# Patient Record
Sex: Male | Born: 1945
Health system: Southern US, Community
[De-identification: ages and names within clinical notes are randomized; demographics above are authoritative.]

## PROBLEM LIST (undated history)

## (undated) DIAGNOSIS — K851 Biliary acute pancreatitis without necrosis or infection: Secondary | ICD-10-CM

## (undated) DIAGNOSIS — M109 Gout, unspecified: Secondary | ICD-10-CM

## (undated) DIAGNOSIS — J45998 Other asthma: Secondary | ICD-10-CM

## (undated) DIAGNOSIS — K859 Acute pancreatitis without necrosis or infection, unspecified: Secondary | ICD-10-CM

## (undated) DIAGNOSIS — K219 Gastro-esophageal reflux disease without esophagitis: Secondary | ICD-10-CM

## (undated) DIAGNOSIS — K6389 Other specified diseases of intestine: Secondary | ICD-10-CM

## (undated) DIAGNOSIS — J302 Other seasonal allergic rhinitis: Secondary | ICD-10-CM

## (undated) HISTORY — PX: CIRCUMCISION: SUR203

## (undated) HISTORY — PX: PANCREAS SURGERY: SHX731

---

## 1988-05-22 DIAGNOSIS — M109 Gout, unspecified: Secondary | ICD-10-CM

## 1988-05-22 HISTORY — DX: Gout, unspecified: M10.9

## 2008-06-03 ENCOUNTER — Encounter: Admission: RE | Admit: 2008-06-03 | Discharge: 2008-06-03 | Payer: Self-pay | Admitting: Internal Medicine

## 2011-09-08 DIAGNOSIS — Z0000 Encounter for general adult medical examination without abnormal findings: Secondary | ICD-10-CM | POA: Diagnosis not present

## 2011-09-08 DIAGNOSIS — Z23 Encounter for immunization: Secondary | ICD-10-CM | POA: Diagnosis not present

## 2011-09-08 DIAGNOSIS — E7800 Pure hypercholesterolemia, unspecified: Secondary | ICD-10-CM | POA: Diagnosis not present

## 2011-09-08 DIAGNOSIS — Z125 Encounter for screening for malignant neoplasm of prostate: Secondary | ICD-10-CM | POA: Diagnosis not present

## 2011-09-11 DIAGNOSIS — N390 Urinary tract infection, site not specified: Secondary | ICD-10-CM | POA: Diagnosis not present

## 2012-05-22 HISTORY — PX: EYE EXAMINATION UNDER ANESTHESIA W/ RETINAL CRYOTHERAPY AND RETINAL LASER: SHX1561

## 2012-08-12 DIAGNOSIS — H538 Other visual disturbances: Secondary | ICD-10-CM | POA: Diagnosis not present

## 2012-08-13 DIAGNOSIS — H33329 Round hole, unspecified eye: Secondary | ICD-10-CM | POA: Diagnosis not present

## 2012-08-13 DIAGNOSIS — H33319 Horseshoe tear of retina without detachment, unspecified eye: Secondary | ICD-10-CM | POA: Diagnosis not present

## 2012-09-19 DIAGNOSIS — Z125 Encounter for screening for malignant neoplasm of prostate: Secondary | ICD-10-CM | POA: Diagnosis not present

## 2012-09-19 DIAGNOSIS — Z0000 Encounter for general adult medical examination without abnormal findings: Secondary | ICD-10-CM | POA: Diagnosis not present

## 2012-09-19 DIAGNOSIS — E7800 Pure hypercholesterolemia, unspecified: Secondary | ICD-10-CM | POA: Diagnosis not present

## 2012-09-20 DIAGNOSIS — H33329 Round hole, unspecified eye: Secondary | ICD-10-CM | POA: Diagnosis not present

## 2012-09-26 DIAGNOSIS — Z0000 Encounter for general adult medical examination without abnormal findings: Secondary | ICD-10-CM | POA: Diagnosis not present

## 2012-09-26 DIAGNOSIS — Z6829 Body mass index (BMI) 29.0-29.9, adult: Secondary | ICD-10-CM | POA: Diagnosis not present

## 2012-09-26 DIAGNOSIS — E7800 Pure hypercholesterolemia, unspecified: Secondary | ICD-10-CM | POA: Diagnosis not present

## 2012-09-27 DIAGNOSIS — Z0000 Encounter for general adult medical examination without abnormal findings: Secondary | ICD-10-CM | POA: Diagnosis not present

## 2012-12-04 DIAGNOSIS — M545 Low back pain, unspecified: Secondary | ICD-10-CM | POA: Diagnosis not present

## 2013-07-08 ENCOUNTER — Emergency Department (HOSPITAL_COMMUNITY): Payer: Medicare Other

## 2013-07-08 ENCOUNTER — Inpatient Hospital Stay (HOSPITAL_COMMUNITY)
Admission: EM | Admit: 2013-07-08 | Discharge: 2013-07-14 | DRG: 440 | Disposition: A | Discharge status: Home / Self Care | Payer: Medicare Other | Attending: Internal Medicine | Admitting: Internal Medicine

## 2013-07-08 ENCOUNTER — Encounter (HOSPITAL_COMMUNITY): Payer: Self-pay | Admitting: Emergency Medicine

## 2013-07-08 DIAGNOSIS — R112 Nausea with vomiting, unspecified: Secondary | ICD-10-CM | POA: Diagnosis present

## 2013-07-08 DIAGNOSIS — K5900 Constipation, unspecified: Secondary | ICD-10-CM | POA: Diagnosis not present

## 2013-07-08 DIAGNOSIS — K859 Acute pancreatitis without necrosis or infection, unspecified: Secondary | ICD-10-CM | POA: Diagnosis not present

## 2013-07-08 DIAGNOSIS — E663 Overweight: Secondary | ICD-10-CM | POA: Diagnosis present

## 2013-07-08 DIAGNOSIS — F1010 Alcohol abuse, uncomplicated: Secondary | ICD-10-CM | POA: Diagnosis present

## 2013-07-08 DIAGNOSIS — R1033 Periumbilical pain: Secondary | ICD-10-CM | POA: Diagnosis present

## 2013-07-08 DIAGNOSIS — K2970 Gastritis, unspecified, without bleeding: Secondary | ICD-10-CM | POA: Diagnosis not present

## 2013-07-08 DIAGNOSIS — R509 Fever, unspecified: Secondary | ICD-10-CM | POA: Diagnosis not present

## 2013-07-08 DIAGNOSIS — J45909 Unspecified asthma, uncomplicated: Secondary | ICD-10-CM | POA: Diagnosis present

## 2013-07-08 DIAGNOSIS — R52 Pain, unspecified: Secondary | ICD-10-CM

## 2013-07-08 DIAGNOSIS — I517 Cardiomegaly: Secondary | ICD-10-CM | POA: Diagnosis not present

## 2013-07-08 DIAGNOSIS — R10819 Abdominal tenderness, unspecified site: Secondary | ICD-10-CM | POA: Diagnosis not present

## 2013-07-08 DIAGNOSIS — R935 Abnormal findings on diagnostic imaging of other abdominal regions, including retroperitoneum: Secondary | ICD-10-CM | POA: Diagnosis present

## 2013-07-08 HISTORY — DX: Gout, unspecified: M10.9

## 2013-07-08 HISTORY — DX: Other asthma: J45.998

## 2013-07-08 HISTORY — DX: Acute pancreatitis without necrosis or infection, unspecified: K85.90

## 2013-07-08 LAB — CBC WITH DIFFERENTIAL/PLATELET
Basophils Absolute: 0 10*3/uL (ref 0.0–0.1)
Basophils Relative: 0 % (ref 0–1)
Eosinophils Absolute: 0 10*3/uL (ref 0.0–0.7)
Eosinophils Relative: 0 % (ref 0–5)
HCT: 44.6 % (ref 39.0–52.0)
Hemoglobin: 15.9 g/dL (ref 13.0–17.0)
Lymphocytes Relative: 7 % — ABNORMAL LOW (ref 12–46)
Lymphs Abs: 0.8 10*3/uL (ref 0.7–4.0)
MCH: 33.1 pg (ref 26.0–34.0)
MCHC: 35.7 g/dL (ref 30.0–36.0)
MCV: 92.9 fL (ref 78.0–100.0)
Monocytes Absolute: 0.4 10*3/uL (ref 0.1–1.0)
Monocytes Relative: 4 % (ref 3–12)
Neutro Abs: 9.7 10*3/uL — ABNORMAL HIGH (ref 1.7–7.7)
Neutrophils Relative %: 89 % — ABNORMAL HIGH (ref 43–77)
Platelets: 171 10*3/uL (ref 150–400)
RBC: 4.8 MIL/uL (ref 4.22–5.81)
RDW: 13.3 % (ref 11.5–15.5)
WBC: 10.9 10*3/uL — ABNORMAL HIGH (ref 4.0–10.5)

## 2013-07-08 LAB — URINALYSIS, ROUTINE W REFLEX MICROSCOPIC
Glucose, UA: NEGATIVE mg/dL
Hgb urine dipstick: NEGATIVE
Ketones, ur: NEGATIVE mg/dL
Leukocytes, UA: NEGATIVE
Nitrite: NEGATIVE
Protein, ur: NEGATIVE mg/dL
Specific Gravity, Urine: 1.036 — ABNORMAL HIGH (ref 1.005–1.030)
Urobilinogen, UA: 2 mg/dL — ABNORMAL HIGH (ref 0.0–1.0)
pH: 6 (ref 5.0–8.0)

## 2013-07-08 LAB — COMPREHENSIVE METABOLIC PANEL
ALT: 65 U/L — ABNORMAL HIGH (ref 0–53)
AST: 85 U/L — ABNORMAL HIGH (ref 0–37)
Albumin: 3.9 g/dL (ref 3.5–5.2)
Alkaline Phosphatase: 79 U/L (ref 39–117)
BUN: 12 mg/dL (ref 6–23)
CO2: 27 mEq/L (ref 19–32)
Calcium: 9.4 mg/dL (ref 8.4–10.5)
Chloride: 107 mEq/L (ref 96–112)
Creatinine, Ser: 1.25 mg/dL (ref 0.50–1.35)
GFR calc Af Amer: 67 mL/min — ABNORMAL LOW (ref >90)
GFR calc non Af Amer: 58 mL/min — ABNORMAL LOW (ref >90)
Glucose, Bld: 123 mg/dL — ABNORMAL HIGH (ref 70–99)
Potassium: 4.1 mEq/L (ref 3.7–5.3)
Sodium: 145 mEq/L (ref 137–147)
Total Bilirubin: 1.4 mg/dL — ABNORMAL HIGH (ref 0.3–1.2)
Total Protein: 7.7 g/dL (ref 6.0–8.3)

## 2013-07-08 LAB — LIPASE, BLOOD: Lipase: >3000 U/L — ABNORMAL HIGH (ref 11–59)

## 2013-07-08 LAB — CG4 I-STAT (LACTIC ACID): Lactic Acid, Venous: 1.5 mmol/L (ref 0.5–2.2)

## 2013-07-08 LAB — TRIGLYCERIDES: Triglycerides: 153 mg/dL — ABNORMAL HIGH (ref <150)

## 2013-07-08 MED ORDER — POTASSIUM CHLORIDE IN NACL 20-0.9 MEQ/L-% IV SOLN
INTRAVENOUS | Status: AC
  Administered 2013-07-09: 100 mL/h via INTRAVENOUS
  Filled 2013-07-08 (×2): qty 1000

## 2013-07-08 MED ORDER — ONDANSETRON HCL 4 MG PO TABS: 4.0000 mg | ORAL_TABLET | Freq: Four times a day (QID) | ORAL | Status: DC | PRN

## 2013-07-08 MED ORDER — ONDANSETRON HCL 4 MG/2ML IJ SOLN
4.0000 mg | Freq: Once | INTRAMUSCULAR | Status: AC
  Administered 2013-07-08: 4 mg via INTRAVENOUS
  Filled 2013-07-08: qty 2

## 2013-07-08 MED ORDER — MORPHINE SULFATE 4 MG/ML IJ SOLN
4.0000 mg | Freq: Once | INTRAMUSCULAR | Status: AC
  Administered 2013-07-08: 4 mg via INTRAVENOUS
  Filled 2013-07-08: qty 1

## 2013-07-08 MED ORDER — THIAMINE HCL 100 MG/ML IJ SOLN
Freq: Once | INTRAVENOUS | Status: AC
  Administered 2013-07-08: via INTRAVENOUS
  Filled 2013-07-08: qty 1000

## 2013-07-08 MED ORDER — HYDROMORPHONE HCL PF 1 MG/ML IJ SOLN
1.0000 mg | INTRAMUSCULAR | Status: DC | PRN
  Administered 2013-07-09 – 2013-07-12 (×9): 1 mg via INTRAVENOUS
  Filled 2013-07-08 (×10): qty 1

## 2013-07-08 MED ORDER — IOHEXOL 300 MG/ML  SOLN
100.0000 mL | Freq: Once | INTRAMUSCULAR | Status: AC | PRN
  Administered 2013-07-08: 100 mL via INTRAVENOUS

## 2013-07-08 MED ORDER — SODIUM CHLORIDE 0.9 % IV BOLUS (SEPSIS)
1000.0000 mL | Freq: Once | INTRAVENOUS | Status: AC
  Administered 2013-07-08: 1000 mL via INTRAVENOUS

## 2013-07-08 MED ORDER — ENOXAPARIN SODIUM 40 MG/0.4ML ~~LOC~~ SOLN
40.0000 mg | SUBCUTANEOUS | Status: DC
  Administered 2013-07-09 – 2013-07-13 (×5): 40 mg via SUBCUTANEOUS
  Filled 2013-07-08 (×6): qty 0.4

## 2013-07-08 MED ORDER — SODIUM CHLORIDE 0.9 % IV BOLUS (SEPSIS)
1000.0000 mL | Freq: Once | INTRAVENOUS | Status: AC
  Administered 2013-07-09: 1000 mL via INTRAVENOUS

## 2013-07-08 MED ORDER — ALBUTEROL SULFATE (2.5 MG/3ML) 0.083% IN NEBU
3.0000 mL | INHALATION_SOLUTION | Freq: Four times a day (QID) | RESPIRATORY_TRACT | Status: DC | PRN
  Administered 2013-07-10: 3 mL via RESPIRATORY_TRACT
  Filled 2013-07-08: qty 3

## 2013-07-08 MED ORDER — IOHEXOL 300 MG/ML  SOLN
25.0000 mL | INTRAMUSCULAR | Status: AC
  Administered 2013-07-08: 25 mL via ORAL

## 2013-07-08 MED ORDER — ONDANSETRON HCL 4 MG/2ML IJ SOLN: 4.0000 mg | Freq: Four times a day (QID) | INTRAMUSCULAR | Status: DC | PRN

## 2013-07-08 NOTE — Progress Notes (Signed)
Received report from RN. Awaiting arrival of patient

## 2013-07-08 NOTE — Progress Notes (Signed)
Called down for report. RN asked to call back. Awaiting call

## 2013-07-08 NOTE — ED Provider Notes (Signed)
CSN: [id number]     Arrival date & time 07/08/13  1737 History   First MD Initiated Contact with Patient 07/08/13 1737     Chief Complaint  Patient presents with  . Abdominal Pain     (Consider location/radiation/quality/duration/timing/severity/associated sxs/prior Treatment) Patient is a 68 y.o. male presenting with abdominal pain.  Abdominal Pain Pain location:  RLQ Pain quality: aching and squeezing   Pain radiates to:  Does not radiate Pain severity:  Moderate Onset quality:  Gradual Duration:  1 day Timing:  Constant Progression:  Worsening Context: not awakening from sleep, not diet changes, not eating, not laxative use and not previous surgeries   Relieved by: fentanyl by EMS. Worsened by:  Palpation Ineffective treatments:  Eating, cold and belching Associated symptoms: chills, nausea and vomiting ( nbnb)   Associated symptoms: no anorexia, no belching, no chest pain, no cough, no diarrhea, no dysuria, no fatigue, no fever, no flatus, no melena, no shortness of breath and no sore throat     Past Medical History  Diagnosis Date  . Seasonal asthma   . Pancreatitis, acute 07/08/2013  . Gout attack 1990   Past Surgical History  Procedure Laterality Date  . Circumcision  ~ 1974  . Eye examination under anesthesia w/ retinal cryotherapy and retinal laser Right 2014    "for tear"   History reviewed. No pertinent family history. History  Substance Use Topics  . Smoking status: Never Smoker   . Smokeless tobacco: Never Used  . Alcohol Use: 1.8 oz/week    2 Cans of beer, 1 Shots of liquor per week     Comment: 07/08/2013    Review of Systems  Constitutional: Positive for chills. Negative for fever, activity change, appetite change and fatigue.  HENT: Negative for congestion, ear pain, rhinorrhea, sinus pressure and sore throat.   Eyes: Negative for pain and redness.  Respiratory: Negative for cough, chest tightness and shortness of breath.   Cardiovascular:  Negative for chest pain and palpitations.  Gastrointestinal: Positive for nausea, vomiting ( nbnb) and abdominal pain. Negative for diarrhea, melena, abdominal distention, anorexia and flatus.  Genitourinary: Negative for dysuria, flank pain and difficulty urinating.  Musculoskeletal: Negative for back pain, neck pain and neck stiffness.  Skin: Negative for rash and wound.  Neurological: Negative for dizziness, light-headedness, numbness and headaches.  Hematological: Negative for adenopathy.  Psychiatric/Behavioral: Negative for behavioral problems, confusion and agitation.      Allergies  Review of patient's allergies indicates no known allergies.  Home Medications  No current outpatient prescriptions on file. BP 133/84  Pulse 102  Temp(Src) 99.2 F (37.3 C) (Oral)  Resp 20  Ht 6' (1.829 m)  Wt 210 lb (95.255 kg)  BMI 28.47 kg/m2  SpO2 95% Physical Exam  Constitutional: He is oriented to person, place, and time. He appears well-developed and well-nourished. No distress.  HENT:  Head: Normocephalic and atraumatic.  Nose: Nose normal.  Mouth/Throat: Oropharynx is clear and moist.  Eyes: Conjunctivae and EOM are normal. Pupils are equal, round, and reactive to light.  Neck: Normal range of motion. Neck supple. No tracheal deviation present.  Cardiovascular: Normal rate, regular rhythm, normal heart sounds and intact distal pulses.   Pulmonary/Chest: Effort normal and breath sounds normal. No respiratory distress. He has no rales.  Abdominal: Soft. Bowel sounds are normal. He exhibits no distension. There is tenderness ( RLQ). There is no rebound and no guarding.  Pain over Mcburney point. Positive obturator sign. No Rovsing's or psaos  sign  Musculoskeletal: Normal range of motion. He exhibits no edema and no tenderness.  Neurological: He is alert and oriented to person, place, and time.  Skin: Skin is warm and dry.  Psychiatric: He has a normal mood and affect. His behavior is  normal.    ED Course  Procedures  Labs Review Labs Reviewed  CBC WITH DIFFERENTIAL - Abnormal; Notable for the following:    WBC 10.9 (*)    Neutrophils Relative % 89 (*)    Neutro Abs 9.7 (*)    Lymphocytes Relative 7 (*)    All other components within normal limits  COMPREHENSIVE METABOLIC PANEL - Abnormal; Notable for the following:    Glucose, Bld 123 (*)    AST 85 (*)    ALT 65 (*)    Total Bilirubin 1.4 (*)    GFR calc non Af Amer 58 (*)    GFR calc Af Amer 67 (*)    All other components within normal limits  LIPASE, BLOOD - Abnormal; Notable for the following:    Lipase >3000 (*)    All other components within normal limits  URINALYSIS, ROUTINE W REFLEX MICROSCOPIC - Abnormal; Notable for the following:    Color, Urine AMBER (*)    Specific Gravity, Urine 1.036 (*)    Bilirubin Urine SMALL (*)    Urobilinogen, UA 2.0 (*)    All other components within normal limits  TRIGLYCERIDES - Abnormal; Notable for the following:    Triglycerides 153 (*)    All other components within normal limits  COMPREHENSIVE METABOLIC PANEL  CBC  PROTIME-INR  LIPASE, BLOOD  CG4 I-STAT (LACTIC ACID)   Imaging Review Ct Abdomen Pelvis W Contrast  07/08/2013   CLINICAL DATA:  Pain.  EXAM: CT ABDOMEN AND PELVIS WITH CONTRAST  TECHNIQUE: Multidetector CT imaging of the abdomen and pelvis was performed using the standard protocol following bolus administration of intravenous contrast.  CONTRAST:  190mL OMNIPAQUE IOHEXOL 300 MG/ML  SOLN  COMPARISON:  None.  FINDINGS: Liver normal. Spleen normal. Splenic vein and portal vein are patent. Diffuse pancreatic and peripancreatic edema noted with fluid in the anterior perirenal space diffusely. These findings are consistent with pancreatitis. No evidence of biliary distention. No evidence of gallbladder distention.  Adrenals are unremarkable. Simple left renal cyst. Kidneys are otherwise unremarkable. No evidence of hydronephrosis or obstructing ureteral  stone. Bladder is nondistended. Prostate is unremarkable.  Aorta and its branches are patent. No significant inguinal retroperitoneal adenopathy.  Appendix normal. No inflammatory changes in either right or left lower quadrant. Diffuse diverticulosis noted. No evidence of diverticulitis. No bowel distention or free air. The stomach is nondistended. Small hiatal hernia present.  Mild atelectasis and/or infiltrates noted in the lung bases. Borderline cardiomegaly. Coronary artery disease. Bilateral inguinal hernias with herniation of fat noted. Abdominal wall is otherwise intact.  IMPRESSION: 1. Pancreatitis. No evidence of pseudocyst or hemorrhage. No evidence of biliary obstruction. 2. Bibasilar atelectasis and/or infiltrates. 3. Borderline cardiomegaly.  Coronary artery disease.   Electronically Signed   By: Marcello Moores  Register   On: 07/08/2013 19:11      MDM   Final diagnoses:  Abdominal pain, acute, periumbilical  Acute pancreatitis  Nausea & vomiting     68 yo M in NAD AFVSS non toxic appearing presents with one days hx of periumbilical pain which has now localized to his RLQ. Positive obturator sign and pain over mcburneys point. Concern for appendicitis. He denies any dysuria , hematuria or flank pain.  Will obtain labs and CT C/A/P.   Lipase >3000. CT concerning for pancreatitis. Morphine and zofran given. NS hydration. Patient made NPO.   Case dsicusse dwith my attending Dr. Reather Converse.   Patient admitted for further care of pancreatitis.     Ruthell Rummage, MD 07/09/13 Dyann Kief

## 2013-07-08 NOTE — H&P (Addendum)
Chief Complaint:  abd pain  HPI: 68 yo male h/o asthma comes in with one day of abd pain started periumblical then moved to rlq with associated n/v nonbloody.  No fevers.  Still has gallbladder and appendix.  No diarrhea.  Drinks etoh about 3 x a week and had a couple of shots last night.  No medical problems except mild asthma.  No prior h/o pancreatitis.  Does not have pcp.  Feeling much better with zofran and pain meds received in ED.  No recent new medications or illnesses.  Review of Systems:  Positive and negative as per HPI otherwise all other systems are negative  Past Medical History: History reviewed. No pertinent past medical history. History reviewed. No pertinent past surgical history.  Medications: Prior to Admission medications   Medication Sig Start Date End Date Taking? Authorizing Provider  albuterol (PROVENTIL HFA;VENTOLIN HFA) 108 (90 BASE) MCG/ACT inhaler Inhale 1-2 puffs into the lungs every 6 (six) hours as needed for wheezing or shortness of breath.   Yes Historical Provider, MD  aspirin-sod bicarb-citric acid (ALKA-SELTZER) 325 MG TBEF tablet Take 325 mg by mouth daily as needed (for indigestion).   Yes Historical Provider, MD  bismuth subsalicylate (PEPTO BISMOL) 262 MG/15ML suspension Take 30 mLs by mouth every 6 (six) hours as needed for indigestion.   Yes Historical Provider, MD  loratadine (CLARITIN) 10 MG tablet Take 10 mg by mouth daily as needed for allergies.   Yes Historical Provider, MD    Allergies:  No Known Allergies  Social History:  reports that he has never smoked. He does not have any smokeless tobacco history on file. He reports that he drinks alcohol. He reports that he does not use illicit drugs.  Family History: History reviewed. No pertinent family history.  Physical Exam: Filed Vitals:   07/08/13 1815 07/08/13 1830 07/08/13 2015 07/08/13 2130  BP: 130/86 126/103 131/89 122/72  Pulse:  30 91 90  Temp:   98.9 F (37.2 C)   TempSrc:    Oral   Resp: 20 22 14    SpO2:  67% 96% 93%   General appearance: alert, cooperative and no distress Head: Normocephalic, without obvious abnormality, atraumatic Eyes: negative Nose: Nares normal. Septum midline. Mucosa normal. No drainage or sinus tenderness. Neck: no JVD and supple, symmetrical, trachea midline Lungs: clear to auscultation bilaterally Heart: regular rate and rhythm, S1, S2 normal, no murmur, click, rub or gallop Abdomen: soft ttp general, mild distention. pos bs/ no r/g nonacute abd Extremities: extremities normal, atraumatic, no cyanosis or edema Pulses: 2+ and symmetric Skin: Skin color, texture, turgor normal. No rashes or lesions Neurologic: Grossly normal    Labs on Admission:   Recent Labs  07/08/13 1742  NA 145  K 4.1  CL 107  CO2 27  GLUCOSE 123*  BUN 12  CREATININE 1.25  CALCIUM 9.4    Recent Labs  07/08/13 1742  AST 85*  ALT 65*  ALKPHOS 79  BILITOT 1.4*  PROT 7.7  ALBUMIN 3.9    Recent Labs  07/08/13 1742  LIPASE >3000*    Recent Labs  07/08/13 1742  WBC 10.9*  NEUTROABS 9.7*  HGB 15.9  HCT 44.6  MCV 92.9  PLT 171    Radiological Exams on Admission: Ct Abdomen Pelvis W Contrast  07/08/2013   CLINICAL DATA:  Pain.  EXAM: CT ABDOMEN AND PELVIS WITH CONTRAST  TECHNIQUE: Multidetector CT imaging of the abdomen and pelvis was performed using the standard protocol following bolus  administration of intravenous contrast.  CONTRAST:  183mL OMNIPAQUE IOHEXOL 300 MG/ML  SOLN  COMPARISON:  None.  FINDINGS: Liver normal. Spleen normal. Splenic vein and portal vein are patent. Diffuse pancreatic and peripancreatic edema noted with fluid in the anterior perirenal space diffusely. These findings are consistent with pancreatitis. No evidence of biliary distention. No evidence of gallbladder distention.  Adrenals are unremarkable. Simple left renal cyst. Kidneys are otherwise unremarkable. No evidence of hydronephrosis or obstructing  ureteral stone. Bladder is nondistended. Prostate is unremarkable.  Aorta and its branches are patent. No significant inguinal retroperitoneal adenopathy.  Appendix normal. No inflammatory changes in either right or left lower quadrant. Diffuse diverticulosis noted. No evidence of diverticulitis. No bowel distention or free air. The stomach is nondistended. Small hiatal hernia present.  Mild atelectasis and/or infiltrates noted in the lung bases. Borderline cardiomegaly. Coronary artery disease. Bilateral inguinal hernias with herniation of fat noted. Abdominal wall is otherwise intact.  IMPRESSION: 1. Pancreatitis. No evidence of pseudocyst or hemorrhage. No evidence of biliary obstruction. 2. Bibasilar atelectasis and/or infiltrates. 3. Borderline cardiomegaly.  Coronary artery disease.   Electronically Signed   By: Marcello Moores  Register   On: 07/08/2013 19:11    Assessment/Plan  68 yo male with first episode of acute pancreatitis etoh induced vs gallstone pancreatitis  Principal Problem:   Acute pancreatitis- conservative tx.  Give 2 more liters ivf, gotten one already.  Trig level sent off.  Will also obtain abd u/s.  Repeat lfts and lipase in am.  Iv dilaudid and zofran prn.  Npo.  Active Problems:   Abdominal pain, acute, periumbilical   Nausea & vomiting    Dalen,Jermain Curt A 07/08/2013, 10:07 PM   Revaluated pt earlier this am, abd exam is no worse than before, mildly distended, diffuse ttp good bs no r/g nonacute.

## 2013-07-08 NOTE — ED Notes (Signed)
Pt arrived by [facility] for mid abd pain that has been getting progressively worse throughout the day. Now radiates more to RLQ pain and n/v x 2. Received 188mcg fentanyl iv pta.

## 2013-07-08 NOTE — Progress Notes (Signed)
  Pt admitted to the unit. Pt is stable, alert and oriented per baseline. Oriented to room, staff, and call bell. Educated to call for any assistance. Bed in lowest position, call bell within reach- will continue to monitor. 

## 2013-07-09 ENCOUNTER — Inpatient Hospital Stay (HOSPITAL_COMMUNITY): Payer: Medicare Other

## 2013-07-09 DIAGNOSIS — R935 Abnormal findings on diagnostic imaging of other abdominal regions, including retroperitoneum: Secondary | ICD-10-CM | POA: Diagnosis present

## 2013-07-09 DIAGNOSIS — K859 Acute pancreatitis without necrosis or infection, unspecified: Secondary | ICD-10-CM | POA: Diagnosis not present

## 2013-07-09 DIAGNOSIS — R112 Nausea with vomiting, unspecified: Secondary | ICD-10-CM | POA: Diagnosis not present

## 2013-07-09 DIAGNOSIS — R1033 Periumbilical pain: Secondary | ICD-10-CM | POA: Diagnosis not present

## 2013-07-09 LAB — LIPASE, BLOOD: Lipase: 987 U/L — ABNORMAL HIGH (ref 11–59)

## 2013-07-09 LAB — CBC
HCT: 42.4 % (ref 39.0–52.0)
Hemoglobin: 14.7 g/dL (ref 13.0–17.0)
MCH: 32.3 pg (ref 26.0–34.0)
MCHC: 34.7 g/dL (ref 30.0–36.0)
MCV: 93.2 fL (ref 78.0–100.0)
Platelets: 153 10*3/uL (ref 150–400)
RBC: 4.55 MIL/uL (ref 4.22–5.81)
RDW: 13.7 % (ref 11.5–15.5)
WBC: 10.8 10*3/uL — ABNORMAL HIGH (ref 4.0–10.5)

## 2013-07-09 LAB — COMPREHENSIVE METABOLIC PANEL
ALT: 65 U/L — ABNORMAL HIGH (ref 0–53)
AST: 64 U/L — ABNORMAL HIGH (ref 0–37)
Albumin: 3.1 g/dL — ABNORMAL LOW (ref 3.5–5.2)
Alkaline Phosphatase: 62 U/L (ref 39–117)
BUN: 13 mg/dL (ref 6–23)
CO2: 19 mEq/L (ref 19–32)
Calcium: 7.9 mg/dL — ABNORMAL LOW (ref 8.4–10.5)
Chloride: 110 mEq/L (ref 96–112)
Creatinine, Ser: 1.11 mg/dL (ref 0.50–1.35)
GFR calc Af Amer: 77 mL/min — ABNORMAL LOW (ref >90)
GFR calc non Af Amer: 67 mL/min — ABNORMAL LOW (ref >90)
Glucose, Bld: 113 mg/dL — ABNORMAL HIGH (ref 70–99)
Potassium: 4 mEq/L (ref 3.7–5.3)
Sodium: 141 mEq/L (ref 137–147)
Total Bilirubin: 1.1 mg/dL (ref 0.3–1.2)
Total Protein: 6.3 g/dL (ref 6.0–8.3)

## 2013-07-09 LAB — PROTIME-INR
INR: 1.19 (ref 0.00–1.49)
Prothrombin Time: 14.8 seconds (ref 11.6–15.2)

## 2013-07-09 MED ORDER — SINCALIDE 5 MCG IJ SOLR
INTRAMUSCULAR | Status: AC
  Administered 2013-07-09: 1.9 ug via INTRAVENOUS
  Filled 2013-07-09: qty 5

## 2013-07-09 MED ORDER — SINCALIDE 5 MCG IJ SOLR
0.0200 ug/kg | Freq: Once | INTRAMUSCULAR | Status: AC
  Administered 2013-07-09: 1.9 ug via INTRAVENOUS
  Filled 2013-07-09: qty 5

## 2013-07-09 MED ORDER — ACETAMINOPHEN 40 MG HALF SUPP
40.0000 mg | Freq: Four times a day (QID) | RECTAL | Status: DC
  Administered 2013-07-09 – 2013-07-10 (×2): 40 mg via RECTAL
  Filled 2013-07-09 (×11): qty 1

## 2013-07-09 MED ORDER — TECHNETIUM TC 99M MEBROFENIN IV KIT
5.0000 | PACK | Freq: Once | INTRAVENOUS | Status: AC | PRN
  Administered 2013-07-09: 5 via INTRAVENOUS

## 2013-07-09 NOTE — Progress Notes (Signed)
RN received call concerning patient's morning labs. Patient's blood clotted off. Labs will need to be retaken.

## 2013-07-09 NOTE — Progress Notes (Signed)
UR completed. Guadalupe Kerekes RN CCM Case Mgmt phone [phone] 

## 2013-07-09 NOTE — Progress Notes (Signed)
[facility] PROGRESS NOTE  Mason [name] [id number]:[id number] DOB: September 02, 1945 DOA: 07/08/2013 PCP: Myriam Jacobson, MD  Assessment/Plan: Principal Problem:   Acute pancreatitis: Patient and wife, alcohol intake is minimal. Given findings of gallbladder wall thickening on ultrasound, suspect this is gallbladder related. Ordered HIDA scan. Awaiting lipid panel. Lipase levels down into 900s. Patient clinically improving. Continue IV fluids, nausea and pain medication Active Problems:   Abdominal pain, acute, periumbilical: Secondary pancreatitis   Nausea & vomiting: Secondary pancreatitis  Code Status: Full code Family Communication: Spoke with patient's wife at the bedside.  Disposition Plan: Here for a few more days until lipase level normalized and patient can eat   Consultants:  None  Procedures:  HIDA scan pending  Antibiotics:  None   HPI/Subjective: Patient complains of mild midepigastric abdominal pain with little radiation to the back. Much better than yesterday. No other complaints.  Objective: Filed Vitals:   07/09/13 0531  BP: 122/72  Pulse: 81  Temp: 99 F (37.2 C)  Resp: 20   No intake or output data in the 24 hours ending 07/09/13 1142 Filed Weights   07/08/13 2320  Weight: 95.255 kg (210 lb)    Exam:   General:  Alert and oriented x3, no acute distress  Cardiovascular: Regular rate and rhythm, S1-S2  Respiratory: Clear auscultation bilaterally  Abdomen: Soft, minimal distention, nonspecific generalized tenderness  Musculoskeletal: No clubbing or cyanosis or edema   Data Reviewed: Basic Metabolic Panel:  Recent Labs Lab 07/08/13 1742 07/09/13 0515  NA 145 141  K 4.1 4.0  CL 107 110  CO2 27 19  GLUCOSE 123* 113*  BUN 12 13  CREATININE 1.25 1.11  CALCIUM 9.4 7.9*   Liver Function Tests:  Recent Labs Lab 07/08/13 1742 07/09/13 0515  AST 85* 64*  ALT 65* 65*  ALKPHOS 79 62  BILITOT 1.4* 1.1  PROT 7.7 6.3  ALBUMIN 3.9  3.1*    Recent Labs Lab 07/08/13 1742 07/09/13 0515  LIPASE >3000* 987*   No results found for this basename: AMMONIA,  in the last 168 hours CBC:  Recent Labs Lab 07/08/13 1742 07/09/13 0715  WBC 10.9* 10.8*  NEUTROABS 9.7*  --   HGB 15.9 14.7  HCT 44.6 42.4  MCV 92.9 93.2  PLT 171 153   Cardiac Enzymes: No results found for this basename: CKTOTAL, CKMB, CKMBINDEX, TROPONINI,  in the last 168 hours BNP (last 3 results) No results found for this basename: PROBNP,  in the last 8760 hours CBG: No results found for this basename: GLUCAP,  in the last 168 hours  No results found for this or any previous visit (from the past 240 hour(s)).   Studies: US Abdomen Complete  07/09/2013   CLINICAL DATA:  Abdominal pain, pancreatitis  EXAM: ULTRASOUND ABDOMEN COMPLETE  COMPARISON:  CT abdomen pelvis dated 07/08/2013  FINDINGS: Gallbladder:  Gallbladder wall thickening/edema, measuring up to 4 mm. No gallstones or pericholecystic fluid. Negative sonographic Murphy's sign.  Common bile duct:  Diameter: 5 mm.  Liver:  No focal lesion identified. Within normal limits in parenchymal echogenicity.  IVC:  Poorly visualized.  Pancreas:  Visualized portions of the pancreatic head and body are grossly unremarkable.  Spleen:  Measures 5.7 cm.  Right Kidney:  Length: 9.6 cm. Trace perinephric fluid along the lower pole. No mass or hydronephrosis.  Left Kidney:  Length: 10.5 cm. 11 x 15 x 17 mm lower pole cyst. No hydronephrosis.  Abdominal aorta:  No aneurysm visualized.  Other  findings:  None.  IMPRESSION: Visualized pancreas is grossly unremarkable by ultrasound in this patient with acute pancreatitis.  Suspected secondary gallbladder wall thickening/edema. No evidence of cholelithiasis or acute cholecystitis.  Common duct measures 5 mm, within normal limits.   Electronically Signed   By: Julian Hy M.D.   On: 07/09/2013 08:11   Ct Abdomen Pelvis W Contrast  07/08/2013   CLINICAL DATA:  Pain.   EXAM: CT ABDOMEN AND PELVIS WITH CONTRAST  TECHNIQUE: Multidetector CT imaging of the abdomen and pelvis was performed using the standard protocol following bolus administration of intravenous contrast.  CONTRAST:  154mL OMNIPAQUE IOHEXOL 300 MG/ML  SOLN  COMPARISON:  None.  FINDINGS: Liver normal. Spleen normal. Splenic vein and portal vein are patent. Diffuse pancreatic and peripancreatic edema noted with fluid in the anterior perirenal space diffusely. These findings are consistent with pancreatitis. No evidence of biliary distention. No evidence of gallbladder distention.  Adrenals are unremarkable. Simple left renal cyst. Kidneys are otherwise unremarkable. No evidence of hydronephrosis or obstructing ureteral stone. Bladder is nondistended. Prostate is unremarkable.  Aorta and its branches are patent. No significant inguinal retroperitoneal adenopathy.  Appendix normal. No inflammatory changes in either right or left lower quadrant. Diffuse diverticulosis noted. No evidence of diverticulitis. No bowel distention or free air. The stomach is nondistended. Small hiatal hernia present.  Mild atelectasis and/or infiltrates noted in the lung bases. Borderline cardiomegaly. Coronary artery disease. Bilateral inguinal hernias with herniation of fat noted. Abdominal wall is otherwise intact.  IMPRESSION: 1. Pancreatitis. No evidence of pseudocyst or hemorrhage. No evidence of biliary obstruction. 2. Bibasilar atelectasis and/or infiltrates. 3. Borderline cardiomegaly.  Coronary artery disease.   Electronically Signed   By: Marcello Moores  Register   On: 07/08/2013 19:11    Scheduled Meds: . enoxaparin (LOVENOX) injection  40 mg Subcutaneous Q24H   Continuous Infusions:   Principal Problem:   Acute pancreatitis Active Problems:   Abdominal pain, acute, periumbilical   Nausea & vomiting    Time spent: 25 minutes    [facility] Pager [phone]. If 7PM-7AM, please contact  night-coverage at [email or web address], password [facility] 07/09/2013, 11:42 AM  LOS: 1 day

## 2013-07-09 NOTE — ED Provider Notes (Signed)
Medical screening examination/treatment/procedure(s) were conducted as a shared visit with non-physician practitioner(s) or resident  and myself.  I personally evaluated the patient during the encounter and agree with the findings and plan unless otherwise indicated.    I have personally reviewed any xrays and/ or EKG's with the provider and I agree with interpretation.   Worsening central abdo pain that is now right mid abdomen.  Concern for appy vs pancreatitis vs SBO vs other. Moderate right mid abdominal pain, no guarding.  Lipase elevated, CT no abscess or stones.   Admitted to medicine.   Labs Reviewed  CBC WITH DIFFERENTIAL - Abnormal; Notable for the following:    WBC 10.9 (*)    Neutrophils Relative % 89 (*)    Neutro Abs 9.7 (*)    Lymphocytes Relative 7 (*)    All other components within normal limits  COMPREHENSIVE METABOLIC PANEL - Abnormal; Notable for the following:    Glucose, Bld 123 (*)    AST 85 (*)    ALT 65 (*)    Total Bilirubin 1.4 (*)    GFR calc non Af Amer 58 (*)    GFR calc Af Amer 67 (*)    All other components within normal limits  LIPASE, BLOOD - Abnormal; Notable for the following:    Lipase >3000 (*)    All other components within normal limits  URINALYSIS, ROUTINE W REFLEX MICROSCOPIC - Abnormal; Notable for the following:    Color, Urine AMBER (*)    Specific Gravity, Urine 1.036 (*)    Bilirubin Urine SMALL (*)    Urobilinogen, UA 2.0 (*)    All other components within normal limits  TRIGLYCERIDES - Abnormal; Notable for the following:    Triglycerides 153 (*)    All other components within normal limits  COMPREHENSIVE METABOLIC PANEL  CBC  PROTIME-INR  LIPASE, BLOOD  CG4 I-STAT (LACTIC ACID)   Acute pancreatitis, Acute abdominal pain  Mariea Clonts, MD 07/09/13 [phone]

## 2013-07-10 ENCOUNTER — Inpatient Hospital Stay (HOSPITAL_COMMUNITY): Payer: Medicare Other

## 2013-07-10 DIAGNOSIS — I517 Cardiomegaly: Secondary | ICD-10-CM | POA: Diagnosis not present

## 2013-07-10 DIAGNOSIS — R1033 Periumbilical pain: Secondary | ICD-10-CM | POA: Diagnosis not present

## 2013-07-10 DIAGNOSIS — R112 Nausea with vomiting, unspecified: Secondary | ICD-10-CM | POA: Diagnosis not present

## 2013-07-10 DIAGNOSIS — K859 Acute pancreatitis without necrosis or infection, unspecified: Secondary | ICD-10-CM | POA: Diagnosis not present

## 2013-07-10 DIAGNOSIS — J9819 Other pulmonary collapse: Secondary | ICD-10-CM | POA: Diagnosis not present

## 2013-07-10 LAB — BASIC METABOLIC PANEL
BUN: 16 mg/dL (ref 6–23)
CO2: 22 mEq/L (ref 19–32)
Calcium: 8.2 mg/dL — ABNORMAL LOW (ref 8.4–10.5)
Chloride: 107 mEq/L (ref 96–112)
Creatinine, Ser: 1.23 mg/dL (ref 0.50–1.35)
GFR calc Af Amer: 68 mL/min — ABNORMAL LOW (ref >90)
GFR calc non Af Amer: 59 mL/min — ABNORMAL LOW (ref >90)
Glucose, Bld: 75 mg/dL (ref 70–99)
Potassium: 3.6 mEq/L — ABNORMAL LOW (ref 3.7–5.3)
Sodium: 143 mEq/L (ref 137–147)

## 2013-07-10 LAB — URINALYSIS, ROUTINE W REFLEX MICROSCOPIC
Glucose, UA: NEGATIVE mg/dL
Ketones, ur: 40 mg/dL — AB
Leukocytes, UA: NEGATIVE
Nitrite: NEGATIVE
Protein, ur: 30 mg/dL — AB
Specific Gravity, Urine: 1.027 (ref 1.005–1.030)
Urobilinogen, UA: 1 mg/dL (ref 0.0–1.0)
pH: 6 (ref 5.0–8.0)

## 2013-07-10 LAB — URINE MICROSCOPIC-ADD ON

## 2013-07-10 LAB — LIPASE, BLOOD: Lipase: 189 U/L — ABNORMAL HIGH (ref 11–59)

## 2013-07-10 LAB — PRO B NATRIURETIC PEPTIDE: Pro B Natriuretic peptide (BNP): 208.5 pg/mL — ABNORMAL HIGH (ref 0–125)

## 2013-07-10 MED ORDER — METRONIDAZOLE 500 MG PO TABS
500.0000 mg | ORAL_TABLET | Freq: Three times a day (TID) | ORAL | Status: DC
  Administered 2013-07-10 – 2013-07-12 (×5): 500 mg via ORAL
  Filled 2013-07-10 (×9): qty 1

## 2013-07-10 MED ORDER — BISACODYL 10 MG RE SUPP
10.0000 mg | Freq: Once | RECTAL | Status: AC
  Administered 2013-07-10: 10 mg via RECTAL
  Filled 2013-07-10: qty 1

## 2013-07-10 MED ORDER — ACETAMINOPHEN 325 MG PO TABS
650.0000 mg | ORAL_TABLET | Freq: Four times a day (QID) | ORAL | Status: DC | PRN
  Administered 2013-07-10 – 2013-07-11 (×3): 650 mg via ORAL
  Filled 2013-07-10 (×3): qty 2

## 2013-07-10 MED ORDER — CIPROFLOXACIN HCL 500 MG PO TABS
500.0000 mg | ORAL_TABLET | Freq: Two times a day (BID) | ORAL | Status: DC
  Administered 2013-07-10 – 2013-07-11 (×3): 500 mg via ORAL
  Filled 2013-07-10 (×6): qty 1

## 2013-07-10 NOTE — Progress Notes (Signed)
Pt presenting with fever of 101.5 orally. MD notified and tylenol prn given. MD also ordered chest xray. Follow up temp was 100.6 orally. Will continue to monitor per MD orders.

## 2013-07-10 NOTE — Progress Notes (Signed)
PT with elevated temp of 101.8. Pt given 650 mg of tylenol po. Pt temp down to 98.

## 2013-07-10 NOTE — Progress Notes (Signed)
[facility] PROGRESS NOTE  Mason [name] [id number]:[id number] DOB: 05/24/1945 DOA: 07/08/2013 PCP: Myriam Jacobson, MD  Assessment/Plan: Principal Problem:   Acute pancreatitis: Alcohol intake moderate, more than what the patient's wife obese. Have counseled patient on this.. Given findings of gallbladder wall thickening on ultrasound, suspect this is gallbladder related, HIDA scan notes decreased ejection fraction. Triglycerides unimpressive. Lipase levels down further at 187 down from 900s yesterday. We'll start clear liquids this evening. Patient clinically improving. Continue IV fluids, nausea and pain medication Active Problems:   Abdominal pain, acute, periumbilical: Secondary pancreatitis   Nausea & vomiting: Secondary pancreatitis Cardiomegaly: BNP checked and was normal.  Should be monitored/followed as outpatient. Patient's blood pressure has been stable during this hospitalization   Code Status: Full code Family Communication: Spoke with patient's wife yesterday  Disposition Plan:  start clears this evening, advance as tolerated, likely home tomorrow. We will discuss gallbladder with surgery to see if outpatient followup needed   Consultants:  None  Procedures:  HIDA scan  decreased ejection fraction of 8%  Antibiotics:  None   HPI/Subjective: Patient feeling much better today. Pain free.   Objective: Filed Vitals:   07/10/13 0644  BP: 116/76  Pulse: 102  Temp:   Resp:    No intake or output data in the 24 hours ending 07/10/13 1058 Filed Weights   07/08/13 2320  Weight: 95.255 kg (210 lb)    Exam:   General:  Alert and oriented x3, no acute distress  Cardiovascular: Regular rate and rhythm, S1-S2  Respiratory: Clear auscultation bilaterally  Abdomen: Soft, minimal distention,   Musculoskeletal: No clubbing or cyanosis or edema   Data Reviewed: Basic Metabolic Panel:  Recent Labs Lab 07/08/13 1742 07/09/13 0515 07/10/13 0534  NA 145  141 143  K 4.1 4.0 3.6*  CL 107 110 107  CO2 27 19 22   GLUCOSE 123* 113* 75  BUN 12 13 16   CREATININE 1.25 1.11 1.23  CALCIUM 9.4 7.9* 8.2*   Liver Function Tests:  Recent Labs Lab 07/08/13 1742 07/09/13 0515  AST 85* 64*  ALT 65* 65*  ALKPHOS 79 62  BILITOT 1.4* 1.1  PROT 7.7 6.3  ALBUMIN 3.9 3.1*    Recent Labs Lab 07/08/13 1742 07/09/13 0515 07/10/13 0534  LIPASE >3000* 987* 189*   No results found for this basename: AMMONIA,  in the last 168 hours CBC:  Recent Labs Lab 07/08/13 1742 07/09/13 0715  WBC 10.9* 10.8*  NEUTROABS 9.7*  --   HGB 15.9 14.7  HCT 44.6 42.4  MCV 92.9 93.2  PLT 171 153   Cardiac Enzymes: No results found for this basename: CKTOTAL, CKMB, CKMBINDEX, TROPONINI,  in the last 168 hours BNP (last 3 results)  Recent Labs  07/10/13 0903  PROBNP 208.5*   CBG: No results found for this basename: GLUCAP,  in the last 168 hours  No results found for this or any previous visit (from the past 240 hour(s)).   Studies: US Abdomen Complete  07/09/2013   CLINICAL DATA:  Abdominal pain, pancreatitis  EXAM: ULTRASOUND ABDOMEN COMPLETE  COMPARISON:  CT abdomen pelvis dated 07/08/2013  FINDINGS: Gallbladder:  Gallbladder wall thickening/edema, measuring up to 4 mm. No gallstones or pericholecystic fluid. Negative sonographic Murphy's sign.  Common bile duct:  Diameter: 5 mm.  Liver:  No focal lesion identified. Within normal limits in parenchymal echogenicity.  IVC:  Poorly visualized.  Pancreas:  Visualized portions of the pancreatic head and body are grossly unremarkable.  Spleen:  Measures 5.7 cm.  Right Kidney:  Length: 9.6 cm. Trace perinephric fluid along the lower pole. No mass or hydronephrosis.  Left Kidney:  Length: 10.5 cm. 11 x 15 x 17 mm lower pole cyst. No hydronephrosis.  Abdominal aorta:  No aneurysm visualized.  Other findings:  None.  IMPRESSION: Visualized pancreas is grossly unremarkable by ultrasound in this patient with acute  pancreatitis.  Suspected secondary gallbladder wall thickening/edema. No evidence of cholelithiasis or acute cholecystitis.  Common duct measures 5 mm, within normal limits.   Electronically Signed   By: Julian Hy M.D.   On: 07/09/2013 08:11   Ct Abdomen Pelvis W Contrast  07/08/2013   CLINICAL DATA:  Pain.  EXAM: CT ABDOMEN AND PELVIS WITH CONTRAST  TECHNIQUE: Multidetector CT imaging of the abdomen and pelvis was performed using the standard protocol following bolus administration of intravenous contrast.  CONTRAST:  120mL OMNIPAQUE IOHEXOL 300 MG/ML  SOLN  COMPARISON:  None.  FINDINGS: Liver normal. Spleen normal. Splenic vein and portal vein are patent. Diffuse pancreatic and peripancreatic edema noted with fluid in the anterior perirenal space diffusely. These findings are consistent with pancreatitis. No evidence of biliary distention. No evidence of gallbladder distention.  Adrenals are unremarkable. Simple left renal cyst. Kidneys are otherwise unremarkable. No evidence of hydronephrosis or obstructing ureteral stone. Bladder is nondistended. Prostate is unremarkable.  Aorta and its branches are patent. No significant inguinal retroperitoneal adenopathy.  Appendix normal. No inflammatory changes in either right or left lower quadrant. Diffuse diverticulosis noted. No evidence of diverticulitis. No bowel distention or free air. The stomach is nondistended. Small hiatal hernia present.  Mild atelectasis and/or infiltrates noted in the lung bases. Borderline cardiomegaly. Coronary artery disease. Bilateral inguinal hernias with herniation of fat noted. Abdominal wall is otherwise intact.  IMPRESSION: 1. Pancreatitis. No evidence of pseudocyst or hemorrhage. No evidence of biliary obstruction. 2. Bibasilar atelectasis and/or infiltrates. 3. Borderline cardiomegaly.  Coronary artery disease.   Electronically Signed   By: Marcello Moores  Register   On: 07/08/2013 19:11   Nm Hepato W/eject Fract  07/09/2013    CLINICAL DATA:  Abdominal pain, nausea and vomiting. Severe pancreatitis.  EXAM: NUCLEAR MEDICINE HEPATOBILIARY IMAGING WITH GALLBLADDER EF  TECHNIQUE: Sequential images of the abdomen were obtained out to 60 minutes following intravenous administration of radiopharmaceutical. After slow intravenous infusion of 1.91 micrograms Cholecystokinin, gallbladder ejection fraction was determined.  COMPARISON:  Ultrasound 07/09/2013  RADIOPHARMACEUTICALS:  5.0 mCi technetium Choletec  FINDINGS: There is symmetric uptake in the liver and prompt excretion to the biliary tree which is visualized by 10 min. The gallbladder is visualized at 15 min. Activity is noted in the small bowel at 35 min.  Gallbladder ejection fraction was calculated at 8.5%. Normal is greater than 30%.  IMPRESSION: Normal biliary patency study.  Low gallbladder ejection fraction at 8.5%.   Electronically Signed   By: Kalman Jewels M.D.   On: 07/09/2013 14:44    Scheduled Meds: . acetaminophen  40 mg Rectal Q6H  . enoxaparin (LOVENOX) injection  40 mg Subcutaneous Q24H   Continuous Infusions:   Principal Problem:   Acute pancreatitis Active Problems:   Abdominal pain, acute, periumbilical   Nausea & vomiting    Time spent: 25 minutes    [facility] Pager [phone]. If 7PM-7AM, please contact night-coverage at [email or web address], password [facility] 07/10/2013, 10:58 AM  LOS: 2 days

## 2013-07-11 ENCOUNTER — Inpatient Hospital Stay (HOSPITAL_COMMUNITY): Payer: Medicare Other

## 2013-07-11 DIAGNOSIS — N289 Disorder of kidney and ureter, unspecified: Secondary | ICD-10-CM | POA: Diagnosis not present

## 2013-07-11 DIAGNOSIS — K5900 Constipation, unspecified: Secondary | ICD-10-CM | POA: Diagnosis not present

## 2013-07-11 DIAGNOSIS — R112 Nausea with vomiting, unspecified: Secondary | ICD-10-CM | POA: Diagnosis not present

## 2013-07-11 DIAGNOSIS — R509 Fever, unspecified: Secondary | ICD-10-CM | POA: Diagnosis not present

## 2013-07-11 DIAGNOSIS — K859 Acute pancreatitis without necrosis or infection, unspecified: Secondary | ICD-10-CM | POA: Diagnosis not present

## 2013-07-11 LAB — LIPASE, BLOOD: Lipase: 49 U/L (ref 11–59)

## 2013-07-11 MED ORDER — IOHEXOL 300 MG/ML  SOLN
100.0000 mL | Freq: Once | INTRAMUSCULAR | Status: AC | PRN
  Administered 2013-07-11: 100 mL via INTRAVENOUS

## 2013-07-11 MED ORDER — IOHEXOL 300 MG/ML  SOLN: 25.0000 mL | INTRAMUSCULAR | Status: AC

## 2013-07-11 MED ORDER — POLYETHYLENE GLYCOL 3350 17 G PO PACK
17.0000 g | PACK | Freq: Every day | ORAL | Status: DC
  Administered 2013-07-11 – 2013-07-13 (×3): 17 g via ORAL
  Filled 2013-07-11 (×4): qty 1

## 2013-07-11 MED ORDER — FUROSEMIDE 20 MG PO TABS
20.0000 mg | ORAL_TABLET | Freq: Once | ORAL | Status: AC
  Administered 2013-07-11: 20 mg via ORAL
  Filled 2013-07-11: qty 1

## 2013-07-11 NOTE — Progress Notes (Signed)
Patient had fever of 101- given PRN tylenol. Temp has come down to 99.0. Will continue to monitor

## 2013-07-11 NOTE — Progress Notes (Signed)
[facility] PROGRESS NOTE  Mason [name] [id number]:[id number] DOB: 12-01-1945 DOA: 07/08/2013 PCP: Myriam Jacobson, MD  Assessment/Plan: Principal Problem:   Acute pancreatitis: Alcohol intake moderate, more than what the patient's wife believes. Have counseled patient on this.. Given findings of gallbladder wall thickening on ultrasound, suspect this is gallbladder related, HIDA scan notes decreased ejection fraction. Discussed this case with surgery who recommended that gallbladder be taken out, which can be done as outpatient. Triglycerides unimpressive. Lipase levels normal today so started clear liquids for last night and advanced to full. Patient having some abdominal comfort with solid food so will continue on solids and recheck labs in the morning.  Continue nausea and pain medication Active Problems:   Abdominal pain, acute, periumbilical: Secondary pancreatitis   Nausea & vomiting: Secondary pancreatitis  Cardiomegaly: BNP checked and was normal.  Should be monitored/followed as outpatient. Patient's blood pressure has been stable during this hospitalization. Patient had some mild pleural effusions, likely from IV fluid infusion. Give one dose of Lasix was stopped IV fluids.  Fever: Patient elevated temperatures, so started Cipro and Flagyl. No fever since. With some persistent discomfort and temperatures and no other source-urine chest x-ray negative, recheck abdominal CT today which notes some peripancreatic inflammation, but improving.  Constipation: Patient had a bowel movement for several days likely from minimal by mouth intake. Abdominal CT did not note large stool burden  Code Status: Full code Family Communication: Spoke with patient's wife by bedside today.  Disposition Plan:  Recheck lipase and keep on solids. Home tomorrow   Consultants:  None  Procedures:  HIDA scan  decreased ejection fraction of 8%  Antibiotics:  None   HPI/Subjective: Patient feeling  okay. Some mild abdominal discomfort with eating solid food. No bowel movement for 2 days, soon after MiraLAX given, had small bowel movement.   Objective: Filed Vitals:   07/11/13 1423  BP: 127/72  Pulse: 106  Temp: 99.6 F (37.6 C)  Resp: 18   No intake or output data in the 24 hours ending 07/11/13 1905 Filed Weights   07/08/13 2320  Weight: 95.255 kg (210 lb)    Exam:   General:  Alert and oriented x3, no acute distress  Cardiovascular: Regular rate and rhythm, S1-S2  Respiratory: Clear auscultation bilaterally  Abdomen: Soft, minimal distention, minimal tenderness  Musculoskeletal: No clubbing or cyanosis or edema   Data Reviewed: Basic Metabolic Panel:  Recent Labs Lab 07/08/13 1742 07/09/13 0515 07/10/13 0534  NA 145 141 143  K 4.1 4.0 3.6*  CL 107 110 107  CO2 27 19 22   GLUCOSE 123* 113* 75  BUN 12 13 16   CREATININE 1.25 1.11 1.23  CALCIUM 9.4 7.9* 8.2*   Liver Function Tests:  Recent Labs Lab 07/08/13 1742 07/09/13 0515  AST 85* 64*  ALT 65* 65*  ALKPHOS 79 62  BILITOT 1.4* 1.1  PROT 7.7 6.3  ALBUMIN 3.9 3.1*    Recent Labs Lab 07/08/13 1742 07/09/13 0515 07/10/13 0534 07/11/13 0524  LIPASE >3000* 987* 189* 49   No results found for this basename: AMMONIA,  in the last 168 hours CBC:  Recent Labs Lab 07/08/13 1742 07/09/13 0715  WBC 10.9* 10.8*  NEUTROABS 9.7*  --   HGB 15.9 14.7  HCT 44.6 42.4  MCV 92.9 93.2  PLT 171 153   Cardiac Enzymes: No results found for this basename: CKTOTAL, CKMB, CKMBINDEX, TROPONINI,  in the last 168 hours BNP (last 3 results)  Recent Labs  07/10/13 0903  PROBNP 208.5*   CBG: No results found for this basename: GLUCAP,  in the last 168 hours  No results found for this or any previous visit (from the past 240 hour(s)).   Studies: Dg Chest 2 View  07/10/2013   CLINICAL DATA:  68 year old male fever abdominal pain shortness of Breath. Initial encounter.  EXAM: CHEST  2 VIEW   COMPARISON:  Abdomen CT 07/08/2013.  FINDINGS: Low lung volumes with streaky bibasilar opacity most resembling atelectasis. Normal cardiac size and mediastinal contours. Visualized tracheal air column is within normal limits. No pneumothorax or pulmonary edema. Possible small pleural effusions. Oral contrast in nondilated bowel in the right upper quadrant. No acute osseous abnormality identified.  IMPRESSION: 1. Low lung volumes with bibasilar atelectasis. 2. Possible small pleural effusions.   Electronically Signed   By: Lars Pinks M.D.   On: 07/10/2013 18:34   Ct Abdomen W Contrast  07/11/2013   CLINICAL DATA:  Fever and known pancreatitis  EXAM: CT ABDOMEN WITH CONTRAST  TECHNIQUE: Multidetector CT imaging of the abdomen was performed using the standard protocol following bolus administration of intravenous contrast.  CONTRAST:  156mL OMNIPAQUE IOHEXOL 300 MG/ML  SOLN  COMPARISON:  NM HEPATO W/GB/PHARM/QUAN MEASUR dated 07/09/2013; CT ABD/PELVIS W CM dated 07/08/2013; US ABDOMEN COMPLETE dated 07/09/2013  FINDINGS: Peripancreatic inflammatory changes are again demonstrated and appear little changed. The volume of peripancreatic fluid however has decreased. There is no pancreatic ductal dilation. The pancreatic head is edematous and heterogeneous in density, but a discrete mass is not demonstrated.  The liver exhibits no focal mass nor ductal dilation. The gallbladder is adequately distended and exhibits no evidence of stones or significant wall thickening or pericholecystic fluid. The spleen, partially distended stomach, adrenal glands, and kidneys exhibit no acute abnormality. There is a stable hypodensity in the midpole of the left kidney with HU measurement of + 11 consistent with a cyst. The caliber of the abdominal aorta is normal.  There is mild edema of the transverse portion of the duodenum but no evidence of obstruction. The jejunum and ileum exhibit a normal caliber where visualized. The stool and  contrast and gas pattern within the visualized portions of the colon is normal.  Since the previous study there have developed small bilateral pleural effusions greater on the right than on the left. There is new bibasilar atelectasis. The cardiac chambers are top-normal in size. The observed portions of the lumbar spine appear normal.  IMPRESSION: 1. The peripancreatic inflammatory changes persist but the volume of peripancreatic fluid has decreased. 2. New small bilateral pleural effusions with bibasilar atelectasis have developed. 3. No acute abnormality of the liver or gallbladder is demonstrated. 4. No acute abnormality of the visualized portions of the bowel is demonstrated.   Electronically Signed   By: Yassir  Martinique   On: 07/11/2013 14:22    Scheduled Meds: . ciprofloxacin  500 mg Oral BID  . enoxaparin (LOVENOX) injection  40 mg Subcutaneous Q24H  . furosemide  20 mg Oral Once  . metroNIDAZOLE  500 mg Oral 3 times per day  . polyethylene glycol  17 g Oral Daily   Continuous Infusions:   Principal Problem:   Acute pancreatitis Active Problems:   Abdominal pain, acute, periumbilical   Nausea & vomiting    Time spent: 25 minutes    [facility] Pager [phone]. If 7PM-7AM, please contact night-coverage at [email or web address], password [facility] 07/11/2013, 7:05 PM  LOS: 3 days

## 2013-07-11 NOTE — Progress Notes (Signed)
Pt with elevated temp of 100.8. Pt refused to take tylenol.

## 2013-07-12 ENCOUNTER — Inpatient Hospital Stay (HOSPITAL_COMMUNITY): Payer: Medicare Other

## 2013-07-12 DIAGNOSIS — R112 Nausea with vomiting, unspecified: Secondary | ICD-10-CM | POA: Diagnosis not present

## 2013-07-12 DIAGNOSIS — R1033 Periumbilical pain: Secondary | ICD-10-CM | POA: Diagnosis not present

## 2013-07-12 DIAGNOSIS — R509 Fever, unspecified: Secondary | ICD-10-CM | POA: Diagnosis not present

## 2013-07-12 DIAGNOSIS — K859 Acute pancreatitis without necrosis or infection, unspecified: Secondary | ICD-10-CM | POA: Diagnosis not present

## 2013-07-12 LAB — CBC
HCT: 38.9 % — ABNORMAL LOW (ref 39.0–52.0)
Hemoglobin: 13.7 g/dL (ref 13.0–17.0)
MCH: 32.6 pg (ref 26.0–34.0)
MCHC: 35.2 g/dL (ref 30.0–36.0)
MCV: 92.6 fL (ref 78.0–100.0)
Platelets: 160 10*3/uL (ref 150–400)
RBC: 4.2 MIL/uL — ABNORMAL LOW (ref 4.22–5.81)
RDW: 13.5 % (ref 11.5–15.5)
WBC: 14.3 10*3/uL — ABNORMAL HIGH (ref 4.0–10.5)

## 2013-07-12 LAB — COMPREHENSIVE METABOLIC PANEL
ALT: 38 U/L (ref 0–53)
AST: 59 U/L — ABNORMAL HIGH (ref 0–37)
Albumin: 2.9 g/dL — ABNORMAL LOW (ref 3.5–5.2)
Alkaline Phosphatase: 71 U/L (ref 39–117)
BUN: 18 mg/dL (ref 6–23)
CO2: 26 mEq/L (ref 19–32)
Calcium: 8.5 mg/dL (ref 8.4–10.5)
Chloride: 102 mEq/L (ref 96–112)
Creatinine, Ser: 1.34 mg/dL (ref 0.50–1.35)
GFR calc Af Amer: 62 mL/min — ABNORMAL LOW (ref >90)
GFR calc non Af Amer: 53 mL/min — ABNORMAL LOW (ref >90)
Glucose, Bld: 92 mg/dL (ref 70–99)
Potassium: 4 mEq/L (ref 3.7–5.3)
Sodium: 141 mEq/L (ref 137–147)
Total Bilirubin: 1.6 mg/dL — ABNORMAL HIGH (ref 0.3–1.2)
Total Protein: 7 g/dL (ref 6.0–8.3)

## 2013-07-12 LAB — LIPASE, BLOOD: Lipase: 16 U/L (ref 11–59)

## 2013-07-12 MED ORDER — LEVOFLOXACIN 750 MG PO TABS
750.0000 mg | ORAL_TABLET | Freq: Every day | ORAL | Status: DC
  Administered 2013-07-12: 750 mg via ORAL
  Filled 2013-07-12: qty 1

## 2013-07-12 MED ORDER — SODIUM CHLORIDE 0.9 % IJ SOLN
3.0000 mL | Freq: Two times a day (BID) | INTRAMUSCULAR | Status: DC
  Administered 2013-07-12 – 2013-07-13 (×4): 3 mL via INTRAVENOUS

## 2013-07-12 MED ORDER — CIPROFLOXACIN HCL 500 MG PO TABS
500.0000 mg | ORAL_TABLET | Freq: Two times a day (BID) | ORAL | Status: DC
  Administered 2013-07-12 – 2013-07-13 (×2): 500 mg via ORAL
  Filled 2013-07-12 (×4): qty 1

## 2013-07-12 MED ORDER — OXYCODONE HCL 5 MG PO TABS
5.0000 mg | ORAL_TABLET | ORAL | Status: DC | PRN
  Administered 2013-07-12 – 2013-07-13 (×3): 5 mg via ORAL
  Filled 2013-07-12 (×3): qty 1

## 2013-07-12 MED ORDER — SODIUM CHLORIDE 0.9 % IJ SOLN: 3.0000 mL | INTRAMUSCULAR | Status: DC | PRN

## 2013-07-12 MED ORDER — METRONIDAZOLE 500 MG PO TABS
500.0000 mg | ORAL_TABLET | Freq: Three times a day (TID) | ORAL | Status: DC
  Administered 2013-07-12 – 2013-07-13 (×4): 500 mg via ORAL
  Filled 2013-07-12 (×6): qty 1

## 2013-07-12 NOTE — Progress Notes (Signed)
[facility] PROGRESS NOTE  Domnick Chervenak [id number]:[id number] DOB: 1945/11/26 DOA: 07/08/2013 PCP: Myriam Jacobson, MD  Assessment/Plan: Principal Problem:   Acute pancreatitis: Alcohol intake moderate, more than what the patient's wife believes. Have counseled patient on this.. Given findings of gallbladder wall thickening on ultrasound, suspect this is gallbladder related, HIDA scan notes decreased ejection fraction. Discussed this case with surgery who recommended that gallbladder be taken out, which can be done as outpatient. Triglycerides unimpressive. Lipase levels normal today so started clear liquids for last night and advanced to full. Patient having some abdominal comfort with solid food so will continue on solids.  Change pain medicine to by mouth Continue nausea and pain medication Active Problems:   Abdominal pain, acute, periumbilical: Secondary pancreatitis   Nausea & vomiting: Secondary pancreatitis  Cardiomegaly: BNP checked and was normal.  Should be monitored/followed as outpatient. Patient's blood pressure has been stable during this hospitalization.   Fever: Patient elevated temperatures, so started Cipro and Flagyl. No fever since. With some persistent discomfort and temperatures and no other source-urine chest x-ray negative, recheck abdominal CT today which notes some peripancreatic inflammation, but improving.  Fever again persisted and white blood cell count checked found to be elevated at 14. No tremor of) was coming down or going up. Repeat a chest x-ray today which was unremarkable other than mild pleural effusions and atelectasis. No signs of fever seen. Likely this is just resolving pancreatic inflammation.  Constipation: Patient had a bowel movement for several days likely from minimal by mouth intake. Abdominal CT did not note large stool burden  Code Status: Full code Family Communication: Spoke with patient's wife by phone today.  Disposition Plan:  Change  pain medicine by mouth. Plan to discharge him tomorrow morning   Consultants:  None  Procedures:  HIDA scan  decreased ejection fraction of 8%  Antibiotics:  None   HPI/Subjective: Patient feeling okay. Some mild abdominal discomfort with eating solid food, requiring pain medicine. Positive bowel movement. Ambulating halls well.  Objective: Filed Vitals:   07/12/13 1300  BP: 135/75  Pulse: 95  Temp: 101.1 F (38.4 C)  Resp: 18    Intake/Output Summary (Last 24 hours) at 07/12/13 1809 Last data filed at 07/12/13 0940  Gross per 24 hour  Intake    240 ml  Output      0 ml  Net    240 ml   Filed Weights   07/08/13 2320  Weight: 95.255 kg (210 lb)    Exam:   General:  Alert and oriented x3, no acute distress  Cardiovascular: Regular rate and rhythm, S1-S2  Respiratory: Clear auscultation bilaterally  Abdomen: Soft, minimal distention, minimal tenderness  Musculoskeletal: No clubbing or cyanosis or edema   Data Reviewed: Basic Metabolic Panel:  Recent Labs Lab 07/08/13 1742 07/09/13 0515 07/10/13 0534 07/12/13 0450  NA 145 141 143 141  K 4.1 4.0 3.6* 4.0  CL 107 110 107 102  CO2 27 19 22 26   GLUCOSE 123* 113* 75 92  BUN 12 13 16 18   CREATININE 1.25 1.11 1.23 1.34  CALCIUM 9.4 7.9* 8.2* 8.5   Liver Function Tests:  Recent Labs Lab 07/08/13 1742 07/09/13 0515 07/12/13 0450  AST 85* 64* 59*  ALT 65* 65* 38  ALKPHOS 79 62 71  BILITOT 1.4* 1.1 1.6*  PROT 7.7 6.3 7.0  ALBUMIN 3.9 3.1* 2.9*    Recent Labs Lab 07/08/13 1742 07/09/13 0515 07/10/13 0534 07/11/13 0524 07/12/13 0450  LIPASE >3000*  987* 189* 49 16   No results found for this basename: AMMONIA,  in the last 168 hours CBC:  Recent Labs Lab 07/08/13 1742 07/09/13 0715 07/12/13 0450  WBC 10.9* 10.8* 14.3*  NEUTROABS 9.7*  --   --   HGB 15.9 14.7 13.7  HCT 44.6 42.4 38.9*  MCV 92.9 93.2 92.6  PLT 171 153 160   Cardiac Enzymes: No results found for this basename:  CKTOTAL, CKMB, CKMBINDEX, TROPONINI,  in the last 168 hours BNP (last 3 results)  Recent Labs  07/10/13 0903  PROBNP 208.5*   CBG: No results found for this basename: GLUCAP,  in the last 168 hours  No results found for this or any previous visit (from the past 240 hour(s)).   Studies: Dg Chest 2 View  07/12/2013   CLINICAL DATA:  Fever and abdominal pain  EXAM: CHEST  2 VIEW  COMPARISON:  07/10/2013  FINDINGS: Heart size is normal. Small bilateral pleural effusions are a could identified. There is atelectasis in both lung bases. Compared with previous exam the appearance is unchanged. No new findings. The osseous structures appear intact.  IMPRESSION: 1. No change in bilateral pleural effusions and bibasilar atelectasis.   Electronically Signed   By: Kerby Moors M.D.   On: 07/12/2013 13:32   Ct Abdomen W Contrast  07/11/2013   CLINICAL DATA:  Fever and known pancreatitis  EXAM: CT ABDOMEN WITH CONTRAST  TECHNIQUE: Multidetector CT imaging of the abdomen was performed using the standard protocol following bolus administration of intravenous contrast.  CONTRAST:  166mL OMNIPAQUE IOHEXOL 300 MG/ML  SOLN  COMPARISON:  NM HEPATO W/GB/PHARM/QUAN MEASUR dated 07/09/2013; CT ABD/PELVIS W CM dated 07/08/2013; US ABDOMEN COMPLETE dated 07/09/2013  FINDINGS: Peripancreatic inflammatory changes are again demonstrated and appear little changed. The volume of peripancreatic fluid however has decreased. There is no pancreatic ductal dilation. The pancreatic head is edematous and heterogeneous in density, but a discrete mass is not demonstrated.  The liver exhibits no focal mass nor ductal dilation. The gallbladder is adequately distended and exhibits no evidence of stones or significant wall thickening or pericholecystic fluid. The spleen, partially distended stomach, adrenal glands, and kidneys exhibit no acute abnormality. There is a stable hypodensity in the midpole of the left kidney with HU measurement of +  11 consistent with a cyst. The caliber of the abdominal aorta is normal.  There is mild edema of the transverse portion of the duodenum but no evidence of obstruction. The jejunum and ileum exhibit a normal caliber where visualized. The stool and contrast and gas pattern within the visualized portions of the colon is normal.  Since the previous study there have developed small bilateral pleural effusions greater on the right than on the left. There is new bibasilar atelectasis. The cardiac chambers are top-normal in size. The observed portions of the lumbar spine appear normal.  IMPRESSION: 1. The peripancreatic inflammatory changes persist but the volume of peripancreatic fluid has decreased. 2. New small bilateral pleural effusions with bibasilar atelectasis have developed. 3. No acute abnormality of the liver or gallbladder is demonstrated. 4. No acute abnormality of the visualized portions of the bowel is demonstrated.   Electronically Signed   By: Anis  Martinique   On: 07/11/2013 14:22    Scheduled Meds: . ciprofloxacin  500 mg Oral BID  . enoxaparin (LOVENOX) injection  40 mg Subcutaneous Q24H  . metroNIDAZOLE  500 mg Oral 3 times per day  . polyethylene glycol  17 g Oral  Daily  . sodium chloride  3 mL Intravenous Q12H   Continuous Infusions:   Principal Problem:   Acute pancreatitis Active Problems:   Abdominal pain, acute, periumbilical   Nausea & vomiting    Time spent: 25 minutes    [facility] Pager [phone]. If 7PM-7AM, please contact night-coverage at [email or web address], password [facility] 07/12/2013, 6:09 PM  LOS: 4 days

## 2013-07-13 DIAGNOSIS — R1033 Periumbilical pain: Secondary | ICD-10-CM | POA: Diagnosis not present

## 2013-07-13 DIAGNOSIS — K859 Acute pancreatitis without necrosis or infection, unspecified: Secondary | ICD-10-CM | POA: Diagnosis not present

## 2013-07-13 DIAGNOSIS — R112 Nausea with vomiting, unspecified: Secondary | ICD-10-CM | POA: Diagnosis not present

## 2013-07-13 DIAGNOSIS — R509 Fever, unspecified: Secondary | ICD-10-CM | POA: Diagnosis not present

## 2013-07-13 LAB — CBC
HCT: 37.8 % — ABNORMAL LOW (ref 39.0–52.0)
Hemoglobin: 13.4 g/dL (ref 13.0–17.0)
MCH: 32.8 pg (ref 26.0–34.0)
MCHC: 35.4 g/dL (ref 30.0–36.0)
MCV: 92.4 fL (ref 78.0–100.0)
Platelets: 187 10*3/uL (ref 150–400)
RBC: 4.09 MIL/uL — ABNORMAL LOW (ref 4.22–5.81)
RDW: 13.6 % (ref 11.5–15.5)
WBC: 15.1 10*3/uL — ABNORMAL HIGH (ref 4.0–10.5)

## 2013-07-13 NOTE — Progress Notes (Signed)
[facility] PROGRESS NOTE  Mason [name] HOZ:[id number] DOB: 10-07-1945 DOA: 07/08/2013 PCP: Myriam Jacobson, MD  Assessment/Plan: Principal Problem:   Acute pancreatitis: Alcohol intake moderate, more than what the patient's wife believes. Have counseled patient on this.. Given findings of gallbladder wall thickening on ultrasound, suspect this is gallbladder related, HIDA scan notes decreased ejection fraction. Discussed this case with surgery who recommended that gallbladder be taken out, which can be done as outpatient. Triglycerides unimpressive. Lipase levels normal today so started clear liquids for last night and advanced to full. Patient having some abdominal comfort with solid food so will continue on solids.  Change pain medicine to by mouth Continue nausea and pain medication.  Patient is able take by mouth at this point with only mild discomfort Active Problems:   Abdominal pain, acute, periumbilical: Secondary pancreatitis   Nausea & vomiting: Secondary pancreatitis  Cardiomegaly: BNP checked and was normal.  Should be monitored/followed as outpatient. Patient's blood pressure has been stable during this hospitalization.   Fever: Patient elevated temperatures, so started Cipro and Flagyl. No fever since. With some persistent discomfort and temperatures and no other source-urine chest x-ray negative, recheck abdominal CT today which notes some peripancreatic inflammation, but improving.  Fever again persisted and white blood cell count checked found to be elevated at 14. No tremor of) was coming down or going up. Repeat a chest x-ray today which was unremarkable other than mild pleural effusions and atelectasis. No signs of fever seen. Likely this is just resolving pancreatic inflammation. Had temperature of 101 last night and white count up further this morning. Discussed with several hospitalists as well as gastroenterology. Recommendation is that likely this is pancreatitis.  Other sources of been negative. We'll continue to monitor one more night. Check locally Dopplers to rule out DVT and if this is all negative, likely patient can go home. We'll discontinue all antibiotics at this point.  Constipation: Patient had a bowel movement for several days likely from minimal by mouth intake. Abdominal CT did not note large stool burden  Code Status: Full code Family Communication: Spoke with patient's wife by phone today.  Disposition Plan:  Change pain medicine by mouth. Plan to discharge him hopefully tomorrow   Consultants:  None  Procedures:  HIDA scan  decreased ejection fraction of 8%  Antibiotics:  Cipro and Flagyl 2/19-2/22  HPI/Subjective: Patient feeling okay. Some mild abdominal discomfort with eating solid food, requiring pain medicine. She is slightly bloated when eating, but otherwise no pain  Objective: Filed Vitals:   07/13/13 1354  BP: 130/74  Pulse: 99  Temp: 99.1 F (37.3 C)  Resp: 18    Intake/Output Summary (Last 24 hours) at 07/13/13 1554 Last data filed at 07/13/13 1355  Gross per 24 hour  Intake    118 ml  Output      0 ml  Net    118 ml   Filed Weights   07/08/13 2320  Weight: 95.255 kg (210 lb)    Exam:   General:  Alert and oriented x3, no acute distress  Cardiovascular: Regular rate and rhythm, S1-S2  Respiratory: Clear auscultation bilaterally  Abdomen: Soft, minimal distention, minimal tenderness  Musculoskeletal: No clubbing or cyanosis or edema   Data Reviewed: Basic Metabolic Panel:  Recent Labs Lab 07/08/13 1742 07/09/13 0515 07/10/13 0534 07/12/13 0450  NA 145 141 143 141  K 4.1 4.0 3.6* 4.0  CL 107 110 107 102  CO2 27 19 22 26   GLUCOSE 123*  113* 75 92  BUN 12 13 16 18   CREATININE 1.25 1.11 1.23 1.34  CALCIUM 9.4 7.9* 8.2* 8.5   Liver Function Tests:  Recent Labs Lab 07/08/13 1742 07/09/13 0515 07/12/13 0450  AST 85* 64* 59*  ALT 65* 65* 38  ALKPHOS 79 62 71  BILITOT 1.4*  1.1 1.6*  PROT 7.7 6.3 7.0  ALBUMIN 3.9 3.1* 2.9*    Recent Labs Lab 07/08/13 1742 07/09/13 0515 07/10/13 0534 07/11/13 0524 07/12/13 0450  LIPASE >3000* 987* 189* 49 16   No results found for this basename: AMMONIA,  in the last 168 hours CBC:  Recent Labs Lab 07/08/13 1742 07/09/13 0715 07/12/13 0450 07/13/13 0850  WBC 10.9* 10.8* 14.3* 15.1*  NEUTROABS 9.7*  --   --   --   HGB 15.9 14.7 13.7 13.4  HCT 44.6 42.4 38.9* 37.8*  MCV 92.9 93.2 92.6 92.4  PLT 171 153 160 187   Cardiac Enzymes: No results found for this basename: CKTOTAL, CKMB, CKMBINDEX, TROPONINI,  in the last 168 hours BNP (last 3 results)  Recent Labs  07/10/13 0903  PROBNP 208.5*   CBG: No results found for this basename: GLUCAP,  in the last 168 hours  No results found for this or any previous visit (from the past 240 hour(s)).   Studies: Dg Chest 2 View  07/12/2013   CLINICAL DATA:  Fever and abdominal pain  EXAM: CHEST  2 VIEW  COMPARISON:  07/10/2013  FINDINGS: Heart size is normal. Small bilateral pleural effusions are a could identified. There is atelectasis in both lung bases. Compared with previous exam the appearance is unchanged. No new findings. The osseous structures appear intact.  IMPRESSION: 1. No change in bilateral pleural effusions and bibasilar atelectasis.   Electronically Signed   By: Kerby Moors M.D.   On: 07/12/2013 13:32    Scheduled Meds: . enoxaparin (LOVENOX) injection  40 mg Subcutaneous Q24H  . polyethylene glycol  17 g Oral Daily  . sodium chloride  3 mL Intravenous Q12H   Continuous Infusions:   Principal Problem:   Acute pancreatitis Active Problems:   Abdominal pain, acute, periumbilical   Nausea & vomiting    Time spent: 25 minutes    [facility] Pager [phone]. If 7PM-7AM, please contact night-coverage at [email or web address], password [facility] 07/13/2013, 3:54 PM  LOS: 5 days

## 2013-07-14 DIAGNOSIS — I517 Cardiomegaly: Secondary | ICD-10-CM | POA: Diagnosis not present

## 2013-07-14 DIAGNOSIS — K859 Acute pancreatitis without necrosis or infection, unspecified: Secondary | ICD-10-CM | POA: Diagnosis not present

## 2013-07-14 DIAGNOSIS — R509 Fever, unspecified: Secondary | ICD-10-CM | POA: Diagnosis not present

## 2013-07-14 DIAGNOSIS — K5900 Constipation, unspecified: Secondary | ICD-10-CM | POA: Diagnosis not present

## 2013-07-14 LAB — CBC WITH DIFFERENTIAL/PLATELET
Basophils Absolute: 0 10*3/uL (ref 0.0–0.1)
Basophils Relative: 0 % (ref 0–1)
Eosinophils Absolute: 0 10*3/uL (ref 0.0–0.7)
Eosinophils Relative: 0 % (ref 0–5)
HCT: 33 % — ABNORMAL LOW (ref 39.0–52.0)
Hemoglobin: 11.5 g/dL — ABNORMAL LOW (ref 13.0–17.0)
Lymphocytes Relative: 10 % — ABNORMAL LOW (ref 12–46)
Lymphs Abs: 1.1 10*3/uL (ref 0.7–4.0)
MCH: 31.9 pg (ref 26.0–34.0)
MCHC: 34.8 g/dL (ref 30.0–36.0)
MCV: 91.4 fL (ref 78.0–100.0)
Monocytes Absolute: 0.9 10*3/uL (ref 0.1–1.0)
Monocytes Relative: 8 % (ref 3–12)
Neutro Abs: 9.8 10*3/uL — ABNORMAL HIGH (ref 1.7–7.7)
Neutrophils Relative %: 83 % — ABNORMAL HIGH (ref 43–77)
Platelets: 185 10*3/uL (ref 150–400)
RBC: 3.61 MIL/uL — ABNORMAL LOW (ref 4.22–5.81)
RDW: 13.6 % (ref 11.5–15.5)
WBC: 11.9 10*3/uL — ABNORMAL HIGH (ref 4.0–10.5)

## 2013-07-14 LAB — COMPREHENSIVE METABOLIC PANEL
ALT: 39 U/L (ref 0–53)
AST: 69 U/L — ABNORMAL HIGH (ref 0–37)
Albumin: 2.3 g/dL — ABNORMAL LOW (ref 3.5–5.2)
Alkaline Phosphatase: 60 U/L (ref 39–117)
BUN: 18 mg/dL (ref 6–23)
CO2: 18 mEq/L — ABNORMAL LOW (ref 19–32)
Calcium: 8.2 mg/dL — ABNORMAL LOW (ref 8.4–10.5)
Chloride: 103 mEq/L (ref 96–112)
Creatinine, Ser: 1.17 mg/dL (ref 0.50–1.35)
GFR calc Af Amer: 73 mL/min — ABNORMAL LOW (ref >90)
GFR calc non Af Amer: 63 mL/min — ABNORMAL LOW (ref >90)
Glucose, Bld: 104 mg/dL — ABNORMAL HIGH (ref 70–99)
Potassium: 3.8 mEq/L (ref 3.7–5.3)
Sodium: 136 mEq/L — ABNORMAL LOW (ref 137–147)
Total Bilirubin: 1.1 mg/dL (ref 0.3–1.2)
Total Protein: 6.2 g/dL (ref 6.0–8.3)

## 2013-07-14 LAB — LIPASE, BLOOD: Lipase: 20 U/L (ref 11–59)

## 2013-07-14 MED ORDER — OXYCODONE HCL 5 MG PO CAPS: 5.0000 mg | ORAL_CAPSULE | ORAL | Status: DC | PRN

## 2013-07-14 NOTE — Discharge Instructions (Signed)
Acute Pancreatitis °Acute pancreatitis is a disease in which the pancreas becomes suddenly inflamed. The pancreas is a large gland located behind your stomach. The pancreas produces enzymes that help digest food. The pancreas also releases the hormones glucagon and insulin that help regulate blood sugar. Damage to the pancreas occurs when the digestive enzymes from the pancreas are activated and begin attacking the pancreas before being released into the intestine. Most acute attacks last a couple of days and can cause serious complications. Some people become dehydrated and develop low blood pressure. In severe cases, bleeding into the pancreas can lead to shock and can be life-threatening. The lungs, heart, and kidneys may fail. °CAUSES  °Pancreatitis can happen to anyone. In some cases, the cause is unknown. Most cases are caused by: °· Alcohol abuse. °· Gallstones. °Other less common causes are: °· Certain medicines. °· Exposure to certain chemicals. °· Infection. °· Damage caused by an accident (trauma). °· Abdominal surgery. °SYMPTOMS  °· Pain in the upper abdomen that may radiate to the back. °· Tenderness and swelling of the abdomen. °· Nausea and vomiting. °DIAGNOSIS  °Your caregiver will perform a physical exam. Blood and stool tests may be done to confirm the diagnosis. Imaging tests may also be done, such as X-rays, CT scans, or an ultrasound of the abdomen. °TREATMENT  °Treatment usually requires a stay in the hospital. Treatment may include: °· Pain medicine. °· Fluid replacement through an intravenous line (IV). °· Placing a tube in the stomach to remove stomach contents and control vomiting. °· Not eating for 3 or 4 days. This gives your pancreas a rest, because enzymes are not being produced that can cause further damage. °· Antibiotic medicines if your condition is caused by an infection. °· Surgery of the pancreas or gallbladder. °HOME CARE INSTRUCTIONS  °· Follow the diet advised by your  caregiver. This may involve avoiding alcohol and decreasing the amount of fat in your diet. °· Eat smaller, more frequent meals. This reduces the amount of digestive juices the pancreas produces. °· Drink enough fluids to keep your urine clear or pale yellow. °· Only take over-the-counter or prescription medicines as directed by your caregiver. °· Avoid drinking alcohol if it caused your condition. °· Do not smoke. °· Get plenty of rest. °· Check your blood sugar at home as directed by your caregiver. °· Keep all follow-up appointments as directed by your caregiver. °SEEK MEDICAL CARE IF:  °· You do not recover as quickly as expected. °· You develop new or worsening symptoms. °· You have persistent pain, weakness, or nausea. °· You recover and then have another episode of pain. °SEEK IMMEDIATE MEDICAL CARE IF:  °· You are unable to eat or keep fluids down. °· Your pain becomes severe. °· You have a fever or persistent symptoms for more than 2 to 3 days. °· You have a fever and your symptoms suddenly get worse. °· Your skin or the white part of your eyes turn yellow (jaundice). °· You develop vomiting. °· You feel dizzy, or you faint. °· Your blood sugar is high (over 300 mg/dL). °MAKE SURE YOU:  °· Understand these instructions. °· Will watch your condition. °· Will get help right away if you are not doing well or get worse. °Document Released: 05/08/2005 Document Revised: 11/07/2011 Document Reviewed: 08/17/2011 °ExitCare® Patient Information ©2014 [employer]. ° °

## 2013-07-14 NOTE — Care Management Note (Signed)
    Page 1 of 1   07/14/2013     6:17:36 PM   CARE MANAGEMENT NOTE 07/14/2013  Patient:  Mason [name], Mason [name]   Account Number:  [id number]  Date Initiated:  07/14/2013  Documentation initiated by:  Tomi Bamberger  Subjective/Objective Assessment:   dx pancreatitis  admit     Action/Plan:   Anticipated DC Date:  07/14/2013   Anticipated DC Plan:  [place]  CM consult      Choice offered to / List presented to:             Status of service:  Completed, signed off Medicare Important Message given?   (If response is "NO", the following Medicare IM given date fields will be blank) Date Medicare IM given:   Date Additional Medicare IM given:    Discharge Disposition:  HOME/SELF CARE  Per UR Regulation:  Reviewed for med. necessity/level of care/duration of stay  If discussed at [place] of Stay Meetings, dates discussed:    Comments:

## 2013-07-14 NOTE — Discharge Summary (Signed)
Physician Discharge Summary  Mason [name] [id number] DOB: 1946/02/23 Mason [name]  PCP: Mason [name], Mason [name]  Admit date: [name] Discharge date: 07/14/2013  Time spent: 25 minutes  Recommendations for Outpatient Follow-up:  1. Advising patient to followup with [facility] for evaluation of gallbladder removal 2. New medication OxyIR 5 mg every 4 hours when necessary  Discharge Diagnoses:  Principal Problem:   Acute pancreatitis Active Problems:   Abdominal pain, acute, periumbilical   Nausea & vomiting   Abnormal ultrasound of abdomen   Cardiomegaly   Fever, unspecified   Unspecified constipation   Discharge Condition: Improved, being discharged home  Diet recommendation: Regular  Filed Weights   07/08/13 2320  Weight: 95.255 kg (210 lb)    History of present illness:  68 year old African American male with essentially no past medical history other than 7 months ago he had a gallstone presented to the emergency room with one-day history of periumbilical pain moving to the right lower quadrant pain and persisting with associated nausea and vomiting.  Patient in the emergency room and by CT scan was noted to have signs consistent with pancreatitis noting an edematous and inflammatory pancreas but no evidence of obstruction. In addition his lipase level was markedly elevated at greater than 3000. Patient's transaminases were normal and he was admitted to the hospitalist service.  Hospital Course:  Principal Problem:   Acute pancreatitis: Patient's alcohol intake was evaluated and found to be minimal. The right upper quadrant ultrasound noted signs of gallbladder wall thickening. No evidence of stone was noted. A HIDA scan was done noting poor ejection fraction of 8%. I discussed this with the patient and his wife. She has serious reservations about any kind of surgery for the patient, but hold her that an indication for gallstone pancreatitis was in the  cholecystectomy. At the very least with compromise and the patient assures me that he will go for his followup appointment for surgical evaluation in the office and then think about surgery. Over the next few days, patient's lipase level dramatically improved and by hospital day 3 was down to 189. At this point we started him on clear liquids that evening and by the following day, his lipase level was normal. Patient was tolerating food, and still had some mild abdominal discomfort. His course was complicated by fever and leukocytosis. See below. Active Problems:   Abdominal pain, acute, periumbilical: Felt to be secondary to pancreatitis. For the most part has resolved, with minimal discomfort when eating. He should resolve over the next 2 days. Patient will go home with pain medication as needed.    Nausea & vomiting: Secondary pancreatitis. Resolved after several days.    Abnormal ultrasound of abdomen: As mentioned above, gallbladder wall thickening noted    Cardiomegaly: Incidentally noted on chest x-ray. BNP normal. No further workup.    Fever, unspecified: On hospital day 3, patient started spiking temperatures as high as 101.8. Initially this was felt to be secondary pancreatitis and he was started on Cipro and Flagyl. A chest x-ray was done which was unremarkable. Urinalysis was done which was unremarkable as well. Over the next few days, despite pancreatitis clinically improving, fevers persisted despite Cipro and Flagyl. Repeat CT scan was then done on hospital day 4 which noted some pancreatic inflammation, but decreased pancreatic edema. All signs of improvement. No signs of pancreatic necrosis. White blood cell count was hospital day 5 and was elevated at 14.3. However without a reference point whether this was going  up or down, we elected to watch patient. Repeat chest x-ray was done on hospital day 4 which was again noted minimal pleural effusions and no pneumonia. Hospital day 5, patient  again had fever overnight and his white blood cell count actually gone up slightly. I discussed this case with several hospitalist as well as gastroenterology who felt that care was continued to be appropriate. There is no signs of pancreatic process. No signs of infection. He felt a fever secondary pancreatitis. For complete thoroughness, the patient was on anticoagulation, largely Dopplers were ordered which were negative for DVT. By hospital day 6, patient what blood cell count had decreased down to 11.5. At this point he had been off of antibiotics as per conditions per GI since hospital day 5. He has not had fever to 36 hours and is felt medically stable for discharge. Fevers felt to be secondary to resolving pancreatitis only. Patient will not be on any further antibiotics.   Unspecified constipation: Patient stated that he is not having much of a bowel movement even after resuming by mouth. This is likely because of narcotic medication received for pancreatitis. With over-the-counter laxative, patient had some moderate bowel movements starting on day 4 4.  Procedures:  Bilateral lower extremity Dopplers done 2/23: No evidence of DVT or Baker's cyst  Consultations:  None  Discharge Exam: Filed Vitals:   07/14/13 0624  BP: 133/76  Pulse: 85  Temp: 98.9 F (37.2 C)  Resp: 18    General: In no acute distress, alert and oriented x3 Cardiovascular: Regular rate and rhythm, S1-S2 Respiratory: Clear to auscultation bilaterally Abdomen: Soft, nontender, nondistended, positive bowel sounds Extremities: No clubbing or cyanosis or edema  Discharge Instructions  Discharge Orders   Future Orders Complete By Expires   Diet - low sodium heart healthy  As directed    Increase activity slowly  As directed        Medication List         albuterol 108 (90 BASE) MCG/ACT inhaler  Commonly known as:  PROVENTIL HFA;VENTOLIN HFA  Inhale 1-2 puffs into the lungs every 6 (six) hours as needed  for wheezing or shortness of breath.     aspirin-sod bicarb-citric acid 325 MG Tbef tablet  Commonly known as:  ALKA-SELTZER  Take 325 mg by mouth daily as needed (for indigestion).     bismuth subsalicylate 778 EU/23NT suspension  Commonly known as:  PEPTO BISMOL  Take 30 mLs by mouth every 6 (six) hours as needed for indigestion.     loratadine 10 MG tablet  Commonly known as:  CLARITIN  Take 10 mg by mouth daily as needed for allergies.     oxycodone 5 MG capsule  Commonly known as:  OXY-IR  Take 1 capsule (5 mg total) by mouth every 4 (four) hours as needed.       No Known Allergies     Follow-up Information   Follow up with ROBERTS, Mason Given, Mason [name] In 1 month.   Specialty:  Internal Medicine   Contact information:   [place], [place] [address] [phone]       Follow up with [facility]   Specialty:  General Surgery   Contact information:   [address] [place] [address] [phone]       The results of significant diagnostics from this hospitalization (including imaging, microbiology, ancillary and laboratory) are listed below for reference.    Significant Diagnostic Studies: Dg  Chest 2 View  07/12/2013   .  IMPRESSION: 1. No change in bilateral pleural effusions and bibasilar atelectasis.   Electronically Signed   By: Kerby Moors M.D.   On: 07/12/2013 13:32   Dg Chest 2 View  07/10/2013  IMPRESSION: 1. Low lung volumes with bibasilar atelectasis. 2. Possible small pleural effusions.   Electronically Signed   By: Lars Pinks M.D.   On: 07/10/2013 18:34   Ct Abdomen W Contrast  07/11/2013   .  IMPRESSION: 1. The peripancreatic inflammatory changes persist but the volume of peripancreatic fluid has decreased. 2. New small bilateral pleural effusions with bibasilar atelectasis have developed. 3. No acute abnormality of the liver or gallbladder is demonstrated. 4. No acute abnormality of the visualized  portions of the bowel is demonstrated.   Electronically Signed   By: Mihailo  [place]   On: 07/11/2013 14:22   US Abdomen Complete  07/09/2013    IMPRESSION: Visualized pancreas is grossly unremarkable by ultrasound in this patient with acute pancreatitis.  Suspected secondary gallbladder wall thickening/edema. No evidence of cholelithiasis or acute cholecystitis.  Common duct measures 5 mm, within normal limits.   Electronically Signed   By: Julian Hy M.D.   On: 07/09/2013 08:11   Ct Abdomen Pelvis W Contrast  [name]     IMPRESSION: 1. Pancreatitis. No evidence of pseudocyst or hemorrhage. No evidence of biliary obstruction. 2. Bibasilar atelectasis and/or infiltrates. 3. Borderline cardiomegaly.  Coronary artery disease.   Electronically Signed   By: Marcello Moores  Register   On: 07/08/2013 19:11   Nm Hepato W/eject Fract  07/09/2013   CLINICAL DATA:  Abdominal pain, nausea and vomiting. Severe pancreatitis.  EXAM: NUCLEAR MEDICINE HEPATOBILIARY IMAGING WITH GALLBLADDER EF  TECHNIQUE: Sequential images of the abdomen were obtained out to 60 minutes following intravenous administration of radiopharmaceutical. After slow intravenous infusion of 1.91 micrograms Cholecystokinin, gallbladder ejection fraction was determined.  COMPARISON:  Ultrasound 07/09/2013  RADIOPHARMACEUTICALS:  5.0 mCi technetium Choletec  FINDINGS: There is symmetric uptake in the liver and prompt excretion to the biliary tree which is visualized by 10 min. The gallbladder is visualized at 15 min. Activity is noted in the small bowel at 35 min.  Gallbladder ejection fraction was calculated at 8.5%. Normal is greater than 30%.  IMPRESSION: Normal biliary patency study.  Low gallbladder ejection fraction at 8.5%.   Electronically Signed   By: Kalman Jewels M.D.   On: 07/09/2013 14:44    Microbiology: No results found for this or any previous visit (from the past 240 hour(s)).   Labs: Basic Metabolic Panel:  Recent Labs Lab  07/08/13 1742 07/09/13 0515 07/10/13 0534 07/12/13 0450 07/14/13 0721  NA 145 141 143 141 136*  K 4.1 4.0 3.6* 4.0 3.8  CL 107 110 107 102 103  CO2 27 19 22 26  18*  GLUCOSE 123* 113* 75 92 104*  BUN 12 13 16 18 18   CREATININE 1.25 1.11 1.23 1.34 1.17  CALCIUM 9.4 7.9* 8.2* 8.5 8.2*   Liver Function Tests:  Recent Labs Lab 07/08/13 1742 07/09/13 0515 07/12/13 0450 07/14/13 0721  AST 85* 64* 59* 69*  ALT 65* 65* 38 39  ALKPHOS 79 62 71 60  BILITOT 1.4* 1.1 1.6* 1.1  PROT 7.7 6.3 7.0 6.2  ALBUMIN 3.9 3.1* 2.9* 2.3*    Recent Labs Lab 07/09/13 0515 07/10/13 0534 07/11/13 0524 07/12/13 0450 07/14/13 0721  LIPASE 987* 189* 49 16 20   No results found for this basename:  AMMONIA,  in the last 168 hours CBC:  Recent Labs Lab 07/08/13 1742 07/09/13 0715 07/12/13 0450 07/13/13 0850 07/14/13 0721  WBC 10.9* 10.8* 14.3* 15.1* 11.9*  NEUTROABS 9.7*  --   --   --  9.8*  HGB 15.9 14.7 13.7 13.4 11.5*  HCT 44.6 42.4 38.9* 37.8* 33.0*  MCV 92.9 93.2 92.6 92.4 91.4  PLT 171 153 160 187 185   Cardiac Enzymes: No results found for this basename: CKTOTAL, CKMB, CKMBINDEX, TROPONINI,  in the last 168 hours BNP: BNP (last 3 results)  Recent Labs  07/10/13 0903  PROBNP 208.5*   CBG: No results found for this basename: GLUCAP,  in the last 168 hours     Signed:  Annita Brod  [facility] 07/14/2013, 10:05 AM

## 2013-07-14 NOTE — Progress Notes (Signed)
NURSING PROGRESS NOTE  Mason [name] [id number] Discharge Data: 07/14/2013 12:50 PM Attending Provider: Annita Brod, MD [id number], Sharol Given, MD     Mason [name] to be D/C'd Home  per MD order.    All IV's discontinued with no bleeding noted.  All belongings returned to patient for patient to take home.   Last Vital Signs:  Blood pressure 133/76, pulse 85, temperature 98.9 F (37.2 C), temperature source Oral, resp. rate 18, height 6' (1.829 m), weight 95.255 kg (210 lb), SpO2 93.00%.  Discharge Medication List   Medication List         albuterol 108 (90 BASE) MCG/ACT inhaler  Commonly known as:  PROVENTIL HFA;VENTOLIN HFA  Inhale 1-2 puffs into the lungs every 6 (six) hours as needed for wheezing or shortness of breath.     aspirin-sod bicarb-citric acid 325 MG Tbef tablet  Commonly known as:  ALKA-SELTZER  Take 325 mg by mouth daily as needed (for indigestion).     bismuth subsalicylate 476 LY/65KP suspension  Commonly known as:  PEPTO BISMOL  Take 30 mLs by mouth every 6 (six) hours as needed for indigestion.     loratadine 10 MG tablet  Commonly known as:  CLARITIN  Take 10 mg by mouth daily as needed for allergies.     oxycodone 5 MG capsule  Commonly known as:  OXY-IR  Take 1 capsule (5 mg total) by mouth every 4 (four) hours as needed.        Joslyn Hy, MSN, RN, [employer]

## 2013-07-14 NOTE — Progress Notes (Signed)
*  PRELIMINARY RESULTS* Vascular Ultrasound Lower extremity venous duplex has been completed.  Preliminary findings: no evidence of DVT or baker's cyst.    Landry Mellow, RDMS, RVT  07/14/2013, 7:57 AM

## 2013-07-16 ENCOUNTER — Emergency Department (HOSPITAL_COMMUNITY): Payer: Medicare Other

## 2013-07-16 ENCOUNTER — Emergency Department (HOSPITAL_COMMUNITY)
Admission: EM | Admit: 2013-07-16 | Discharge: 2013-07-17 | Disposition: A | Discharge status: Home / Self Care | Payer: Medicare Other | Attending: Emergency Medicine | Admitting: Emergency Medicine

## 2013-07-16 ENCOUNTER — Encounter (HOSPITAL_COMMUNITY): Payer: Self-pay | Admitting: Emergency Medicine

## 2013-07-16 DIAGNOSIS — R0602 Shortness of breath: Secondary | ICD-10-CM | POA: Diagnosis not present

## 2013-07-16 DIAGNOSIS — Z862 Personal history of diseases of the blood and blood-forming organs and certain disorders involving the immune mechanism: Secondary | ICD-10-CM | POA: Insufficient documentation

## 2013-07-16 DIAGNOSIS — Z8639 Personal history of other endocrine, nutritional and metabolic disease: Secondary | ICD-10-CM | POA: Diagnosis not present

## 2013-07-16 DIAGNOSIS — J45909 Unspecified asthma, uncomplicated: Secondary | ICD-10-CM | POA: Diagnosis not present

## 2013-07-16 DIAGNOSIS — R509 Fever, unspecified: Secondary | ICD-10-CM | POA: Diagnosis present

## 2013-07-16 DIAGNOSIS — Z79899 Other long term (current) drug therapy: Secondary | ICD-10-CM | POA: Diagnosis not present

## 2013-07-16 DIAGNOSIS — K859 Acute pancreatitis without necrosis or infection, unspecified: Secondary | ICD-10-CM | POA: Diagnosis not present

## 2013-07-16 DIAGNOSIS — J45998 Other asthma: Secondary | ICD-10-CM | POA: Diagnosis present

## 2013-07-16 DIAGNOSIS — J45901 Unspecified asthma with (acute) exacerbation: Secondary | ICD-10-CM | POA: Diagnosis not present

## 2013-07-16 DIAGNOSIS — J9819 Other pulmonary collapse: Secondary | ICD-10-CM | POA: Diagnosis not present

## 2013-07-16 DIAGNOSIS — J90 Pleural effusion, not elsewhere classified: Secondary | ICD-10-CM | POA: Diagnosis not present

## 2013-07-16 LAB — CBC WITH DIFFERENTIAL/PLATELET
Basophils Absolute: 0 10*3/uL (ref 0.0–0.1)
Basophils Relative: 0 % (ref 0–1)
Eosinophils Absolute: 0 10*3/uL (ref 0.0–0.7)
Eosinophils Relative: 0 % (ref 0–5)
HCT: 38 % — ABNORMAL LOW (ref 39.0–52.0)
Hemoglobin: 13.2 g/dL (ref 13.0–17.0)
Lymphocytes Relative: 16 % (ref 12–46)
Lymphs Abs: 1.6 10*3/uL (ref 0.7–4.0)
MCH: 32.4 pg (ref 26.0–34.0)
MCHC: 34.7 g/dL (ref 30.0–36.0)
MCV: 93.1 fL (ref 78.0–100.0)
Monocytes Absolute: 0.5 10*3/uL (ref 0.1–1.0)
Monocytes Relative: 5 % (ref 3–12)
Neutro Abs: 7.8 10*3/uL — ABNORMAL HIGH (ref 1.7–7.7)
Neutrophils Relative %: 79 % — ABNORMAL HIGH (ref 43–77)
Platelets: 316 10*3/uL (ref 150–400)
RBC: 4.08 MIL/uL — ABNORMAL LOW (ref 4.22–5.81)
RDW: 13.4 % (ref 11.5–15.5)
WBC: 9.9 10*3/uL (ref 4.0–10.5)

## 2013-07-16 LAB — COMPREHENSIVE METABOLIC PANEL
ALT: 64 U/L — ABNORMAL HIGH (ref 0–53)
AST: 71 U/L — ABNORMAL HIGH (ref 0–37)
Albumin: 2.9 g/dL — ABNORMAL LOW (ref 3.5–5.2)
Alkaline Phosphatase: 73 U/L (ref 39–117)
BUN: 12 mg/dL (ref 6–23)
CO2: 28 mEq/L (ref 19–32)
Calcium: 9 mg/dL (ref 8.4–10.5)
Chloride: 99 mEq/L (ref 96–112)
Creatinine, Ser: 1.19 mg/dL (ref 0.50–1.35)
GFR calc Af Amer: 71 mL/min — ABNORMAL LOW (ref >90)
GFR calc non Af Amer: 61 mL/min — ABNORMAL LOW (ref >90)
Glucose, Bld: 122 mg/dL — ABNORMAL HIGH (ref 70–99)
Potassium: 4.1 mEq/L (ref 3.7–5.3)
Sodium: 138 mEq/L (ref 137–147)
Total Bilirubin: 0.6 mg/dL (ref 0.3–1.2)
Total Protein: 7.5 g/dL (ref 6.0–8.3)

## 2013-07-16 LAB — URINALYSIS, ROUTINE W REFLEX MICROSCOPIC
Bilirubin Urine: NEGATIVE
Glucose, UA: NEGATIVE mg/dL
Hgb urine dipstick: NEGATIVE
Ketones, ur: NEGATIVE mg/dL
Leukocytes, UA: NEGATIVE
Nitrite: NEGATIVE
Protein, ur: NEGATIVE mg/dL
Specific Gravity, Urine: 1.019 (ref 1.005–1.030)
Urobilinogen, UA: 0.2 mg/dL (ref 0.0–1.0)
pH: 5.5 (ref 5.0–8.0)

## 2013-07-16 LAB — LIPASE, BLOOD: Lipase: 39 U/L (ref 11–59)

## 2013-07-16 MED ORDER — SODIUM CHLORIDE 0.9 % IV BOLUS (SEPSIS)
1000.0000 mL | INTRAVENOUS | Status: AC
  Administered 2013-07-16: 1000 mL via INTRAVENOUS

## 2013-07-16 MED ORDER — HYDROMORPHONE HCL PF 1 MG/ML IJ SOLN
1.0000 mg | Freq: Once | INTRAMUSCULAR | Status: AC
  Administered 2013-07-16: 1 mg via INTRAVENOUS
  Filled 2013-07-16: qty 1

## 2013-07-16 MED ORDER — ALBUTEROL SULFATE (2.5 MG/3ML) 0.083% IN NEBU
5.0000 mg | INHALATION_SOLUTION | Freq: Once | RESPIRATORY_TRACT | Status: AC
  Administered 2013-07-16: 5 mg via RESPIRATORY_TRACT
  Filled 2013-07-16: qty 6

## 2013-07-16 NOTE — ED Notes (Signed)
Bed: WA07 Expected date:  Expected time:  Means of arrival:  Comments: Mare Ferrari

## 2013-07-16 NOTE — ED Provider Notes (Signed)
CSN: [id number]     Arrival date & time 07/16/13  1637 History   First MD Initiated Contact with Patient 07/16/13 2118     Chief Complaint  Patient presents with  . Abdominal Pain     (Consider location/radiation/quality/duration/timing/severity/associated sxs/prior Treatment) Patient is a 68 y.o. male presenting with abdominal pain. The history is provided by the patient and the spouse.  Abdominal Pain Pain location:  Epigastric Pain quality: aching   Pain radiates to:  Does not radiate Pain severity:  Mild Onset quality:  Gradual Timing:  Intermittent Progression:  Waxing and waning Chronicity:  New Context comment:  Recent dx of pancreatitis Relieved by: oral pain medicine. Worsened by:  Eating Ineffective treatments:  None tried Associated symptoms: fever and shortness of breath   Associated symptoms: no chest pain, no cough, no diarrhea, no dysuria, no hematuria, no nausea and no vomiting     Past Medical History  Diagnosis Date  . Seasonal asthma   . Pancreatitis, acute 07/08/2013  . Gout attack 1990   Past Surgical History  Procedure Laterality Date  . Circumcision  ~ 1974  . Eye examination under anesthesia w/ retinal cryotherapy and retinal laser Right 2014    "for tear"   History reviewed. No pertinent family history. History  Substance Use Topics  . Smoking status: Never Smoker   . Smokeless tobacco: Never Used  . Alcohol Use: 1.8 oz/week    2 Cans of beer, 1 Shots of liquor per week     Comment: 07/08/2013    Review of Systems  Constitutional: Positive for fever.  HENT: Negative for drooling and rhinorrhea.   Eyes: Negative for pain.  Respiratory: Positive for shortness of breath. Negative for cough.   Cardiovascular: Negative for chest pain and leg swelling.  Gastrointestinal: Positive for abdominal pain. Negative for nausea, vomiting and diarrhea.  Genitourinary: Negative for dysuria and hematuria.  Musculoskeletal: Negative for gait problem and  neck pain.  Skin: Negative for color change.  Neurological: Negative for numbness and headaches.  Hematological: Negative for adenopathy.  Psychiatric/Behavioral: Negative for behavioral problems.  All other systems reviewed and are negative.      Allergies  Review of patient's allergies indicates no known allergies.  Home Medications   Current Outpatient Rx  Name  Route  Sig  Dispense  Refill  . albuterol (PROVENTIL HFA;VENTOLIN HFA) 108 (90 BASE) MCG/ACT inhaler   Inhalation   Inhale 1-2 puffs into the lungs every 6 (six) hours as needed for wheezing or shortness of breath.         Marland Kitchen aspirin-sod bicarb-citric acid (ALKA-SELTZER) 325 MG TBEF tablet   Oral   Take 325 mg by mouth daily as needed (for indigestion).         . bismuth subsalicylate (PEPTO BISMOL) 262 MG/15ML suspension   Oral   Take 30 mLs by mouth every 6 (six) hours as needed for indigestion.         Marland Kitchen oxycodone (OXY-IR) 5 MG capsule   Oral   Take 5 mg by mouth every 4 (four) hours as needed (pain).         Marland Kitchen PRESCRIPTION MEDICATION   Inhalation   Inhale 1 capsule into the lungs 2 (two) times daily. SEROFLO ROTA CAP 250/50MCG          BP 117/76  Pulse 96  Temp(Src) 98.6 F (37 C) (Oral)  Resp 16  SpO2 93% Physical Exam  Nursing note and vitals reviewed. Constitutional: He is oriented  to person, place, and time. He appears well-developed and well-nourished.  HENT:  Head: Normocephalic and atraumatic.  Right Ear: External ear normal.  Left Ear: External ear normal.  Nose: Nose normal.  Mouth/Throat: Oropharynx is clear and moist. No oropharyngeal exudate.  Eyes: Conjunctivae and EOM are normal. Pupils are equal, round, and reactive to light.  Neck: Normal range of motion. Neck supple.  Cardiovascular: Normal rate, regular rhythm, normal heart sounds and intact distal pulses.  Exam reveals no gallop and no friction rub.   No murmur heard. Pulmonary/Chest: Effort normal and breath sounds  normal. No respiratory distress. He has no wheezes.  Abdominal: Soft. Bowel sounds are normal. He exhibits no distension. There is tenderness (mild epig ttp). There is no rebound and no guarding.  Musculoskeletal: Normal range of motion. He exhibits no edema and no tenderness.  Neurological: He is alert and oriented to person, place, and time.  Skin: Skin is warm and dry.  Psychiatric: He has a normal mood and affect. His behavior is normal.    ED Course  Procedures (including critical care time) Labs Review Labs Reviewed  CBC WITH DIFFERENTIAL - Abnormal; Notable for the following:    RBC 4.08 (*)    HCT 38.0 (*)    Neutrophils Relative % 79 (*)    Neutro Abs 7.8 (*)    All other components within normal limits  COMPREHENSIVE METABOLIC PANEL - Abnormal; Notable for the following:    Glucose, Bld 122 (*)    Albumin 2.9 (*)    AST 71 (*)    ALT 64 (*)    GFR calc non Af Amer 61 (*)    GFR calc Af Amer 71 (*)    All other components within normal limits  LIPASE, BLOOD  URINALYSIS, ROUTINE W REFLEX MICROSCOPIC   Imaging Review Dg Chest 2 View  07/16/2013   CLINICAL DATA:  Shortness of breath.  EXAM: CHEST  2 VIEW  COMPARISON:  07/12/2013  FINDINGS: Small bilateral pleural effusions and bibasilar atelectasis again noted.  Cardiomediastinal silhouette is stable.  There is no evidence of airspace disease or pneumothorax.  No acute bony abnormalities are noted.  IMPRESSION: Unchanged chest radiograph with small bilateral pleural effusions and bibasilar atelectasis.   Electronically Signed   By: Hassan Rowan M.D.   On: 07/16/2013 22:12    EKG Interpretation    Date/Time:  Wednesday July 16 2013 22:02:33 EST Ventricular Rate:  85 PR Interval:  147 QRS Duration: 86 QT Interval:  415 QTC Calculation: 493 R Axis:   8 Text Interpretation:  Atrial-paced complexes Borderline prolonged QT interval Confirmed by Rajendra Spiller  MD, Lillias Difrancesco ([id number]) on 07/16/2013 10:08:06 PM            MDM    Final diagnoses:  Intermittent fever  SOB (shortness of breath)  Seasonal asthma    10:04 PM 68 y.o. male with recent diagnosis of pancreatitis presents with intermittent fevers. The patient was admitted recently for treatment of his pancreatitis. During his inpatient stay he was noted to be febrile. It was thought that these fevers were associated w/ resolving pancreatitis. Wife notes that while his pain is controlled with oral narcotics at home he has continued to have an intermittent low-grade temperature with a max temp of 100.4 at home. He has been tolerating oral intake at home. He denies any cough but states that he gets short of breath in the winter and has been using an albuterol inhaler intermittently. He denies dysuria. He  currently rates his epigastric pain a 6/10. He is afebrile and vital signs are unremarkable here. Rechecked his temperature on my exam and is 99.3. His abdomen is soft and benign. He has had 2 CTs of his abdomen recently. Will get screening labwork, urinalysis, chest x-ray. Will get IV Dilaudid for pain control. I do not think a repeat CT scan is necessary at this time given his abdominal symptoms are slowly resolving, he is tolerating po well, and most recent repeat CT showed resolving pancreatic fluid collection. Also neg LE dopplers during his admission.  Wife concerned that pt has sob which has been worse w/ exertion over the last few weeks. The patient notes that he has this every winter and is consistent with his seasonal asthma. I ambulated him and his O2 saturation decreased to 88-89%. I then gave him an albuterol treatment. After the treatment I heard some mild expiratory wheezing. Upon repeat ambulation, the patient is able to walk briskly around the department and maintain his oxygen saturation above 90%. I suspect his shortness of breath is related to seasonal asthma. Will recommend continued use of his inhaler.  12:11 AM:  I have discussed the  diagnosis/risks/treatment options with the patient and believe the pt to be eligible for discharge home to follow-up with his pcp in the next 1-2 days. We also discussed returning to the ED immediately if new or worsening sx occur. We discussed the sx which are most concerning (e.g., worsening pain, worsening fever, inability to tolerate po) that necessitate immediate return. Medications administered to the patient during their visit and any new prescriptions provided to the patient are listed below.  Medications given during this visit Medications  sodium chloride 0.9 % bolus 1,000 mL (1,000 mLs Intravenous New Bag/Given 07/16/13 2202)  HYDROmorphone (DILAUDID) injection 1 mg (1 mg Intravenous Given 07/16/13 2203)  albuterol (PROVENTIL) (2.5 MG/3ML) 0.083% nebulizer solution 5 mg (5 mg Nebulization Given 07/16/13 2350)    New Prescriptions   No medications on file     Blanchard Kelch, MD 07/17/13 [phone]

## 2013-07-16 NOTE — ED Notes (Signed)
Per pt, has pancreatitis dx at [facility].  Pt states abdominal pain has gotten worse.

## 2013-07-16 NOTE — ED Notes (Signed)
Pt ambulated, O2 reading was 89%. Dr. Aline Brochure made aware.

## 2013-07-17 ENCOUNTER — Emergency Department (HOSPITAL_COMMUNITY)
Admission: EM | Admit: 2013-07-17 | Discharge: 2013-07-17 | Disposition: A | Discharge status: Home / Self Care | Payer: Medicare Other | Attending: Emergency Medicine | Admitting: Emergency Medicine

## 2013-07-17 ENCOUNTER — Encounter (HOSPITAL_COMMUNITY): Payer: Self-pay | Admitting: Emergency Medicine

## 2013-07-17 DIAGNOSIS — Z8719 Personal history of other diseases of the digestive system: Secondary | ICD-10-CM | POA: Insufficient documentation

## 2013-07-17 DIAGNOSIS — Z862 Personal history of diseases of the blood and blood-forming organs and certain disorders involving the immune mechanism: Secondary | ICD-10-CM | POA: Diagnosis not present

## 2013-07-17 DIAGNOSIS — J45998 Other asthma: Secondary | ICD-10-CM | POA: Diagnosis present

## 2013-07-17 DIAGNOSIS — Z8639 Personal history of other endocrine, nutritional and metabolic disease: Secondary | ICD-10-CM | POA: Insufficient documentation

## 2013-07-17 DIAGNOSIS — R0602 Shortness of breath: Secondary | ICD-10-CM | POA: Diagnosis not present

## 2013-07-17 DIAGNOSIS — R52 Pain, unspecified: Secondary | ICD-10-CM | POA: Insufficient documentation

## 2013-07-17 DIAGNOSIS — R1084 Generalized abdominal pain: Secondary | ICD-10-CM | POA: Diagnosis not present

## 2013-07-17 DIAGNOSIS — R109 Unspecified abdominal pain: Secondary | ICD-10-CM | POA: Diagnosis not present

## 2013-07-17 DIAGNOSIS — J45901 Unspecified asthma with (acute) exacerbation: Secondary | ICD-10-CM | POA: Insufficient documentation

## 2013-07-17 DIAGNOSIS — Z79899 Other long term (current) drug therapy: Secondary | ICD-10-CM | POA: Diagnosis not present

## 2013-07-17 DIAGNOSIS — R509 Fever, unspecified: Secondary | ICD-10-CM | POA: Diagnosis present

## 2013-07-17 NOTE — ED Notes (Signed)
Bed: ZT24 Expected date:  Expected time:  Means of arrival:  Comments: EMS 33 M abd pain, seen last night

## 2013-07-17 NOTE — ED Notes (Signed)
MD at bedside. 

## 2013-07-17 NOTE — Discharge Instructions (Signed)
Asthma, Adult Asthma is a recurring condition in which the airways tighten and narrow. Asthma can make it difficult to breathe. It can cause coughing, wheezing, and shortness of breath. Asthma episodes (also called asthma attacks) range from minor to life-threatening. Asthma cannot be cured, but medicines and lifestyle changes can help control it. CAUSES Asthma is believed to be caused by inherited (genetic) and environmental factors, but its exact cause is unknown. Asthma may be triggered by allergens, lung infections, or irritants in the air. Asthma triggers are different for each person. Common triggers include:   Animal dander.  Dust mites.  Cockroaches.  Pollen from trees or grass.  Mold.  Smoke.  Air pollutants such as dust, household cleaners, hair sprays, aerosol sprays, paint fumes, strong chemicals, or strong odors.  Cold air, weather changes, and winds (which increase molds and pollens in the air).  Strong emotional expressions such as crying or laughing hard.  Stress.  Certain medicines (such as aspirin) or types of drugs (such as beta-blockers).  Sulfites in foods and drinks. Foods and drinks that may contain sulfites include dried fruit, potato chips, and sparkling grape juice.  Infections or inflammatory conditions such as the flu, a cold, or an inflammation of the nasal membranes (rhinitis).  Gastroesophageal reflux disease (GERD).  Exercise or strenuous activity. SYMPTOMS Symptoms may occur immediately after asthma is triggered or many hours later. Symptoms include:  Wheezing.  Excessive nighttime or early morning coughing.  Frequent or severe coughing with a common cold.  Chest tightness.  Shortness of breath. DIAGNOSIS  The diagnosis of asthma is made by a review of your medical history and a physical exam. Tests may also be performed. These may include:  Lung function studies. These tests show how much air you breath in and out.  Allergy  tests.  Imaging tests such as X-rays. TREATMENT  Asthma cannot be cured, but it can usually be controlled. Treatment involves identifying and avoiding your asthma triggers. It also involves medicines. There are 2 classes of medicine used for asthma treatment:   Controller medicines. These prevent asthma symptoms from occurring. They are usually taken every day.  Reliever or rescue medicines. These quickly relieve asthma symptoms. They are used as needed and provide short-term relief. Your health care provider will help you create an asthma action plan. An asthma action plan is a written plan for managing and treating your asthma attacks. It includes a list of your asthma triggers and how they may be avoided. It also includes information on when medicines should be taken and when their dosage should be changed. An action plan may also involve the use of a device called a peak flow meter. A peak flow meter measures how well the lungs are working. It helps you monitor your condition. HOME CARE INSTRUCTIONS   Take medicine as directed by your health care provider. Speak with your health care provider if you have questions about how or when to take the medicines.  Use a peak flow meter as directed by your health care provider. Record and keep track of readings.  Understand and use the action plan to help minimize or stop an asthma attack without needing to seek medical care.  Control your home environment in the following ways to help prevent asthma attacks:  Do not smoke. Avoid being exposed to secondhand smoke.  Change your heating and air conditioning filter regularly.  Limit your use of fireplaces and wood stoves.  Get rid of pests (such as roaches and   mice) and their droppings.  Throw away plants if you see mold on them.  Clean your floors and dust regularly. Use unscented cleaning products.  Try to have someone else vacuum for you regularly. Stay out of rooms while they are being  vacuumed and for a short while afterward. If you vacuum, use a dust mask from a hardware store, a double-layered or microfilter vacuum cleaner bag, or a vacuum cleaner with a HEPA filter.  Replace carpet with wood, tile, or vinyl flooring. Carpet can trap dander and dust.  Use allergy-proof pillows, mattress covers, and box spring covers.  Wash bed sheets and blankets every week in hot water and dry them in a dryer.  Use blankets that are made of polyester or cotton.  Clean bathrooms and kitchens with bleach. If possible, have someone repaint the walls in these rooms with mold-resistant paint. Keep out of the rooms that are being cleaned and painted.  Wash hands frequently. SEEK MEDICAL CARE IF:   You have wheezing, shortness of breath, or a cough even if taking medicine to prevent attacks.  The colored mucus you cough up (sputum) is thicker than usual.  Your sputum changes from clear or white to yellow, green, gray, or bloody.  You have any problems that may be related to the medicines you are taking (such as a rash, itching, swelling, or trouble breathing).  You are using a reliever medicine more than 2 3 times per week.  Your peak flow is still at 50 79% of you personal best after following your action plan for 1 hour. SEEK IMMEDIATE MEDICAL CARE IF:   You seem to be getting worse and are unresponsive to treatment during an asthma attack.  You are short of breath even at rest.  You get short of breath when doing very little physical activity.  You have difficulty eating, drinking, or talking due to asthma symptoms.  You develop chest pain.  You develop a fast heartbeat.  You have a bluish color to your lips or fingernails.  You are lightheaded, dizzy, or faint.  Your peak flow is less than 50% of your personal best.  You have a fever or persistent symptoms for more than 2 3 days.  You have a fever and symptoms suddenly get worse. MAKE SURE YOU:   Understand these  instructions.  Will watch your condition.  Will get help right away if you are not doing well or get worse. Document Released: 05/08/2005 Document Revised: 01/08/2013 Document Reviewed: 12/05/2012 [employer] Patient Information 2014 [employer].  

## 2013-07-17 NOTE — ED Notes (Signed)
Per EMS, Pt was discharged from [facility] at 0200.  [facility] took the PT to his daughter's house, because his wife would not answer the phone at his home.  Pt's pain medications are at his home and the daughter refused to take him there, so she called EMS for the Pt to return for pain management.  EMS sts "the Pt did not want to come back, but the daughter would not let him back in her house."  Per night shift staff, the Pt's wife was adamant that the Pt be admitted and was not answering her phone last night.  Pt does not meet criteria for [facility] transport.

## 2013-07-17 NOTE — ED Provider Notes (Signed)
CSN: [id number]     Arrival date & time 07/17/13  0277 History   First MD Initiated Contact with Patient 07/17/13 [phone]     Chief Complaint  Patient presents with  . Pain Management     HPI Patient was seen and evaluated in the emergency department last night for some mild shortness of breath in the setting of a history of seasonal asthma.  He felt better after albuterol and was discharged home.  It sounds like there was difficulty when he was trying to get back into his house.  His wife would not answer the phone and therefore [facility] would not take him back to his house.  He was taken to his daughter's house.  He states that he didn't sleep because of generalized discomfort and pain.  His daughter was not able to take him to his house to get his pain medicine because she does not drive and is now.  He presents back to the emergency department now because he states he still unable to get into his house and retrieve his pain medicine.  He states his breathing is much better and he does not feel short of breath at this time.  He denies abdominal discomfort at this time.  He denies nausea and vomiting.   Past Medical History  Diagnosis Date  . Seasonal asthma   . Pancreatitis, acute 07/08/2013  . Gout attack 1990   Past Surgical History  Procedure Laterality Date  . Circumcision  ~ 1974  . Eye examination under anesthesia w/ retinal cryotherapy and retinal laser Right 2014    "for tear"   No family history on file. History  Substance Use Topics  . Smoking status: Never Smoker   . Smokeless tobacco: Never Used  . Alcohol Use: 1.8 oz/week    2 Cans of beer, 1 Shots of liquor per week     Comment: 07/08/2013    Review of Systems  All other systems reviewed and are negative.      Allergies  Review of patient's allergies indicates no known allergies.  Home Medications   Current Outpatient Rx  Name  Route  Sig  Dispense  Refill  . albuterol (PROVENTIL HFA;VENTOLIN HFA) 108 (90 BASE)  MCG/ACT inhaler   Inhalation   Inhale 1-2 puffs into the lungs every 6 (six) hours as needed for wheezing or shortness of breath.         Marland Kitchen aspirin-sod bicarb-citric acid (ALKA-SELTZER) 325 MG TBEF tablet   Oral   Take 325 mg by mouth daily as needed (for indigestion).         . bismuth subsalicylate (PEPTO BISMOL) 262 MG/15ML suspension   Oral   Take 30 mLs by mouth every 6 (six) hours as needed for indigestion.         Marland Kitchen oxycodone (OXY-IR) 5 MG capsule   Oral   Take 5 mg by mouth every 4 (four) hours as needed (pain).         Marland Kitchen PRESCRIPTION MEDICATION   Inhalation   Inhale 1 capsule into the lungs 2 (two) times daily. SEROFLO ROTA CAP 250/50MCG          BP 128/76  Pulse 96  Temp(Src) 99.1 F (37.3 C) (Oral)  Resp 16  SpO2 93% Physical Exam  Nursing note and vitals reviewed. Constitutional: He is oriented to person, place, and time. He appears well-developed and well-nourished. No distress.  HENT:  Head: Normocephalic.  Eyes: EOM are normal.  Neck: Normal range  of motion.  Pulmonary/Chest: Effort normal.  Abdominal: He exhibits no distension.  Musculoskeletal: Normal range of motion.  Neurological: He is alert and oriented to person, place, and time.  Psychiatric: He has a normal mood and affect.    ED Course  Procedures (including critical care time) Labs Review Labs Reviewed - No data to display Imaging Review Dg Chest 2 View  07/16/2013   CLINICAL DATA:  Shortness of breath.  EXAM: CHEST  2 VIEW  COMPARISON:  07/12/2013  FINDINGS: Small bilateral pleural effusions and bibasilar atelectasis again noted.  Cardiomediastinal silhouette is stable.  There is no evidence of airspace disease or pneumothorax.  No acute bony abnormalities are noted.  IMPRESSION: Unchanged chest radiograph with small bilateral pleural effusions and bibasilar atelectasis.   Electronically Signed   By: Hassan Rowan M.D.   On: 07/16/2013 22:12  I personally reviewed the imaging tests  through PACS system I reviewed available ER/hospitalization records through the EMR   EKG Interpretation   None       MDM   Final diagnoses:  SOB (shortness of breath)    MSE completed. Workup yesterday was adequate. Recommend taxi home and if wife won't let the pt back into the house, to call Police.  Well-appearing.    Hoy Morn, MD 07/17/13 [phone]

## 2013-07-29 DIAGNOSIS — K859 Acute pancreatitis without necrosis or infection, unspecified: Secondary | ICD-10-CM | POA: Diagnosis not present

## 2013-07-29 DIAGNOSIS — Z09 Encounter for follow-up examination after completed treatment for conditions other than malignant neoplasm: Secondary | ICD-10-CM | POA: Diagnosis not present

## 2013-07-29 DIAGNOSIS — R932 Abnormal findings on diagnostic imaging of liver and biliary tract: Secondary | ICD-10-CM | POA: Diagnosis not present

## 2013-07-29 DIAGNOSIS — J45909 Unspecified asthma, uncomplicated: Secondary | ICD-10-CM | POA: Diagnosis not present

## 2013-08-01 ENCOUNTER — Encounter (INDEPENDENT_AMBULATORY_CARE_PROVIDER_SITE_OTHER): Payer: Self-pay | Admitting: Surgery

## 2013-08-07 ENCOUNTER — Ambulatory Visit (INDEPENDENT_AMBULATORY_CARE_PROVIDER_SITE_OTHER): Payer: Medicare Other | Admitting: Surgery

## 2013-08-12 ENCOUNTER — Encounter (INDEPENDENT_AMBULATORY_CARE_PROVIDER_SITE_OTHER): Payer: Self-pay | Admitting: Surgery

## 2013-09-29 ENCOUNTER — Ambulatory Visit (INDEPENDENT_AMBULATORY_CARE_PROVIDER_SITE_OTHER): Payer: Medicare Other | Admitting: Surgery

## 2013-11-24 DIAGNOSIS — J45909 Unspecified asthma, uncomplicated: Secondary | ICD-10-CM | POA: Diagnosis not present

## 2013-11-24 DIAGNOSIS — Z8719 Personal history of other diseases of the digestive system: Secondary | ICD-10-CM | POA: Diagnosis not present

## 2013-11-24 DIAGNOSIS — R932 Abnormal findings on diagnostic imaging of liver and biliary tract: Secondary | ICD-10-CM | POA: Diagnosis not present

## 2013-11-24 DIAGNOSIS — Z1211 Encounter for screening for malignant neoplasm of colon: Secondary | ICD-10-CM | POA: Diagnosis not present

## 2013-12-11 DIAGNOSIS — Z23 Encounter for immunization: Secondary | ICD-10-CM | POA: Diagnosis not present

## 2013-12-11 DIAGNOSIS — L259 Unspecified contact dermatitis, unspecified cause: Secondary | ICD-10-CM | POA: Diagnosis not present

## 2013-12-11 DIAGNOSIS — Z136 Encounter for screening for cardiovascular disorders: Secondary | ICD-10-CM | POA: Diagnosis not present

## 2013-12-11 DIAGNOSIS — Z0000 Encounter for general adult medical examination without abnormal findings: Secondary | ICD-10-CM | POA: Diagnosis not present

## 2013-12-11 DIAGNOSIS — Z125 Encounter for screening for malignant neoplasm of prostate: Secondary | ICD-10-CM | POA: Diagnosis not present

## 2013-12-11 DIAGNOSIS — Z1211 Encounter for screening for malignant neoplasm of colon: Secondary | ICD-10-CM | POA: Diagnosis not present

## 2013-12-11 DIAGNOSIS — N509 Disorder of male genital organs, unspecified: Secondary | ICD-10-CM | POA: Diagnosis not present

## 2014-03-25 ENCOUNTER — Inpatient Hospital Stay (HOSPITAL_COMMUNITY)
Admission: EM | Admit: 2014-03-25 | Discharge: 2014-04-02 | DRG: 417 | Disposition: A | Discharge status: Home / Self Care | Payer: Medicare Other | Attending: Internal Medicine | Admitting: Internal Medicine

## 2014-03-25 ENCOUNTER — Encounter (HOSPITAL_COMMUNITY): Payer: Self-pay | Admitting: *Deleted

## 2014-03-25 DIAGNOSIS — K859 Acute pancreatitis without necrosis or infection, unspecified: Secondary | ICD-10-CM

## 2014-03-25 DIAGNOSIS — R1031 Right lower quadrant pain: Secondary | ICD-10-CM | POA: Diagnosis not present

## 2014-03-25 DIAGNOSIS — R0602 Shortness of breath: Secondary | ICD-10-CM

## 2014-03-25 DIAGNOSIS — H6691 Otitis media, unspecified, right ear: Secondary | ICD-10-CM | POA: Clinically undetermined

## 2014-03-25 DIAGNOSIS — K819 Cholecystitis, unspecified: Secondary | ICD-10-CM

## 2014-03-25 DIAGNOSIS — K8020 Calculus of gallbladder without cholecystitis without obstruction: Secondary | ICD-10-CM | POA: Diagnosis not present

## 2014-03-25 DIAGNOSIS — Z791 Long term (current) use of non-steroidal anti-inflammatories (NSAID): Secondary | ICD-10-CM

## 2014-03-25 DIAGNOSIS — E663 Overweight: Secondary | ICD-10-CM | POA: Diagnosis present

## 2014-03-25 DIAGNOSIS — Z87898 Personal history of other specified conditions: Secondary | ICD-10-CM

## 2014-03-25 DIAGNOSIS — K851 Biliary acute pancreatitis without necrosis or infection: Secondary | ICD-10-CM

## 2014-03-25 DIAGNOSIS — R1011 Right upper quadrant pain: Secondary | ICD-10-CM

## 2014-03-25 DIAGNOSIS — F1010 Alcohol abuse, uncomplicated: Secondary | ICD-10-CM | POA: Diagnosis present

## 2014-03-25 DIAGNOSIS — R509 Fever, unspecified: Secondary | ICD-10-CM

## 2014-03-25 DIAGNOSIS — K8080 Other cholelithiasis without obstruction: Secondary | ICD-10-CM | POA: Diagnosis not present

## 2014-03-25 DIAGNOSIS — K279 Peptic ulcer, site unspecified, unspecified as acute or chronic, without hemorrhage or perforation: Secondary | ICD-10-CM | POA: Diagnosis present

## 2014-03-25 DIAGNOSIS — Z7982 Long term (current) use of aspirin: Secondary | ICD-10-CM

## 2014-03-25 DIAGNOSIS — J45909 Unspecified asthma, uncomplicated: Secondary | ICD-10-CM | POA: Diagnosis present

## 2014-03-25 DIAGNOSIS — Z6825 Body mass index (BMI) 25.0-25.9, adult: Secondary | ICD-10-CM

## 2014-03-25 DIAGNOSIS — R748 Abnormal levels of other serum enzymes: Secondary | ICD-10-CM | POA: Diagnosis present

## 2014-03-25 DIAGNOSIS — Z79899 Other long term (current) drug therapy: Secondary | ICD-10-CM

## 2014-03-25 DIAGNOSIS — K5900 Constipation, unspecified: Secondary | ICD-10-CM | POA: Diagnosis present

## 2014-03-25 DIAGNOSIS — E876 Hypokalemia: Secondary | ICD-10-CM | POA: Diagnosis not present

## 2014-03-25 DIAGNOSIS — R1010 Upper abdominal pain, unspecified: Secondary | ICD-10-CM

## 2014-03-25 LAB — COMPREHENSIVE METABOLIC PANEL
ALT: 53 U/L (ref 0–53)
AST: 80 U/L — ABNORMAL HIGH (ref 0–37)
Albumin: 3.7 g/dL (ref 3.5–5.2)
Alkaline Phosphatase: 73 U/L (ref 39–117)
Anion gap: 12 (ref 5–15)
BUN: 20 mg/dL (ref 6–23)
CO2: 26 mEq/L (ref 19–32)
Calcium: 9.3 mg/dL (ref 8.4–10.5)
Chloride: 106 mEq/L (ref 96–112)
Creatinine, Ser: 1.35 mg/dL (ref 0.50–1.35)
GFR calc Af Amer: 61 mL/min — ABNORMAL LOW (ref >90)
GFR calc non Af Amer: 53 mL/min — ABNORMAL LOW (ref >90)
Glucose, Bld: 102 mg/dL — ABNORMAL HIGH (ref 70–99)
Potassium: 3.7 mEq/L (ref 3.7–5.3)
Sodium: 144 mEq/L (ref 137–147)
Total Bilirubin: 1.8 mg/dL — ABNORMAL HIGH (ref 0.3–1.2)
Total Protein: 7.1 g/dL (ref 6.0–8.3)

## 2014-03-25 LAB — CBC WITH DIFFERENTIAL/PLATELET
Basophils Absolute: 0.1 10*3/uL (ref 0.0–0.1)
Basophils Relative: 1 % (ref 0–1)
Eosinophils Absolute: 0 10*3/uL (ref 0.0–0.7)
Eosinophils Relative: 0 % (ref 0–5)
HCT: 42.3 % (ref 39.0–52.0)
Hemoglobin: 14.7 g/dL (ref 13.0–17.0)
Lymphocytes Relative: 31 % (ref 12–46)
Lymphs Abs: 2 10*3/uL (ref 0.7–4.0)
MCH: 32.1 pg (ref 26.0–34.0)
MCHC: 34.8 g/dL (ref 30.0–36.0)
MCV: 92.4 fL (ref 78.0–100.0)
Monocytes Absolute: 0.4 10*3/uL (ref 0.1–1.0)
Monocytes Relative: 7 % (ref 3–12)
Neutro Abs: 3.9 10*3/uL (ref 1.7–7.7)
Neutrophils Relative %: 61 % (ref 43–77)
Platelets: 183 10*3/uL (ref 150–400)
RBC: 4.58 MIL/uL (ref 4.22–5.81)
RDW: 13.3 % (ref 11.5–15.5)
WBC: 6.4 10*3/uL (ref 4.0–10.5)

## 2014-03-25 LAB — TROPONIN I: Troponin I: <0.3 ng/mL (ref <0.30)

## 2014-03-25 MED ORDER — ONDANSETRON 4 MG PO TBDP
8.0000 mg | ORAL_TABLET | Freq: Once | ORAL | Status: AC
  Administered 2014-03-25: 8 mg via ORAL
  Filled 2014-03-25: qty 2

## 2014-03-25 NOTE — ED Notes (Signed)
The pt started having upper abd pain.  He felt like he had  Indigestion and he vomited some bright red blood.

## 2014-03-26 ENCOUNTER — Emergency Department (HOSPITAL_COMMUNITY): Payer: Medicare Other

## 2014-03-26 ENCOUNTER — Encounter (HOSPITAL_COMMUNITY): Payer: Self-pay

## 2014-03-26 DIAGNOSIS — R1011 Right upper quadrant pain: Secondary | ICD-10-CM | POA: Diagnosis present

## 2014-03-26 DIAGNOSIS — F1010 Alcohol abuse, uncomplicated: Secondary | ICD-10-CM | POA: Diagnosis present

## 2014-03-26 DIAGNOSIS — K851 Biliary acute pancreatitis without necrosis or infection: Secondary | ICD-10-CM | POA: Diagnosis present

## 2014-03-26 DIAGNOSIS — K8591 Acute pancreatitis with uninfected necrosis, unspecified: Secondary | ICD-10-CM

## 2014-03-26 DIAGNOSIS — Z791 Long term (current) use of non-steroidal anti-inflammatories (NSAID): Secondary | ICD-10-CM | POA: Diagnosis not present

## 2014-03-26 DIAGNOSIS — K8020 Calculus of gallbladder without cholecystitis without obstruction: Secondary | ICD-10-CM

## 2014-03-26 DIAGNOSIS — R748 Abnormal levels of other serum enzymes: Secondary | ICD-10-CM | POA: Diagnosis present

## 2014-03-26 DIAGNOSIS — K859 Acute pancreatitis, unspecified: Secondary | ICD-10-CM | POA: Diagnosis not present

## 2014-03-26 DIAGNOSIS — Z87898 Personal history of other specified conditions: Secondary | ICD-10-CM

## 2014-03-26 DIAGNOSIS — K279 Peptic ulcer, site unspecified, unspecified as acute or chronic, without hemorrhage or perforation: Secondary | ICD-10-CM | POA: Diagnosis present

## 2014-03-26 DIAGNOSIS — H65191 Other acute nonsuppurative otitis media, right ear: Secondary | ICD-10-CM | POA: Diagnosis not present

## 2014-03-26 DIAGNOSIS — K8010 Calculus of gallbladder with chronic cholecystitis without obstruction: Secondary | ICD-10-CM | POA: Diagnosis not present

## 2014-03-26 DIAGNOSIS — J45909 Unspecified asthma, uncomplicated: Secondary | ICD-10-CM | POA: Diagnosis present

## 2014-03-26 DIAGNOSIS — R509 Fever, unspecified: Secondary | ICD-10-CM | POA: Diagnosis not present

## 2014-03-26 DIAGNOSIS — K5900 Constipation, unspecified: Secondary | ICD-10-CM | POA: Diagnosis not present

## 2014-03-26 DIAGNOSIS — E663 Overweight: Secondary | ICD-10-CM | POA: Diagnosis not present

## 2014-03-26 DIAGNOSIS — K819 Cholecystitis, unspecified: Secondary | ICD-10-CM | POA: Diagnosis not present

## 2014-03-26 DIAGNOSIS — R918 Other nonspecific abnormal finding of lung field: Secondary | ICD-10-CM | POA: Diagnosis not present

## 2014-03-26 DIAGNOSIS — Z79899 Other long term (current) drug therapy: Secondary | ICD-10-CM | POA: Diagnosis not present

## 2014-03-26 DIAGNOSIS — Z7982 Long term (current) use of aspirin: Secondary | ICD-10-CM | POA: Diagnosis not present

## 2014-03-26 DIAGNOSIS — R0602 Shortness of breath: Secondary | ICD-10-CM | POA: Diagnosis not present

## 2014-03-26 DIAGNOSIS — E876 Hypokalemia: Secondary | ICD-10-CM | POA: Diagnosis not present

## 2014-03-26 DIAGNOSIS — Z6825 Body mass index (BMI) 25.0-25.9, adult: Secondary | ICD-10-CM | POA: Diagnosis not present

## 2014-03-26 HISTORY — DX: Acute pancreatitis with uninfected necrosis, unspecified: K85.91

## 2014-03-26 LAB — CBC WITH DIFFERENTIAL/PLATELET
Basophils Absolute: 0 10*3/uL (ref 0.0–0.1)
Basophils Relative: 0 % (ref 0–1)
Eosinophils Absolute: 0 10*3/uL (ref 0.0–0.7)
Eosinophils Relative: 0 % (ref 0–5)
HCT: 45.1 % (ref 39.0–52.0)
Hemoglobin: 15.8 g/dL (ref 13.0–17.0)
Lymphocytes Relative: 4 % — ABNORMAL LOW (ref 12–46)
Lymphs Abs: 0.4 10*3/uL — ABNORMAL LOW (ref 0.7–4.0)
MCH: 32.5 pg (ref 26.0–34.0)
MCHC: 35 g/dL (ref 30.0–36.0)
MCV: 92.8 fL (ref 78.0–100.0)
Monocytes Absolute: 0.3 10*3/uL (ref 0.1–1.0)
Monocytes Relative: 3 % (ref 3–12)
Neutro Abs: 10.2 10*3/uL — ABNORMAL HIGH (ref 1.7–7.7)
Neutrophils Relative %: 93 % — ABNORMAL HIGH (ref 43–77)
Platelets: 163 10*3/uL (ref 150–400)
RBC: 4.86 MIL/uL (ref 4.22–5.81)
RDW: 13.4 % (ref 11.5–15.5)
WBC: 10.9 10*3/uL — ABNORMAL HIGH (ref 4.0–10.5)

## 2014-03-26 LAB — URINALYSIS, ROUTINE W REFLEX MICROSCOPIC
Glucose, UA: NEGATIVE mg/dL
Hgb urine dipstick: NEGATIVE
Ketones, ur: 15 mg/dL — AB
Leukocytes, UA: NEGATIVE
Nitrite: NEGATIVE
Protein, ur: NEGATIVE mg/dL
Specific Gravity, Urine: 1.025 (ref 1.005–1.030)
Urobilinogen, UA: 1 mg/dL (ref 0.0–1.0)
pH: 6 (ref 5.0–8.0)

## 2014-03-26 LAB — LIPASE, BLOOD
Lipase: >3000 U/L — ABNORMAL HIGH (ref 11–59)
Lipase: 1133 U/L — ABNORMAL HIGH (ref 11–59)

## 2014-03-26 LAB — COMPREHENSIVE METABOLIC PANEL
ALT: 71 U/L — ABNORMAL HIGH (ref 0–53)
AST: 84 U/L — ABNORMAL HIGH (ref 0–37)
Albumin: 3.6 g/dL (ref 3.5–5.2)
Alkaline Phosphatase: 65 U/L (ref 39–117)
Anion gap: 13 (ref 5–15)
BUN: 17 mg/dL (ref 6–23)
CO2: 24 mEq/L (ref 19–32)
Calcium: 8.6 mg/dL (ref 8.4–10.5)
Chloride: 108 mEq/L (ref 96–112)
Creatinine, Ser: 1.17 mg/dL (ref 0.50–1.35)
GFR calc Af Amer: 73 mL/min — ABNORMAL LOW (ref >90)
GFR calc non Af Amer: 63 mL/min — ABNORMAL LOW (ref >90)
Glucose, Bld: 100 mg/dL — ABNORMAL HIGH (ref 70–99)
Potassium: 4.2 mEq/L (ref 3.7–5.3)
Sodium: 145 mEq/L (ref 137–147)
Total Bilirubin: 1.3 mg/dL — ABNORMAL HIGH (ref 0.3–1.2)
Total Protein: 6.7 g/dL (ref 6.0–8.3)

## 2014-03-26 LAB — PROTIME-INR
INR: 1.1 (ref 0.00–1.49)
Prothrombin Time: 14.3 seconds (ref 11.6–15.2)

## 2014-03-26 LAB — LACTATE DEHYDROGENASE: LDH: 338 U/L — ABNORMAL HIGH (ref 94–250)

## 2014-03-26 MED ORDER — ALBUTEROL SULFATE (2.5 MG/3ML) 0.083% IN NEBU: 2.5000 mg | INHALATION_SOLUTION | Freq: Four times a day (QID) | RESPIRATORY_TRACT | Status: DC | PRN

## 2014-03-26 MED ORDER — HYDROMORPHONE HCL 1 MG/ML IJ SOLN
1.0000 mg | INTRAMUSCULAR | Status: DC | PRN
  Administered 2014-03-26 – 2014-03-27 (×2): 2 mg via INTRAVENOUS
  Filled 2014-03-26 (×2): qty 2

## 2014-03-26 MED ORDER — ONDANSETRON HCL 4 MG/2ML IJ SOLN
4.0000 mg | Freq: Once | INTRAMUSCULAR | Status: AC
  Administered 2014-03-26: 4 mg via INTRAVENOUS
  Filled 2014-03-26: qty 2

## 2014-03-26 MED ORDER — SODIUM CHLORIDE 0.9 % IV BOLUS (SEPSIS)
1000.0000 mL | Freq: Once | INTRAVENOUS | Status: AC
  Administered 2014-03-26: 1000 mL via INTRAVENOUS

## 2014-03-26 MED ORDER — ONDANSETRON HCL 4 MG/2ML IJ SOLN
4.0000 mg | Freq: Four times a day (QID) | INTRAMUSCULAR | Status: DC | PRN
  Administered 2014-03-26: 4 mg via INTRAVENOUS
  Filled 2014-03-26: qty 2

## 2014-03-26 MED ORDER — FLUTICASONE PROPIONATE HFA 220 MCG/ACT IN AERO
1.0000 | INHALATION_SPRAY | Freq: Two times a day (BID) | RESPIRATORY_TRACT | Status: DC
  Administered 2014-03-26 – 2014-04-02 (×15): 1 via RESPIRATORY_TRACT
  Filled 2014-03-26: qty 12

## 2014-03-26 MED ORDER — HYDROMORPHONE HCL 1 MG/ML IJ SOLN
0.5000 mg | INTRAMUSCULAR | Status: DC | PRN
  Administered 2014-03-26 (×3): 0.5 mg via INTRAVENOUS
  Filled 2014-03-26 (×2): qty 1

## 2014-03-26 MED ORDER — SODIUM CHLORIDE 0.9 % IV SOLN
Freq: Once | INTRAVENOUS | Status: AC
  Administered 2014-03-26: 03:00:00 via INTRAVENOUS

## 2014-03-26 MED ORDER — HEPARIN SODIUM (PORCINE) 5000 UNIT/ML IJ SOLN
5000.0000 [IU] | Freq: Three times a day (TID) | INTRAMUSCULAR | Status: DC
  Administered 2014-03-26 – 2014-04-02 (×16): 5000 [IU] via SUBCUTANEOUS
  Filled 2014-03-26 (×23): qty 1

## 2014-03-26 MED ORDER — HYDROMORPHONE HCL 1 MG/ML IJ SOLN
1.0000 mg | Freq: Once | INTRAMUSCULAR | Status: AC
  Administered 2014-03-26: 1 mg via INTRAVENOUS
  Filled 2014-03-26: qty 1

## 2014-03-26 MED ORDER — ACETAMINOPHEN 650 MG RE SUPP: 650.0000 mg | Freq: Four times a day (QID) | RECTAL | Status: DC | PRN

## 2014-03-26 MED ORDER — ONDANSETRON HCL 4 MG PO TABS: 4.0000 mg | ORAL_TABLET | Freq: Four times a day (QID) | ORAL | Status: DC | PRN

## 2014-03-26 MED ORDER — SODIUM CHLORIDE 0.9 % IV SOLN
INTRAVENOUS | Status: DC
  Administered 2014-03-26 – 2014-03-29 (×6): via INTRAVENOUS

## 2014-03-26 MED ORDER — HYDROMORPHONE HCL 1 MG/ML IJ SOLN
INTRAMUSCULAR | Status: AC
  Filled 2014-03-26: qty 1

## 2014-03-26 MED ORDER — ACETAMINOPHEN 325 MG PO TABS
650.0000 mg | ORAL_TABLET | Freq: Four times a day (QID) | ORAL | Status: DC | PRN
  Administered 2014-03-27 – 2014-04-01 (×12): 650 mg via ORAL
  Filled 2014-03-26 (×12): qty 2

## 2014-03-26 MED ORDER — GI COCKTAIL ~~LOC~~
30.0000 mL | Freq: Once | ORAL | Status: AC
  Administered 2014-03-26: 30 mL via ORAL
  Filled 2014-03-26: qty 30

## 2014-03-26 NOTE — H&P (Addendum)
[facility] History and Physical  Patient: Mason [name]  EZM:[id number]  DOB: 07-17-1945  DOS: the patient was seen and examined on 03/26/2014 PCP: No PCP Per Patient  Chief Complaint: abdominal pain  HPI: Mason [name] is a 68 y.o. male with Past medical history of pancreatitis. The patient presented with complaints of abdominal pain that started earlier this morning and progressively worsen. Pain is located in the upper abdominal area in the central abdomen and feels like a burning sharp pain with throbbing component. He had an episode of vomiting and again used to have nausea but denies any diarrhea or constipation or active bleeding. He denies any burning urination, fever, chills, chest pain, shortness of breath. He denies any change in his medication recently.  The patient is coming from home. And at his baseline independent for most of his ADL.  Review of Systems: as mentioned in the history of present illness.  A Comprehensive review of the other systems is negative.  Past Medical History  Diagnosis Date  . Seasonal asthma   . Pancreatitis, acute 07/08/2013  . Gout attack 1990   Past Surgical History  Procedure Laterality Date  . Circumcision  ~ 1974  . Eye examination under anesthesia w/ retinal cryotherapy and retinal laser Right 2014    "for tear"   Social History:  reports that he has never smoked. He has never used smokeless tobacco. He reports that he drinks about 1.8 oz of alcohol per week. He reports that he does not use illicit drugs.  No Known Allergies  History reviewed. No pertinent family history.  Prior to Admission medications   Medication Sig Start Date End Date Taking? Authorizing Provider  albuterol (PROVENTIL HFA;VENTOLIN HFA) 108 (90 BASE) MCG/ACT inhaler Inhale 1-2 puffs into the lungs every 6 (six) hours as needed for wheezing or shortness of breath.   Yes Historical Provider, MD  aspirin 500 MG tablet Take 1,000 mg by mouth every 6 (six) hours  as needed for pain.   Yes Historical Provider, MD  fluticasone (FLOVENT HFA) 220 MCG/ACT inhaler Inhale 1 puff into the lungs 2 (two) times daily.   Yes Historical Provider, MD  ibuprofen (ADVIL,MOTRIN) 200 MG tablet Take 400 mg by mouth every 6 (six) hours as needed for moderate pain.   Yes Historical Provider, MD    Physical Exam: Filed Vitals:   03/26/14 0245 03/26/14 0315 03/26/14 0330 03/26/14 0421  BP: 142/88 168/93 137/67 144/91  Pulse: 74 83 74 79  Temp:    97.9 F (36.6 C)  TempSrc:    Oral  Resp: 20 20 23 20   Height:    6' (1.829 m)  Weight:    93.577 kg (206 lb 4.8 oz)  SpO2: 93% 92% 93% 97%    General: Alert, Awake and Oriented to Time, Place and Person. Appear in moderate distress Eyes: PERRL ENT: Oral Mucosa clear dry. Neck: no JVD Cardiovascular: S1 and S2 Present, no Murmur, Peripheral Pulses Present Respiratory: Bilateral Air entry equal and Decreased, Clear to Auscultation, noCrackles, no wheezes Abdomen: Bowel Sound present sluggish, Soft and diffusely mildly tender Skin: no Rash Extremities: no Pedal edema, no calf tenderness Neurologic: Grossly no focal neuro deficit.  Labs on Admission:  CBC:  Recent Labs Lab 03/25/14 2246  WBC 6.4  NEUTROABS 3.9  HGB 14.7  HCT 42.3  MCV 92.4  PLT 183    CMP     Component Value Date/Time   NA 144 03/25/2014 2246   K 3.7 03/25/2014 2246  CL 106 03/25/2014 2246   CO2 26 03/25/2014 2246   GLUCOSE 102* 03/25/2014 2246   BUN 20 03/25/2014 2246   CREATININE 1.35 03/25/2014 2246   CALCIUM 9.3 03/25/2014 2246   PROT 7.1 03/25/2014 2246   ALBUMIN 3.7 03/25/2014 2246   AST 80* 03/25/2014 2246   ALT 53 03/25/2014 2246   ALKPHOS 73 03/25/2014 2246   BILITOT 1.8* 03/25/2014 2246   GFRNONAA 53* 03/25/2014 2246   GFRAA 61* 03/25/2014 2246     Recent Labs Lab 03/25/14 2246  LIPASE >3000*   No results for input(s): AMMONIA in the last 168 hours.   Recent Labs Lab 03/25/14 2246  TROPONINI <0.30   BNP  (last 3 results)  Recent Labs  07/10/13 0903  PROBNP 208.5*    Radiological Exams on Admission: US Abdomen Limited  03/26/2014   CLINICAL DATA:  RIGHT upper quadrant pain.  History of pancreatitis.  EXAM: US ABDOMEN LIMITED - RIGHT UPPER QUADRANT  COMPARISON:  CT of the abdomen and pelvis July 19, 2013  FINDINGS: Gallbladder:  Multiple echogenic mobile gallstones measure up to at least 7 mm. No gallbladder wall thickening, pericholecystic fluid. No sonographic Murphy's sign was elicited.  Common bile duct:  Diameter: 5 mm  Liver:  No focal lesion identified. Within normal limits in parenchymal echogenicity. Hepatopetal portal vein.  Included view of the pancreas demonstrates ductal dilatation at 4 mm.  IMPRESSION: Cholelithiasis without sonographic findings of acute cholecystitis.  Mild pancreatic duct dilatation can be seen with acute or chronic pancreatitis.   Electronically Signed   By: Elon Alas   On: 03/26/2014 01:46     Assessment/Plan Active Problems:   Acute pancreatitis   Constipation   Cholelithiasis   History of alcohol use   1. Acute pancreatitis The patient is presenting with complaints of central abdomen pain. He is found to have significantly elevated lipase and no peritoneal signs. Ultrasound of the abdomen is positive for gallstone but no choledocholithiasis or evidence of cholecystitis. With this the patient will be admitted in the med surge unit based on his current hemodynamic status and I would give him IV fluids, keep him nothing by mouth with ice chips and give him IV Zofran and IV Protonix and IV pain medications as needed. Gen. Surgery has been consulted for the patient will be following up the patient. Patient may also require GI consultation depending on his progress in the hospital.  2.history of alcohol use patient denies any alcohol abuse, denies any alcohol use since he has been discharged from the hospital. Continue to closely  monitor.  Advance goals of care discussion: full code   Consults: general surgery  DVT Prophylaxis: subcutaneous HeparinNutrition: nothing by mouth  Disposition: Admitted to  inpatient in med-surge unit.  Author: Berle Mull, MD [facility] Pager: [phone] 03/26/2014, 6:16 AM    If 7PM-7AM, please contact night-coverage [email or web address] Password [email or web address]   Addendum: This chart is addended per patient's request which is approved, patient drank only occasionally and does not have history of alcohol abuse.  Selma Mink 8:05 AM 06/17/2014

## 2014-03-26 NOTE — Consult Note (Signed)
Reason for Consult:Abdominal pain, pancreatitis Referring Physician: [name] is an 68 y.o. male.  HPI: Acute onset of mid abdominal pain associated with nausea and vomiting.  No fevers or chills.  Had a previous episode months ago, advised to see a surgeon, but patient did not want surgery.  Never saw a Psychologist, sport and exercise.  Now his pain is a 6/10/  Past Medical History  Diagnosis Date  . Seasonal asthma   . Pancreatitis, acute 07/08/2013  . Gout attack 1990    Past Surgical History  Procedure Laterality Date  . Circumcision  ~ 1974  . Eye examination under anesthesia w/ retinal cryotherapy and retinal laser Right 2014    "for tear"    History reviewed. No pertinent family history.  Social History:  reports that he has never smoked. He has never used smokeless tobacco. He reports that he drinks about 1.8 oz of alcohol per week. He reports that he does not use illicit drugs.  Allergies: No Known Allergies  Medications: I have reviewed the patient's current medications.  Results for orders placed or performed during the hospital encounter of 03/25/14 (from the past 48 hour(s))  CBC with Differential     Status: None   Collection Time: 03/25/14 10:46 PM  Result Value Ref Range   WBC 6.4 4.0 - 10.5 K/uL   RBC 4.58 4.22 - 5.81 MIL/uL   Hemoglobin 14.7 13.0 - 17.0 g/dL   HCT 42.3 39.0 - 52.0 %   MCV 92.4 78.0 - 100.0 fL   MCH 32.1 26.0 - 34.0 pg   MCHC 34.8 30.0 - 36.0 g/dL   RDW 13.3 11.5 - 15.5 %   Platelets 183 150 - 400 K/uL   Neutrophils Relative % 61 43 - 77 %   Neutro Abs 3.9 1.7 - 7.7 K/uL   Lymphocytes Relative 31 12 - 46 %   Lymphs Abs 2.0 0.7 - 4.0 K/uL   Monocytes Relative 7 3 - 12 %   Monocytes Absolute 0.4 0.1 - 1.0 K/uL   Eosinophils Relative 0 0 - 5 %   Eosinophils Absolute 0.0 0.0 - 0.7 K/uL   Basophils Relative 1 0 - 1 %   Basophils Absolute 0.1 0.0 - 0.1 K/uL  Comprehensive metabolic panel     Status: Abnormal   Collection Time: 03/25/14 10:46 PM   Result Value Ref Range   Sodium 144 137 - 147 mEq/L   Potassium 3.7 3.7 - 5.3 mEq/L   Chloride 106 96 - 112 mEq/L   CO2 26 19 - 32 mEq/L   Glucose, Bld 102 (H) 70 - 99 mg/dL   BUN 20 6 - 23 mg/dL   Creatinine, Ser 1.35 0.50 - 1.35 mg/dL   Calcium 9.3 8.4 - 10.5 mg/dL   Total Protein 7.1 6.0 - 8.3 g/dL   Albumin 3.7 3.5 - 5.2 g/dL   AST 80 (H) 0 - 37 U/L   ALT 53 0 - 53 U/L   Alkaline Phosphatase 73 39 - 117 U/L   Total Bilirubin 1.8 (H) 0.3 - 1.2 mg/dL   GFR calc non Af Amer 53 (L) >90 mL/min   GFR calc Af Amer 61 (L) >90 mL/min    Comment: (NOTE) The eGFR has been calculated using the CKD EPI equation. This calculation has not been validated in all clinical situations. eGFR's persistently <90 mL/min signify possible Chronic Kidney Disease.    Anion gap 12 5 - 15  Lipase, blood     Status: Abnormal  Collection Time: 03/25/14 10:46 PM  Result Value Ref Range   Lipase >3000 (H) 11 - 59 U/L  Troponin I     Status: None   Collection Time: 03/25/14 10:46 PM  Result Value Ref Range   Troponin I <0.30 <0.30 ng/mL    Comment:        Due to the release kinetics of cTnI, a negative result within the first hours of the onset of symptoms does not rule out myocardial infarction with certainty. If myocardial infarction is still suspected, repeat the test at appropriate intervals.   Lactate dehydrogenase     Status: Abnormal   Collection Time: 03/26/14 12:21 AM  Result Value Ref Range   LDH 338 (H) 94 - 250 U/L  Urinalysis, Routine w reflex microscopic     Status: Abnormal   Collection Time: 03/26/14 12:47 AM  Result Value Ref Range   Color, Urine AMBER (A) YELLOW    Comment: BIOCHEMICALS MAY BE AFFECTED BY COLOR   APPearance CLEAR CLEAR   Specific Gravity, Urine 1.025 1.005 - 1.030   pH 6.0 5.0 - 8.0   Glucose, UA NEGATIVE NEGATIVE mg/dL   Hgb urine dipstick NEGATIVE NEGATIVE   Bilirubin Urine SMALL (A) NEGATIVE   Ketones, ur 15 (A) NEGATIVE mg/dL   Protein, ur NEGATIVE  NEGATIVE mg/dL   Urobilinogen, UA 1.0 0.0 - 1.0 mg/dL   Nitrite NEGATIVE NEGATIVE   Leukocytes, UA NEGATIVE NEGATIVE    Comment: MICROSCOPIC NOT DONE ON URINES WITH NEGATIVE PROTEIN, BLOOD, LEUKOCYTES, NITRITE, OR GLUCOSE <1000 mg/dL.    US Abdomen Limited  03/26/2014   CLINICAL DATA:  RIGHT upper quadrant pain.  History of pancreatitis.  EXAM: US ABDOMEN LIMITED - RIGHT UPPER QUADRANT  COMPARISON:  CT of the abdomen and pelvis July 19, 2013  FINDINGS: Gallbladder:  Multiple echogenic mobile gallstones measure up to at least 7 mm. No gallbladder wall thickening, pericholecystic fluid. No sonographic Murphy's sign was elicited.  Common bile duct:  Diameter: 5 mm  Liver:  No focal lesion identified. Within normal limits in parenchymal echogenicity. Hepatopetal portal vein.  Included view of the pancreas demonstrates ductal dilatation at 4 mm.  IMPRESSION: Cholelithiasis without sonographic findings of acute cholecystitis.  Mild pancreatic duct dilatation can be seen with acute or chronic pancreatitis.   Electronically Signed   By: Elon Alas   On: 03/26/2014 01:46    ROS Blood pressure 144/91, pulse 79, temperature 97.9 F (36.6 C), temperature source Oral, resp. rate 20, height 6' (1.829 m), weight 93.577 kg (206 lb 4.8 oz), SpO2 97 %. Physical Exam  Nursing note and vitals reviewed. Constitutional: He is oriented to person, place, and time. He appears well-developed and well-nourished.  HENT:  Head: Normocephalic and atraumatic.  Eyes: Conjunctivae and EOM are normal. Pupils are equal, round, and reactive to light.  Neck: Normal range of motion. Neck supple.  Cardiovascular: Normal rate, regular rhythm and normal heart sounds.   Respiratory: Effort normal and breath sounds normal.  GI: Soft. Normal appearance. Bowel sounds are absent. There is tenderness in the right upper quadrant, epigastric area, periumbilical area and left upper quadrant. There is positive Murphy's sign. There  is no rigidity, no rebound and no guarding. No hernia.  Musculoskeletal: Normal range of motion.  Neurological: He is alert and oriented to person, place, and time.  Skin: Skin is warm and dry.  Psychiatric: He has a normal mood and affect. His behavior is normal. Judgment and thought content normal.  Assessment/Plan: Acute gallstone pancreatitis. Symptoms improved, but labs pending No antibiotics for now. Will likely need surgery next 2-3 days as symptoms resolve and labs improve.Hulen Skains, JAY 03/26/2014, 6:50 AM

## 2014-03-26 NOTE — ED Notes (Signed)
Attempted to call report

## 2014-03-26 NOTE — Progress Notes (Addendum)
Patient ID: Mason [name]  male  ALP:[id number]    DOB: 1946/04/09    DOA: 03/25/2014  PCP: No PCP Per Patient  Brief history of present illness  Patient is a 67 year old male presented with abdominal pain, in the upper abdominal area,nausea and vomiting. Patient was found to have acute pancreatitis with lipase over 3000. Abdominal ultrasound showed multiple gallstones. Gen. Surgery consulted  Assessment/Plan: Principal Problem:   Acute pancreatitis/gallstone pancreatitis - continue nothing by mouth status, IV fluids and pain control - Gen. Surgery consulted, recommended cholecystectomy likely in 2-3 days once the symptoms improve - continue IV PPI and antiemetics  Active Problems:   Constipation - Currently stable    Cholelithiasis - Management as #1    History of occasional/social alcohol use - per patient's wife at the bedside and patient, he is not drinking, closely monitor   DVT Prophylaxis:  Code Status:  Family Communication:discussed in detail with patient's wife at the bedside  Disposition:  Consultants: Gen. surgery Procedures:  Ultrasound abdomen  Antibiotics:  none    Subjective: Still having some upper abdominal pain, significantly improved from admission  Objective: Weight change:   Intake/Output Summary (Last 24 hours) at 03/26/14 1149 Last data filed at 03/26/14 0303  Gross per 24 hour  Intake      0 ml  Output    200 ml  Net   -200 ml   Blood pressure 144/91, pulse 79, temperature 97.9 F (36.6 C), temperature source Oral, resp. rate 20, height 6' (1.829 m), weight 93.577 kg (206 lb 4.8 oz), SpO2 97 %.  Physical Exam: General: Alert and awake, oriented x3, not in any acute distress. HEENT: anicteric sclera, PERLA, EOMI CVS: S1-S2 clear, no murmur rubs or gallops Chest: clear to auscultation bilaterally, no wheezing, rales or rhonchi Abdomen: soft tender in epigastric area, right upper quadrant area, nondistended, normal bowel sounds    Extremities: no cyanosis, clubbing or edema noted bilaterally Neuro: Cranial nerves II-XII intact, no focal neurological deficits  Lab Results: Basic Metabolic Panel:  Recent Labs Lab 03/25/14 2246 03/26/14 0746  NA 144 145  K 3.7 4.2  CL 106 108  CO2 26 24  GLUCOSE 102* 100*  BUN 20 17  CREATININE 1.35 1.17  CALCIUM 9.3 8.6   Liver Function Tests:  Recent Labs Lab 03/25/14 2246 03/26/14 0746  AST 80* 84*  ALT 53 71*  ALKPHOS 73 65  BILITOT 1.8* 1.3*  PROT 7.1 6.7  ALBUMIN 3.7 3.6    Recent Labs Lab 03/25/14 2246 03/26/14 0746  LIPASE >3000* 1133*   No results for input(s): AMMONIA in the last 168 hours. CBC:  Recent Labs Lab 03/25/14 2246 03/26/14 0746  WBC 6.4 10.9*  NEUTROABS 3.9 10.2*  HGB 14.7 15.8  HCT 42.3 45.1  MCV 92.4 92.8  PLT 183 163   Cardiac Enzymes:  Recent Labs Lab 03/25/14 2246  TROPONINI <0.30   BNP: Invalid input(s): POCBNP CBG: No results for input(s): GLUCAP in the last 168 hours.   Micro Results: No results found for this or any previous visit (from the past 240 hour(s)).  Studies/Results: US Abdomen Limited  03/26/2014   CLINICAL DATA:  RIGHT upper quadrant pain.  History of pancreatitis.  EXAM: US ABDOMEN LIMITED - RIGHT UPPER QUADRANT  COMPARISON:  CT of the abdomen and pelvis July 19, 2013  FINDINGS: Gallbladder:  Multiple echogenic mobile gallstones measure up to at least 7 mm. No gallbladder wall thickening, pericholecystic fluid. No sonographic Murphy's sign was  elicited.  Common bile duct:  Diameter: 5 mm  Liver:  No focal lesion identified. Within normal limits in parenchymal echogenicity. Hepatopetal portal vein.  Included view of the pancreas demonstrates ductal dilatation at 4 mm.  IMPRESSION: Cholelithiasis without sonographic findings of acute cholecystitis.  Mild pancreatic duct dilatation can be seen with acute or chronic pancreatitis.   Electronically Signed   By: Elon Alas   On: 03/26/2014  01:46    Medications: Scheduled Meds: . fluticasone  1 puff Inhalation BID  . heparin  5,000 Units Subcutaneous 3 times per day      LOS: 1 day   Jakell Trusty M.D. [facility] 03/26/2014, 11:49 AM Pager: [phone]  If 7PM-7AM, please contact night-coverage [email or web address] Password [email or web address]   Addendum:  This chart is addended per patient's request which is approved. Patient drinks socially/occasionally, does not have history of alcohol abuse.   Maylynn Orzechowski M.D. [facility] 06/03/2014, 5:20 PM  Pager: [phone]

## 2014-03-26 NOTE — Progress Notes (Signed)
Utilization review completed. Author [place], RN, BSN. 

## 2014-03-26 NOTE — ED Provider Notes (Addendum)
CSN: [id number]     Arrival date & time 03/25/14  2228 History   First MD Initiated Contact with Patient 03/25/14 2344     Chief Complaint  Patient presents with  . Abdominal Pain     (Consider location/radiation/quality/duration/timing/severity/associated sxs/prior Treatment) HPI Comments: Pt has hx of pancreatitis in Feb. He had [place] and HIDA done at that time, that showed Gallbladder wall thickening/edema, measuring up to 4 mm. No gallstones or pericholecystic fluid. Pt also hada HIDA, with low EF. Pt didn't follow up with outpatient doctor, as he had no symptoms until today.  Patient is a 68 y.o. male presenting with abdominal pain. The history is provided by the patient.  Abdominal Pain Pain location:  Epigastric Pain quality: sharp   Pain radiates to:  Does not radiate Pain severity:  Severe Onset quality:  Sudden Duration:  2 hours Timing:  Constant Progression:  Waxing and waning Chronicity:  New Context: not alcohol use, not eating, not recent illness and not trauma   Associated symptoms: belching, nausea and vomiting   Associated symptoms: no chest pain, no cough, no dysuria and no shortness of breath     Past Medical History  Diagnosis Date  . Seasonal asthma   . Pancreatitis, acute 07/08/2013  . Gout attack 1990   Past Surgical History  Procedure Laterality Date  . Circumcision  ~ 1974  . Eye examination under anesthesia w/ retinal cryotherapy and retinal laser Right 2014    "for tear"   No family history on file. History  Substance Use Topics  . Smoking status: Never Smoker   . Smokeless tobacco: Never Used  . Alcohol Use: 1.8 oz/week    2 Cans of beer, 1 Shots of liquor per week     Comment: 07/08/2013    Review of Systems  Constitutional: Negative for activity change and appetite change.  Respiratory: Negative for cough and shortness of breath.   Cardiovascular: Negative for chest pain.  Gastrointestinal: Positive for nausea, vomiting and abdominal  pain.  Genitourinary: Negative for dysuria.  All other systems reviewed and are negative.     Allergies  Review of patient's allergies indicates no known allergies.  Home Medications   Prior to Admission medications   Medication Sig Start Date End Date Taking? Authorizing Provider  albuterol (PROVENTIL HFA;VENTOLIN HFA) 108 (90 BASE) MCG/ACT inhaler Inhale 1-2 puffs into the lungs every 6 (six) hours as needed for wheezing or shortness of breath.   Yes Historical Provider, MD  aspirin 500 MG tablet Take 1,000 mg by mouth every 6 (six) hours as needed for pain.   Yes Historical Provider, MD  fluticasone (FLOVENT HFA) 220 MCG/ACT inhaler Inhale 1 puff into the lungs 2 (two) times daily.   Yes Historical Provider, MD  ibuprofen (ADVIL,MOTRIN) 200 MG tablet Take 400 mg by mouth every 6 (six) hours as needed for moderate pain.   Yes Historical Provider, MD   BP 125/82 mmHg  Pulse 67  Temp(Src) 98.6 F (37 C)  Resp 18  Ht 6' (1.829 m)  Wt 205 lb (92.987 kg)  BMI 27.80 kg/m2  SpO2 94% Physical Exam  Constitutional: He is oriented to person, place, and time. He appears well-developed.  HENT:  Head: Normocephalic and atraumatic.  Eyes: Conjunctivae and EOM are normal. Pupils are equal, round, and reactive to light.  Neck: Normal range of motion. Neck supple.  Cardiovascular: Normal rate and regular rhythm.   Pulmonary/Chest: Effort normal and breath sounds normal.  Abdominal: Soft. Bowel  sounds are normal. He exhibits no distension. There is tenderness. There is guarding. There is no rebound.  Epigastric tenderness  Neurological: He is alert and oriented to person, place, and time.  Skin: Skin is warm.  Nursing note and vitals reviewed.   ED Course  Procedures (including critical care time) Labs Review Labs Reviewed  COMPREHENSIVE METABOLIC PANEL - Abnormal; Notable for the following:    Glucose, Bld 102 (*)    AST 80 (*)    Total Bilirubin 1.8 (*)    GFR calc non Af Amer 53  (*)    GFR calc Af Amer 61 (*)    All other components within normal limits  LIPASE, BLOOD - Abnormal; Notable for the following:    Lipase >3000 (*)    All other components within normal limits  URINALYSIS, ROUTINE W REFLEX MICROSCOPIC - Abnormal; Notable for the following:    Color, Urine AMBER (*)    Bilirubin Urine SMALL (*)    Ketones, ur 15 (*)    All other components within normal limits  CBC WITH DIFFERENTIAL  TROPONIN I  LACTATE DEHYDROGENASE    Imaging Review US Abdomen Limited  03/26/2014   CLINICAL DATA:  RIGHT upper quadrant pain.  History of pancreatitis.  EXAM: US ABDOMEN LIMITED - RIGHT UPPER QUADRANT  COMPARISON:  CT of the abdomen and pelvis July 19, 2013  FINDINGS: Gallbladder:  Multiple echogenic mobile gallstones measure up to at least 7 mm. No gallbladder wall thickening, pericholecystic fluid. No sonographic Murphy's sign was elicited.  Common bile duct:  Diameter: 5 mm  Liver:  No focal lesion identified. Within normal limits in parenchymal echogenicity. Hepatopetal portal vein.  Included view of the pancreas demonstrates ductal dilatation at 4 mm.  IMPRESSION: Cholelithiasis without sonographic findings of acute cholecystitis.  Mild pancreatic duct dilatation can be seen with acute or chronic pancreatitis.   Electronically Signed   By: Elon Alas   On: 03/26/2014 01:46     EKG Interpretation None      MDM   Final diagnoses:  RUQ abdominal pain  Cholelithiasis without cholecystitis  Pancreatitis, gallstone   Pt comes in with cc of abd pain. DDx includes: Pancreatitis Hepatobiliary pathology including cholecystitis Gastritis/PUD SBO ACS syndrome  Pt's lipase is > 3000, he has hx of pancreatitis. [place] ordered, and shows gall stones, but no cholecystitis. Will admit. Will Consult surgery, as there are gall stones.   Varney Biles, MD 03/26/14 9678  Varney Biles, MD 03/26/14 [phone]

## 2014-03-26 NOTE — Progress Notes (Signed)
Went to say hello to the patient.  He was just seen by Dr. Hulen Skains this am.  His pain is improving with pain medications.  I explained why he needs to be NPO.  We will await his improvement in lipase and pain prior to proceeding with lap chole.     Coralie Keens, PA-C General Surgery [facility] [phone] 03/26/2014

## 2014-03-27 DIAGNOSIS — K5900 Constipation, unspecified: Secondary | ICD-10-CM

## 2014-03-27 LAB — CBC
HCT: 42.3 % (ref 39.0–52.0)
Hemoglobin: 14.7 g/dL (ref 13.0–17.0)
MCH: 32.5 pg (ref 26.0–34.0)
MCHC: 34.8 g/dL (ref 30.0–36.0)
MCV: 93.4 fL (ref 78.0–100.0)
Platelets: 150 10*3/uL (ref 150–400)
RBC: 4.53 MIL/uL (ref 4.22–5.81)
RDW: 13.8 % (ref 11.5–15.5)
WBC: 10.5 10*3/uL (ref 4.0–10.5)

## 2014-03-27 LAB — COMPREHENSIVE METABOLIC PANEL
ALT: 47 U/L (ref 0–53)
AST: 47 U/L — ABNORMAL HIGH (ref 0–37)
Albumin: 3 g/dL — ABNORMAL LOW (ref 3.5–5.2)
Alkaline Phosphatase: 59 U/L (ref 39–117)
Anion gap: 16 — ABNORMAL HIGH (ref 5–15)
BUN: 20 mg/dL (ref 6–23)
CO2: 20 mEq/L (ref 19–32)
Calcium: 7.7 mg/dL — ABNORMAL LOW (ref 8.4–10.5)
Chloride: 108 mEq/L (ref 96–112)
Creatinine, Ser: 1.26 mg/dL (ref 0.50–1.35)
GFR calc Af Amer: 66 mL/min — ABNORMAL LOW (ref >90)
GFR calc non Af Amer: 57 mL/min — ABNORMAL LOW (ref >90)
Glucose, Bld: 74 mg/dL (ref 70–99)
Potassium: 3.8 mEq/L (ref 3.7–5.3)
Sodium: 144 mEq/L (ref 137–147)
Total Bilirubin: 1.6 mg/dL — ABNORMAL HIGH (ref 0.3–1.2)
Total Protein: 6.1 g/dL (ref 6.0–8.3)

## 2014-03-27 LAB — LIPASE, BLOOD: Lipase: 509 U/L — ABNORMAL HIGH (ref 11–59)

## 2014-03-27 MED ORDER — BISACODYL 10 MG RE SUPP
10.0000 mg | Freq: Once | RECTAL | Status: AC
  Administered 2014-03-27: 10 mg via RECTAL
  Filled 2014-03-27: qty 1

## 2014-03-27 MED ORDER — ONDANSETRON HCL 4 MG/2ML IJ SOLN: 4.0000 mg | INTRAMUSCULAR | Status: DC | PRN

## 2014-03-27 MED ORDER — PROMETHAZINE HCL 25 MG/ML IJ SOLN: 12.5000 mg | Freq: Four times a day (QID) | INTRAMUSCULAR | Status: DC | PRN

## 2014-03-27 MED ORDER — PIPERACILLIN-TAZOBACTAM 3.375 G IVPB
3.3750 g | Freq: Three times a day (TID) | INTRAVENOUS | Status: DC
  Administered 2014-03-27 – 2014-03-28 (×5): 3.375 g via INTRAVENOUS
  Filled 2014-03-27 (×7): qty 50

## 2014-03-27 NOTE — Progress Notes (Signed)
ANTIBIOTIC CONSULT NOTE - INITIAL  Pharmacy Consult for Zosyn Indication: Empiric abdominal coverage  No Known Allergies  Patient Measurements: Height: 6' (182.9 cm) Weight: 213 lb 1.6 oz (96.662 kg) IBW/kg (Calculated) : 77.6  Vital Signs: Temp: 101.6 F (38.7 C) (11/06 0524) Temp Source: Oral (11/06 0524) BP: 133/83 mmHg (11/06 0524) Pulse Rate: 102 (11/06 0524) Intake/Output from previous day: 11/05 0701 - 11/06 0700 In: 1460.4 [I.V.:1460.4] Out: -  Intake/Output from this shift:    Labs:  Recent Labs  03/25/14 2246 03/26/14 0746 03/27/14 0500  WBC 6.4 10.9* 10.5  HGB 14.7 15.8 14.7  PLT 183 163 150  CREATININE 1.35 1.17 1.26   Estimated Creatinine Clearance: 68.6 mL/min (by C-G formula based on Cr of 1.26). No results for input(s): VANCOTROUGH, VANCOPEAK, VANCORANDOM, GENTTROUGH, GENTPEAK, GENTRANDOM, TOBRATROUGH, TOBRAPEAK, TOBRARND, AMIKACINPEAK, AMIKACINTROU, AMIKACIN in the last 72 hours.   Microbiology: No results found for this or any previous visit (from the past 720 hour(s)).  Medical History: Past Medical History  Diagnosis Date  . Seasonal asthma   . Pancreatitis, acute 07/08/2013  . Gout attack 1990    Assessment: 66 YOM who presented to the [facility] on 11/5 with abdominal pain and was found to have acute gallstone pancreatitis. Surgery on board - planning for surgery in 2-3 days and recommended holding off on antibiotics for now. The patient spiked a fever on 11/6 AM and pharmacy has been consulted to start Zosyn for empiric coverage. SCr 1.26, CrCl~60-70 ml/min.   Goal of Therapy:  Proper antibiotics for infection/cultures adjusted for renal/hepatic function   Plan:  1. Start Zosyn 3.375g IV every 8 hours (infused over 4 hours) 2. No dose adjustments are expected so pharmacy will sign off and monitor peripherally 3. Will continue to follow renal function, culture results, LOT, and antibiotic de-escalation plans   Alycia Rossetti, PharmD,  BCPS Clinical Pharmacist Pager: [phone] 03/27/2014 7:30 AM

## 2014-03-27 NOTE — Progress Notes (Signed)
Pt complained of continuous belching that caused abdominal discomfort. NP notified. Medication given. Will continue to monitor patient.

## 2014-03-27 NOTE — Progress Notes (Signed)
Pt temp 101.6 PRN med given. MD notified.

## 2014-03-27 NOTE — Care Management Note (Signed)
CARE MANAGEMENT NOTE 03/27/2014  Patient:  Baskin,Sascha   Account Number:  [id number]  Date Initiated:  03/27/2014  Documentation initiated by:  ,  Subjective/Objective Assessment:   CM following for progression and d/c planning.     Action/Plan:   03/27/14 Met with pt and IM given, pt plans to d/c back to home, post op.   Anticipated DC Date:  03/29/2014   Anticipated DC Plan:  HOME/SELF CARE         Choice offered to / List presented to:             Status of service:  In process, will continue to follow Medicare Important Message given?  YES (If response is "NO", the following Medicare IM given date fields will be blank) Date Medicare IM given:  03/27/2014 Medicare IM given by:  , Date Additional Medicare IM given:   Additional Medicare IM given by:    Discharge Disposition:    Per UR Regulation:    If discussed at Long Length of Stay Meetings, dates discussed:    Comments:    

## 2014-03-27 NOTE — Progress Notes (Addendum)
Patient ID: Mason [name]  male  QIH:[id number]    DOB: 01-17-1946    DOA: 03/25/2014  PCP: No PCP Per Patient  Brief history of present illness  Patient is a 68 year old male presented with abdominal pain, in the upper abdominal area,nausea and vomiting. Patient was found to have acute pancreatitis with lipase over 3000. Abdominal ultrasound showed multiple gallstones. Gen. Surgery consulted  Assessment/Plan: Principal Problem:   Acute pancreatitis/gallstone pancreatitis: lipase improving, spiking fevers this morning - continue NPO, IV fluids and pain control - Gen. Surgery following, continue IV PPI and antiemetics - placed on IV Zosyn, follow blood cultures  Active Problems:   Constipation - placed on Dulcolax suppository    Cholelithiasis - Management as #1    History of occasional/social alcohol use - per patient's wife at the bedside and patient, he is not drinking, closely monitor   DVT Prophylaxis:heparin subcutaneous  Code Status: FC  Family Communication: discussed in detail with patient's wife at the bedside  Disposition:  Consultants: Gen. Surgery  Procedures:  Ultrasound abdomen  Antibiotics:  none    Subjective: Patient seen and examined, spiking low-grade fevers, pain improving, no nausea or vomiting  Objective: Weight change: 3.674 kg (8 lb 1.6 oz)  Intake/Output Summary (Last 24 hours) at 03/27/14 1045 Last data filed at 03/27/14 0900  Gross per 24 hour  Intake 1460.42 ml  Output      0 ml  Net 1460.42 ml   Blood pressure 134/79, pulse 100, temperature 99.3 F (37.4 C), temperature source Oral, resp. rate 17, height 6' (1.829 m), weight 96.662 kg (213 lb 1.6 oz), SpO2 94 %.  Physical Exam: General: Alert and awake, oriented x3, not in any acute distress. CVS: S1-S2 clear, no murmur rubs or gallops Chest: clear to auscultation bilaterally, no wheezing, rales or rhonchi Abdomen: soft tender in epigastric area, right upper quadrant area  (per patient pain improving), nondistended, normal bowel sounds  Extremities: no cyanosis, clubbing or edema noted bilaterally   Lab Results: Basic Metabolic Panel:  Recent Labs Lab 03/26/14 0746 03/27/14 0500  NA 145 144  K 4.2 3.8  CL 108 108  CO2 24 20  GLUCOSE 100* 74  BUN 17 20  CREATININE 1.17 1.26  CALCIUM 8.6 7.7*   Liver Function Tests:  Recent Labs Lab 03/26/14 0746 03/27/14 0500  AST 84* 47*  ALT 71* 47  ALKPHOS 65 59  BILITOT 1.3* 1.6*  PROT 6.7 6.1  ALBUMIN 3.6 3.0*    Recent Labs Lab 03/26/14 0746 03/27/14 0500  LIPASE 1133* 509*   No results for input(s): AMMONIA in the last 168 hours. CBC:  Recent Labs Lab 03/26/14 0746 03/27/14 0500  WBC 10.9* 10.5  NEUTROABS 10.2*  --   HGB 15.8 14.7  HCT 45.1 42.3  MCV 92.8 93.4  PLT 163 150   Cardiac Enzymes:  Recent Labs Lab 03/25/14 2246  TROPONINI <0.30   BNP: Invalid input(s): POCBNP CBG: No results for input(s): GLUCAP in the last 168 hours.   Micro Results: No results found for this or any previous visit (from the past 240 hour(s)).  Studies/Results: US Abdomen Limited  03/26/2014   CLINICAL DATA:  RIGHT upper quadrant pain.  History of pancreatitis.  EXAM: US ABDOMEN LIMITED - RIGHT UPPER QUADRANT  COMPARISON:  CT of the abdomen and pelvis July 19, 2013  FINDINGS: Gallbladder:  Multiple echogenic mobile gallstones measure up to at least 7 mm. No gallbladder wall thickening, pericholecystic fluid. No sonographic Murphy's sign  was elicited.  Common bile duct:  Diameter: 5 mm  Liver:  No focal lesion identified. Within normal limits in parenchymal echogenicity. Hepatopetal portal vein.  Included view of the pancreas demonstrates ductal dilatation at 4 mm.  IMPRESSION: Cholelithiasis without sonographic findings of acute cholecystitis.  Mild pancreatic duct dilatation can be seen with acute or chronic pancreatitis.   Electronically Signed   By: Elon Alas   On: 03/26/2014 01:46     Medications: Scheduled Meds: . bisacodyl  10 mg Rectal Once  . fluticasone  1 puff Inhalation BID  . heparin  5,000 Units Subcutaneous 3 times per day  . piperacillin-tazobactam (ZOSYN)  IV  3.375 g Intravenous Q8H      LOS: 2 days   Stella Bortle M.D. [facility] 03/27/2014, 10:45 AM Pager: [phone]  If 7PM-7AM, please contact night-coverage [email or web address] Password [email or web address]  Addendum: This chart is addended per patient's request which is approved, patient drinks only occasionally/socially and does not have history of alcohol abuse.   Kyuss Hale M.D. [facility] 06/03/2014, 5:22 PM  Pager: [phone]

## 2014-03-27 NOTE — Progress Notes (Signed)
[facility] Progress Note     Subjective: Pt doing better, no N/v, no flatus or BM, wants suppository.  Ambulating oob.  Pain improved but still present in epigastrium.    Objective: Vital signs in last 24 hours: Temp:  [97.4 F (36.3 C)-101.6 F (38.7 C)] 101.6 F (38.7 C) (11/06 0524) Pulse Rate:  [81-102] 102 (11/06 0524) Resp:  [17-20] 17 (11/06 0524) BP: (133-178)/(80-98) 133/83 mmHg (11/06 0524) SpO2:  [93 %-99 %] 93 % (11/06 0524) Weight:  [213 lb 1.6 oz (96.662 kg)] 213 lb 1.6 oz (96.662 kg) (11/05 2045) Last BM Date: 03/25/14  Intake/Output from previous day: 11/05 0701 - 11/06 0700 In: 1460.4 [I.V.:1460.4] Out: -  Intake/Output this shift:    PE: Gen:  Alert, NAD, pleasant Abd: Soft, mild tenderness in epigastrium, ND, +BS, no HSM   Lab Results:   Recent Labs  03/26/14 0746 03/27/14 0500  WBC 10.9* 10.5  HGB 15.8 14.7  HCT 45.1 42.3  PLT 163 150   BMET  Recent Labs  03/26/14 0746 03/27/14 0500  NA 145 144  K 4.2 3.8  CL 108 108  CO2 24 20  GLUCOSE 100* 74  BUN 17 20  CREATININE 1.17 1.26  CALCIUM 8.6 7.7*   PT/INR  Recent Labs  03/26/14 0746  LABPROT 14.3  INR 1.10   CMP     Component Value Date/Time   NA 144 03/27/2014 0500   K 3.8 03/27/2014 0500   CL 108 03/27/2014 0500   CO2 20 03/27/2014 0500   GLUCOSE 74 03/27/2014 0500   BUN 20 03/27/2014 0500   CREATININE 1.26 03/27/2014 0500   CALCIUM 7.7* 03/27/2014 0500   PROT 6.1 03/27/2014 0500   ALBUMIN 3.0* 03/27/2014 0500   AST 47* 03/27/2014 0500   ALT 47 03/27/2014 0500   ALKPHOS 59 03/27/2014 0500   BILITOT 1.6* 03/27/2014 0500   GFRNONAA 57* 03/27/2014 0500   GFRAA 66* 03/27/2014 0500   Lipase     Component Value Date/Time   LIPASE 509* 03/27/2014 0500       Studies/Results: US Abdomen Limited  03/26/2014   CLINICAL DATA:  RIGHT upper quadrant pain.  History of pancreatitis.  EXAM: US ABDOMEN LIMITED - RIGHT UPPER QUADRANT  COMPARISON:  CT of  the abdomen and pelvis July 19, 2013  FINDINGS: Gallbladder:  Multiple echogenic mobile gallstones measure up to at least 7 mm. No gallbladder wall thickening, pericholecystic fluid. No sonographic Murphy's sign was elicited.  Common bile duct:  Diameter: 5 mm  Liver:  No focal lesion identified. Within normal limits in parenchymal echogenicity. Hepatopetal portal vein.  Included view of the pancreas demonstrates ductal dilatation at 4 mm.  IMPRESSION: Cholelithiasis without sonographic findings of acute cholecystitis.  Mild pancreatic duct dilatation can be seen with acute or chronic pancreatitis.   Electronically Signed   By: Elon Alas   On: 03/26/2014 01:46    Anti-infectives: Anti-infectives    Start     Dose/Rate Route Frequency Ordered Stop   03/27/14 0800  piperacillin-tazobactam (ZOSYN) IVPB 3.375 g     3.375 g12.5 mL/hr over 240 Minutes Intravenous Every 8 hours 03/27/14 0727         Assessment/Plan Acute gallstone pancreatitis Elevated lipase Rising bilirubin - 1.6 Leukocytosis - resolved Constipation  Plan: 1.  Symptoms improving, lipase still >500, bilirubin is up to 1.6 from 1.3, if it continues to rise may need GI consult 2.  NPO, IVF, pain control, antiemetics, no antibiotics for  now 3.  Will likely need surgery next 1-2 days as symptoms resolve and labs improve. 4.  Encouraged ambulating and IS 5.  SCD's and heparin 6.  Repeat labs in am 7.  Dulcolax for constipation    LOS: 2 days    Coralie Keens 03/27/2014, 8:44 AM Pager: [phone]

## 2014-03-28 ENCOUNTER — Encounter (HOSPITAL_COMMUNITY): Payer: Self-pay | Admitting: Radiology

## 2014-03-28 ENCOUNTER — Inpatient Hospital Stay (HOSPITAL_COMMUNITY): Payer: Medicare Other

## 2014-03-28 LAB — COMPREHENSIVE METABOLIC PANEL
ALT: 33 U/L (ref 0–53)
AST: 37 U/L (ref 0–37)
Albumin: 2.5 g/dL — ABNORMAL LOW (ref 3.5–5.2)
Alkaline Phosphatase: 62 U/L (ref 39–117)
Anion gap: 16 — ABNORMAL HIGH (ref 5–15)
BUN: 19 mg/dL (ref 6–23)
CO2: 17 mEq/L — ABNORMAL LOW (ref 19–32)
Calcium: 7.8 mg/dL — ABNORMAL LOW (ref 8.4–10.5)
Chloride: 109 mEq/L (ref 96–112)
Creatinine, Ser: 1.31 mg/dL (ref 0.50–1.35)
GFR calc Af Amer: 63 mL/min — ABNORMAL LOW (ref >90)
GFR calc non Af Amer: 55 mL/min — ABNORMAL LOW (ref >90)
Glucose, Bld: 62 mg/dL — ABNORMAL LOW (ref 70–99)
Potassium: 3.7 mEq/L (ref 3.7–5.3)
Sodium: 142 mEq/L (ref 137–147)
Total Bilirubin: 1.9 mg/dL — ABNORMAL HIGH (ref 0.3–1.2)
Total Protein: 6 g/dL (ref 6.0–8.3)

## 2014-03-28 LAB — CBC
HCT: 38.2 % — ABNORMAL LOW (ref 39.0–52.0)
Hemoglobin: 13.2 g/dL (ref 13.0–17.0)
MCH: 32.4 pg (ref 26.0–34.0)
MCHC: 34.6 g/dL (ref 30.0–36.0)
MCV: 93.6 fL (ref 78.0–100.0)
Platelets: 128 10*3/uL — ABNORMAL LOW (ref 150–400)
RBC: 4.08 MIL/uL — ABNORMAL LOW (ref 4.22–5.81)
RDW: 13.9 % (ref 11.5–15.5)
WBC: 11.9 10*3/uL — ABNORMAL HIGH (ref 4.0–10.5)

## 2014-03-28 LAB — LIPID PANEL
Cholesterol: 91 mg/dL (ref 0–200)
HDL: 30 mg/dL — ABNORMAL LOW (ref >39)
LDL Cholesterol: 46 mg/dL (ref 0–99)
Total CHOL/HDL Ratio: 3 RATIO
Triglycerides: 74 mg/dL (ref <150)
VLDL: 15 mg/dL (ref 0–40)

## 2014-03-28 LAB — LIPASE, BLOOD: Lipase: 77 U/L — ABNORMAL HIGH (ref 11–59)

## 2014-03-28 MED ORDER — IOHEXOL 300 MG/ML  SOLN
25.0000 mL | INTRAMUSCULAR | Status: AC
  Administered 2014-03-28 (×2): 25 mL via ORAL

## 2014-03-28 MED ORDER — SODIUM CHLORIDE 0.9 % IV SOLN
500.0000 mg | Freq: Three times a day (TID) | INTRAVENOUS | Status: DC
  Administered 2014-03-28 – 2014-03-29 (×3): 500 mg via INTRAVENOUS
  Filled 2014-03-28 (×4): qty 500

## 2014-03-28 MED ORDER — IOHEXOL 300 MG/ML  SOLN
100.0000 mL | Freq: Once | INTRAMUSCULAR | Status: AC | PRN
  Administered 2014-03-28: 100 mL via INTRAVENOUS

## 2014-03-28 NOTE — Progress Notes (Signed)
ANTIBIOTIC CONSULT NOTE - INITIAL  Pharmacy Consult for imipenem Indication: intra-abdominal infection  No Known Allergies  Patient Measurements: Height: 6' (182.9 cm) Weight: 204 lb 9.6 oz (92.806 kg) IBW/kg (Calculated) : 77.6 Adjusted Body Weight:   Vital Signs: Temp: 102.9 F (39.4 C) (11/07 1616) Temp Source: Oral (11/07 1616) BP: 129/71 mmHg (11/07 1004) Pulse Rate: 96 (11/07 1004) Intake/Output from previous day: 11/06 0701 - 11/07 0700 In: 1210.4 [I.V.:1160.4; IV Piggyback:50] Out: -  Intake/Output from this shift: Total I/O In: 500 [I.V.:500] Out: -   Labs:  Recent Labs  03/26/14 0746 03/27/14 0500 03/28/14 0510  WBC 10.9* 10.5 11.9*  HGB 15.8 14.7 13.2  PLT 163 150 128*  CREATININE 1.17 1.26 1.31   Estimated Creatinine Clearance: 60.1 mL/min (by C-G formula based on Cr of 1.31). No results for input(s): VANCOTROUGH, VANCOPEAK, VANCORANDOM, GENTTROUGH, GENTPEAK, GENTRANDOM, TOBRATROUGH, TOBRAPEAK, TOBRARND, AMIKACINPEAK, AMIKACINTROU, AMIKACIN in the last 72 hours.   Microbiology: No results found for this or any previous visit (from the past 720 hour(s)).  Medical History: Past Medical History  Diagnosis Date  . Seasonal asthma   . Pancreatitis, acute 07/08/2013  . Gout attack 1990    Medications:  Scheduled:  . fluticasone  1 puff Inhalation BID  . heparin  5,000 Units Subcutaneous 3 times per day   Infusions:  . sodium chloride 125 mL/hr at 03/28/14 1620   Assessment: 68 yo male with intra-abdominal infection will be switched from zosyn to imipenem.  CrCl ~60.  Goal of Therapy:  Resolution of infection.  Plan:  - imipenem 500 mg iv q8h. Pharmacy will sign off  Tranesha Lessner, Tsz-Yin 03/28/2014,4:23 PM

## 2014-03-28 NOTE — Progress Notes (Signed)
Temp 102.9, tylenol given per PRN order. MD notified.  Will continue to monitor.

## 2014-03-28 NOTE — Progress Notes (Addendum)
Patient ID: Mason [name]  male  VPX:[id number]    DOB: 05-01-1946    DOA: 03/25/2014  PCP: No PCP Per Patient  Brief history of present illness  Patient is a 68 year old male presented with abdominal pain, in the upper abdominal area,nausea and vomiting. Patient was found to have acute pancreatitis with lipase over 3000. Abdominal ultrasound showed multiple gallstones. Gen. Surgery consulted  Assessment/Plan: Principal Problem:   Acute pancreatitis/gallstone pancreatitis: lipase improving, low grade temp overnight - continue NPO, IV fluids and pain control - Gen. Surgery following, continue IV PPI and antiemetics - placed on IV Zosyn, blood cultures negative so far - Bilirubin slightly trending up, may need GI consult - lipase down to 77  Active Problems:   Constipation - placed on Dulcolax suppository    Cholelithiasis - Management as #1    History of occasion/social alcohol use - per patient's wife at the bedside and patient, he is not drinking, closely monitor   DVT Prophylaxis:heparin subcutaneous  Code Status: FC  Family Communication: discussed in detail with patient's wife at the bedside  Disposition:  Consultants: Gen. Surgery  Procedures:  Ultrasound abdomen  Antibiotics:  none    Subjective: Patient seen and examined, feels better this morning,still spiking low-grade fevers  Objective: Weight change: -3.856 kg (-8 lb 8 oz)  Intake/Output Summary (Last 24 hours) at 03/28/14 1019 Last data filed at 03/28/14 0417  Gross per 24 hour  Intake 1210.42 ml  Output      0 ml  Net 1210.42 ml   Blood pressure 129/71, pulse 96, temperature 100.2 F (37.9 C), temperature source Oral, resp. rate 16, height 6' (1.829 m), weight 92.806 kg (204 lb 9.6 oz), SpO2 97 %.  Physical Exam: General: Alert and awake, oriented x3, not in any acute distress. CVS: S1-S2 clear, no murmur rubs or gallops Chest: CTAB Abdomen: soft mildly tender in epigastric, RUQ area,  nondistended, normal bowel sounds  Extremities: no cyanosis, clubbing or edema noted bilaterally   Lab Results: Basic Metabolic Panel:  Recent Labs Lab 03/27/14 0500 03/28/14 0510  NA 144 142  K 3.8 3.7  CL 108 109  CO2 20 17*  GLUCOSE 74 62*  BUN 20 19  CREATININE 1.26 1.31  CALCIUM 7.7* 7.8*   Liver Function Tests:  Recent Labs Lab 03/27/14 0500 03/28/14 0510  AST 47* 37  ALT 47 33  ALKPHOS 59 62  BILITOT 1.6* 1.9*  PROT 6.1 6.0  ALBUMIN 3.0* 2.5*    Recent Labs Lab 03/27/14 0500 03/28/14 0510  LIPASE 509* 77*   No results for input(s): AMMONIA in the last 168 hours. CBC:  Recent Labs Lab 03/26/14 0746 03/27/14 0500 03/28/14 0510  WBC 10.9* 10.5 11.9*  NEUTROABS 10.2*  --   --   HGB 15.8 14.7 13.2  HCT 45.1 42.3 38.2*  MCV 92.8 93.4 93.6  PLT 163 150 128*   Cardiac Enzymes:  Recent Labs Lab 03/25/14 2246  TROPONINI <0.30   BNP: Invalid input(s): POCBNP CBG: No results for input(s): GLUCAP in the last 168 hours.   Micro Results: No results found for this or any previous visit (from the past 240 hour(s)).  Studies/Results: US Abdomen Limited  03/26/2014   CLINICAL DATA:  RIGHT upper quadrant pain.  History of pancreatitis.  EXAM: US ABDOMEN LIMITED - RIGHT UPPER QUADRANT  COMPARISON:  CT of the abdomen and pelvis July 19, 2013  FINDINGS: Gallbladder:  Multiple echogenic mobile gallstones measure up to at least 7 mm.  No gallbladder wall thickening, pericholecystic fluid. No sonographic Murphy's sign was elicited.  Common bile duct:  Diameter: 5 mm  Liver:  No focal lesion identified. Within normal limits in parenchymal echogenicity. Hepatopetal portal vein.  Included view of the pancreas demonstrates ductal dilatation at 4 mm.  IMPRESSION: Cholelithiasis without sonographic findings of acute cholecystitis.  Mild pancreatic duct dilatation can be seen with acute or chronic pancreatitis.   Electronically Signed   By: Elon Alas   On:  03/26/2014 01:46    Medications: Scheduled Meds: . fluticasone  1 puff Inhalation BID  . heparin  5,000 Units Subcutaneous 3 times per day  . piperacillin-tazobactam (ZOSYN)  IV  3.375 g Intravenous Q8H      LOS: 3 days   Gustaf Mccarter M.D. [facility] 03/28/2014, 10:19 AM Pager: [phone]  If 7PM-7AM, please contact night-coverage [email or web address] Password [email or web address]   Addendum This chart is addended per patient's request which is approved, patient drinks only occasionally/socially and does not have history of alcohol abuse   Harwood Nall M.D. [facility] 06/03/2014, 5:22 PM  Pager: [phone]

## 2014-03-28 NOTE — Plan of Care (Signed)
Problem: Phase I Progression Outcomes Goal: Voiding-avoid urinary catheter unless indicated Outcome: Completed/Met Date Met:  03/28/14     

## 2014-03-28 NOTE — Plan of Care (Signed)
Problem: Phase I Progression Outcomes Goal: Pain controlled with appropriate interventions Outcome: Completed/Met Date Met:  03/28/14 Goal: OOB as tolerated unless otherwise ordered Outcome: Completed/Met Date Met:  03/28/14

## 2014-03-28 NOTE — Progress Notes (Signed)
  Subjective: Feels a little better. Still has some central tenderness  Objective: Vital signs in last 24 hours: Temp:  [99 F (37.2 C)-102.5 F (39.2 C)] 100.2 F (37.9 C) (11/07 0818) Pulse Rate:  [96-105] 96 (11/07 1004) Resp:  [16-20] 16 (11/07 1004) BP: (109-129)/(60-76) 129/71 mmHg (11/07 1004) SpO2:  [92 %-97 %] 97 % (11/07 1004) Weight:  [204 lb 9.6 oz (92.806 kg)] 204 lb 9.6 oz (92.806 kg) (11/06 2004) Last BM Date: 03/27/14  Intake/Output from previous day: 11/06 0701 - 11/07 0700 In: 1210.4 [I.V.:1160.4; IV Piggyback:50] Out: -  Intake/Output this shift:    Resp: clear to auscultation bilaterally Cardio: regular rate and rhythm GI: soft, mild central tenderness  Lab Results:   Recent Labs  03/27/14 0500 03/28/14 0510  WBC 10.5 11.9*  HGB 14.7 13.2  HCT 42.3 38.2*  PLT 150 128*   BMET  Recent Labs  03/27/14 0500 03/28/14 0510  NA 144 142  K 3.8 3.7  CL 108 109  CO2 20 17*  GLUCOSE 74 62*  BUN 20 19  CREATININE 1.26 1.31  CALCIUM 7.7* 7.8*   PT/INR  Recent Labs  03/26/14 0746  LABPROT 14.3  INR 1.10   ABG No results for input(s): PHART, HCO3 in the last 72 hours.  Invalid input(s): PCO2, PO2  Studies/Results: No results found.  Anti-infectives: Anti-infectives    Start     Dose/Rate Route Frequency Ordered Stop   03/27/14 0800  piperacillin-tazobactam (ZOSYN) IVPB 3.375 g     3.375 g12.5 mL/hr over 240 Minutes Intravenous Every 8 hours 03/27/14 0727        Assessment/Plan: s/p * No surgery found * continue bowel rest  Lipase normalizing quickly Will be ready for lap chole today or tomorrow  LOS: 3 days    TOTH III,Inger Wiest S 03/28/2014

## 2014-03-29 ENCOUNTER — Encounter (HOSPITAL_COMMUNITY)
Admission: EM | Disposition: A | Discharge status: Home / Self Care | Payer: Self-pay | Source: Home / Self Care | Attending: Internal Medicine

## 2014-03-29 ENCOUNTER — Inpatient Hospital Stay (HOSPITAL_COMMUNITY): Payer: Medicare Other | Admitting: Anesthesiology

## 2014-03-29 ENCOUNTER — Encounter (HOSPITAL_COMMUNITY): Payer: Self-pay | Admitting: Anesthesiology

## 2014-03-29 ENCOUNTER — Inpatient Hospital Stay (HOSPITAL_COMMUNITY): Payer: Medicare Other

## 2014-03-29 HISTORY — PX: CHOLECYSTECTOMY: SHX55

## 2014-03-29 LAB — CBC
HCT: 32.9 % — ABNORMAL LOW (ref 39.0–52.0)
Hemoglobin: 11 g/dL — ABNORMAL LOW (ref 13.0–17.0)
MCH: 30.8 pg (ref 26.0–34.0)
MCHC: 33.4 g/dL (ref 30.0–36.0)
MCV: 92.2 fL (ref 78.0–100.0)
Platelets: 151 10*3/uL (ref 150–400)
RBC: 3.57 MIL/uL — ABNORMAL LOW (ref 4.22–5.81)
RDW: 13.7 % (ref 11.5–15.5)
WBC: 9.9 10*3/uL (ref 4.0–10.5)

## 2014-03-29 LAB — COMPREHENSIVE METABOLIC PANEL
ALT: 30 U/L (ref 0–53)
AST: 47 U/L — ABNORMAL HIGH (ref 0–37)
Albumin: 2.4 g/dL — ABNORMAL LOW (ref 3.5–5.2)
Alkaline Phosphatase: 59 U/L (ref 39–117)
Anion gap: 15 (ref 5–15)
BUN: 18 mg/dL (ref 6–23)
CO2: 18 mEq/L — ABNORMAL LOW (ref 19–32)
Calcium: 7.5 mg/dL — ABNORMAL LOW (ref 8.4–10.5)
Chloride: 112 mEq/L (ref 96–112)
Creatinine, Ser: 1.05 mg/dL (ref 0.50–1.35)
GFR calc Af Amer: 83 mL/min — ABNORMAL LOW (ref >90)
GFR calc non Af Amer: 71 mL/min — ABNORMAL LOW (ref >90)
Glucose, Bld: 70 mg/dL (ref 70–99)
Potassium: 3.3 mEq/L — ABNORMAL LOW (ref 3.7–5.3)
Sodium: 145 mEq/L (ref 137–147)
Total Bilirubin: 1.8 mg/dL — ABNORMAL HIGH (ref 0.3–1.2)
Total Protein: 5.7 g/dL — ABNORMAL LOW (ref 6.0–8.3)

## 2014-03-29 LAB — SURGICAL PCR SCREEN
MRSA, PCR: NEGATIVE
Staphylococcus aureus: NEGATIVE

## 2014-03-29 LAB — LIPASE, BLOOD: Lipase: 21 U/L (ref 11–59)

## 2014-03-29 SURGERY — LAPAROSCOPIC CHOLECYSTECTOMY WITH INTRAOPERATIVE CHOLANGIOGRAM
Anesthesia: General

## 2014-03-29 MED ORDER — PROPOFOL 10 MG/ML IV BOLUS
INTRAVENOUS | Status: AC
  Filled 2014-03-29: qty 20

## 2014-03-29 MED ORDER — HYDROCODONE-ACETAMINOPHEN 5-325 MG PO TABS
1.0000 | ORAL_TABLET | ORAL | Status: DC | PRN
  Administered 2014-04-01: 1 via ORAL
  Administered 2014-04-02: 2 via ORAL
  Filled 2014-03-29: qty 2
  Filled 2014-03-29: qty 1

## 2014-03-29 MED ORDER — HYDROMORPHONE HCL 1 MG/ML IJ SOLN: 1.0000 mg | INTRAMUSCULAR | Status: DC | PRN

## 2014-03-29 MED ORDER — SODIUM CHLORIDE 0.9 % IV SOLN
500.0000 mg | Freq: Four times a day (QID) | INTRAVENOUS | Status: DC
  Administered 2014-03-29 – 2014-03-31 (×8): 500 mg via INTRAVENOUS
  Filled 2014-03-29 (×10): qty 500

## 2014-03-29 MED ORDER — GLYCOPYRROLATE 0.2 MG/ML IJ SOLN
INTRAMUSCULAR | Status: DC | PRN
  Administered 2014-03-29: .6 mg via INTRAVENOUS

## 2014-03-29 MED ORDER — FENTANYL CITRATE 0.05 MG/ML IJ SOLN
INTRAMUSCULAR | Status: DC | PRN
  Administered 2014-03-29: 100 ug via INTRAVENOUS
  Administered 2014-03-29: 5 ug via INTRAVENOUS

## 2014-03-29 MED ORDER — FENTANYL CITRATE 0.05 MG/ML IJ SOLN: 25.0000 ug | INTRAMUSCULAR | Status: DC | PRN

## 2014-03-29 MED ORDER — PROPOFOL 10 MG/ML IV BOLUS
INTRAVENOUS | Status: DC | PRN
  Administered 2014-03-29: 200 mg via INTRAVENOUS

## 2014-03-29 MED ORDER — ROCURONIUM BROMIDE 50 MG/5ML IV SOLN
INTRAVENOUS | Status: AC
  Filled 2014-03-29: qty 1

## 2014-03-29 MED ORDER — BUPIVACAINE-EPINEPHRINE (PF) 0.25% -1:200000 IJ SOLN
INTRAMUSCULAR | Status: AC
  Filled 2014-03-29: qty 30

## 2014-03-29 MED ORDER — LIDOCAINE HCL (CARDIAC) 20 MG/ML IV SOLN
INTRAVENOUS | Status: DC | PRN
  Administered 2014-03-29: 80 mg via INTRAVENOUS

## 2014-03-29 MED ORDER — ROCURONIUM BROMIDE 100 MG/10ML IV SOLN
INTRAVENOUS | Status: DC | PRN
  Administered 2014-03-29: 40 mg via INTRAVENOUS

## 2014-03-29 MED ORDER — NEOSTIGMINE METHYLSULFATE 10 MG/10ML IV SOLN
INTRAVENOUS | Status: DC | PRN
  Administered 2014-03-29: 4 mg via INTRAVENOUS

## 2014-03-29 MED ORDER — ONDANSETRON HCL 4 MG/2ML IJ SOLN
INTRAMUSCULAR | Status: DC | PRN
  Administered 2014-03-29: 4 mg via INTRAVENOUS

## 2014-03-29 MED ORDER — POTASSIUM CHLORIDE CRYS ER 20 MEQ PO TBCR
40.0000 meq | EXTENDED_RELEASE_TABLET | Freq: Once | ORAL | Status: AC
  Administered 2014-03-29: 40 meq via ORAL
  Filled 2014-03-29: qty 2

## 2014-03-29 MED ORDER — LACTATED RINGERS IV SOLN
INTRAVENOUS | Status: DC | PRN
  Administered 2014-03-29 (×2): via INTRAVENOUS

## 2014-03-29 MED ORDER — SODIUM CHLORIDE 0.9 % IR SOLN
Status: DC | PRN
  Administered 2014-03-29: 1000 mL

## 2014-03-29 MED ORDER — SUCCINYLCHOLINE CHLORIDE 20 MG/ML IJ SOLN
INTRAMUSCULAR | Status: DC | PRN
  Administered 2014-03-29: 120 mg via INTRAVENOUS

## 2014-03-29 MED ORDER — BUPIVACAINE-EPINEPHRINE 0.25% -1:200000 IJ SOLN
INTRAMUSCULAR | Status: DC | PRN
  Administered 2014-03-29 (×2): 10 mL

## 2014-03-29 MED ORDER — IBUPROFEN 400 MG PO TABS
400.0000 mg | ORAL_TABLET | Freq: Once | ORAL | Status: AC
Start: 2014-03-29 — End: 2014-03-29
  Administered 2014-03-29: 400 mg via ORAL
  Filled 2014-03-29: qty 1

## 2014-03-29 MED ORDER — FENTANYL CITRATE 0.05 MG/ML IJ SOLN
INTRAMUSCULAR | Status: AC
  Filled 2014-03-29: qty 5

## 2014-03-29 MED ORDER — ONDANSETRON HCL 4 MG/2ML IJ SOLN
INTRAMUSCULAR | Status: AC
  Filled 2014-03-29: qty 2

## 2014-03-29 MED ORDER — SODIUM CHLORIDE 0.9 % IV SOLN
INTRAVENOUS | Status: DC | PRN
  Administered 2014-03-29: 7 mL

## 2014-03-29 MED ORDER — MIDAZOLAM HCL 5 MG/5ML IJ SOLN
INTRAMUSCULAR | Status: DC | PRN
  Administered 2014-03-29: 2 mg via INTRAVENOUS

## 2014-03-29 MED ORDER — MIDAZOLAM HCL 2 MG/2ML IJ SOLN
INTRAMUSCULAR | Status: AC
  Filled 2014-03-29: qty 2

## 2014-03-29 MED ORDER — 0.9 % SODIUM CHLORIDE (POUR BTL) OPTIME
TOPICAL | Status: DC | PRN
  Administered 2014-03-29: 1000 mL

## 2014-03-29 SURGICAL SUPPLY — 39 items
APPLIER CLIP 5 13 M/L LIGAMAX5 (MISCELLANEOUS) ×4
BANDAGE ADH SHEER 1  50/CT (GAUZE/BANDAGES/DRESSINGS) ×8 IMPLANT
BENZOIN TINCTURE PRP APPL 2/3 (GAUZE/BANDAGES/DRESSINGS) ×2 IMPLANT
CANISTER SUCTION 2500CC (MISCELLANEOUS) ×2 IMPLANT
CHLORAPREP W/TINT 26ML (MISCELLANEOUS) ×2 IMPLANT
CLIP APPLIE 5 13 M/L LIGAMAX5 (MISCELLANEOUS) ×2 IMPLANT
COVER MAYO STAND STRL (DRAPES) ×2 IMPLANT
COVER SURGICAL LIGHT HANDLE (MISCELLANEOUS) ×2 IMPLANT
DRAPE C-ARM 42X72 X-RAY (DRAPES) ×2 IMPLANT
DRAPE LAPAROSCOPIC ABDOMINAL (DRAPES) ×2 IMPLANT
DRAPE UTILITY 15X26 W/TAPE STR (DRAPE) ×4 IMPLANT
ELECT REM PT RETURN 9FT ADLT (ELECTROSURGICAL) ×2
ELECTRODE REM PT RTRN 9FT ADLT (ELECTROSURGICAL) ×1 IMPLANT
GLOVE BIOGEL PI IND STRL 7.0 (GLOVE) ×1 IMPLANT
GLOVE BIOGEL PI INDICATOR 7.0 (GLOVE) ×1
GLOVE SURG SIGNA 7.5 PF LTX (GLOVE) ×2 IMPLANT
GLOVE SURG SS PI 7.0 STRL IVOR (GLOVE) ×2 IMPLANT
GOWN STRL REUS W/ TWL LRG LVL3 (GOWN DISPOSABLE) ×5 IMPLANT
GOWN STRL REUS W/ TWL XL LVL3 (GOWN DISPOSABLE) ×1 IMPLANT
GOWN STRL REUS W/TWL LRG LVL3 (GOWN DISPOSABLE) ×5
GOWN STRL REUS W/TWL XL LVL3 (GOWN DISPOSABLE) ×1
KIT BASIN OR (CUSTOM PROCEDURE TRAY) ×2 IMPLANT
KIT ROOM TURNOVER OR (KITS) ×2 IMPLANT
NS IRRIG 1000ML POUR BTL (IV SOLUTION) ×2 IMPLANT
PAD ARMBOARD 7.5X6 YLW CONV (MISCELLANEOUS) ×2 IMPLANT
POUCH SPECIMEN RETRIEVAL 10MM (ENDOMECHANICALS) ×2 IMPLANT
SCISSORS LAP 5X35 DISP (ENDOMECHANICALS) ×2 IMPLANT
SET CHOLANGIOGRAPH 5 50 .035 (SET/KITS/TRAYS/PACK) ×2 IMPLANT
SET IRRIG TUBING LAPAROSCOPIC (IRRIGATION / IRRIGATOR) ×2 IMPLANT
SLEEVE ENDOPATH XCEL 5M (ENDOMECHANICALS) ×4 IMPLANT
SPECIMEN JAR SMALL (MISCELLANEOUS) ×2 IMPLANT
STRIP CLOSURE SKIN 1/2X4 (GAUZE/BANDAGES/DRESSINGS) ×2 IMPLANT
SUT MON AB 4-0 PC3 18 (SUTURE) ×2 IMPLANT
TOWEL OR 17X24 6PK STRL BLUE (TOWEL DISPOSABLE) ×2 IMPLANT
TOWEL OR 17X26 10 PK STRL BLUE (TOWEL DISPOSABLE) ×2 IMPLANT
TRAY LAPAROSCOPIC (CUSTOM PROCEDURE TRAY) ×2 IMPLANT
TROCAR XCEL BLUNT TIP 100MML (ENDOMECHANICALS) ×2 IMPLANT
TROCAR XCEL NON-BLD 5MMX100MML (ENDOMECHANICALS) ×2 IMPLANT
TUBING INSUFFLATION (TUBING) ×2 IMPLANT

## 2014-03-29 NOTE — Anesthesia Postprocedure Evaluation (Signed)
  Anesthesia Post-op Note  Patient: Mason [name]  Procedure(s) Performed: Procedure(s): LAPAROSCOPIC CHOLECYSTECTOMY WITH INTRAOPERATIVE CHOLANGIOGRAM (N/A)  Patient Location: PACU  Anesthesia Type:General  Level of Consciousness: awake  Airway and Oxygen Therapy: Patient Spontanous Breathing  Post-op Pain: mild  Post-op Assessment: Post-op Vital signs reviewed  Post-op Vital Signs: Reviewed  Last Vitals:  Filed Vitals:   03/29/14 1309  BP: 121/68  Pulse: 91  Temp:   Resp: 25    Complications: No apparent anesthesia complications

## 2014-03-29 NOTE — Plan of Care (Signed)
Problem: Phase II Progression Outcomes Goal: Obtain order to discontinue catheter if appropriate Outcome: Not Applicable Date Met:  85/63/14  Problem: Phase III Progression Outcomes Goal: Pain controlled on oral analgesia Outcome: Completed/Met Date Met:  03/29/14

## 2014-03-29 NOTE — Progress Notes (Signed)
Patient has temperature of 102.6 Medicated with 2 Tylenol as per PRN MD order. Will continue to monitor.  Esperanza Heir, RN

## 2014-03-29 NOTE — Progress Notes (Addendum)
Patient ID: Mason [name]  male  AYT:[id number]    DOB: Oct 23, 1945    DOA: 03/25/2014  PCP: No PCP Per Patient  Brief history of present illness  Patient is a 68 year old male presented with abdominal pain, in the upper abdominal area,nausea and vomiting. Patient was found to have acute pancreatitis with lipase over 3000. Abdominal ultrasound showed multiple gallstones. Gen. Surgery consulted  Assessment/Plan: Principal Problem:   Acute pancreatitis/gallstone pancreatitis: lipase improving, patient continued to spike fevers yesterday - Repeat CT abdomen and pelvis was obtained 11/7 showed no pseudocyst or abscess or necrotizing pancreatitis, + acute pancreatitis - continue NPO, IV fluids and pain control, antibiotics changed to imipenem - Gen. Surgery following, continue IV PPI and antiemetics - plan for laparoscopic cholecystectomy today  Active Problems: Hypokalemia - replace    Constipation - placed on Dulcolax suppository    Cholelithiasis - Management as #1    History of occasional/social alcohol use  - per patient's wife at the bedside and patient, he is not drinking, closely monitor   DVT Prophylaxis:heparin subcutaneous  Code Status: FC  Family Communication: discussed in detail with patient's wife at the bedside  Disposition:  Consultants: Gen. Surgery  Procedures:  Ultrasound abdomen  CT abdomen and pelvis  Antibiotics:  none    Subjective: Patient seen and examined, no acute complaints, + fevers yesterday  Objective: Weight change: 0 kg (0 lb)  Intake/Output Summary (Last 24 hours) at 03/29/14 1019 Last data filed at 03/29/14 0935  Gross per 24 hour  Intake   3810 ml  Output      0 ml  Net   3810 ml   Blood pressure 123/68, pulse 91, temperature 98.8 F (37.1 C), temperature source Oral, resp. rate 18, height 6' (1.829 m), weight 92.806 kg (204 lb 9.6 oz), SpO2 96 %.  Physical Exam: General: Alert and awake, oriented x3, NAD. CVS:  S1-S2 clear Chest: CTAB Abdomen: soft mildly tender in epigastric, RUQ area,distended, NBS Extremities: no c/c/e bilaterally   Lab Results: Basic Metabolic Panel:  Recent Labs Lab 03/28/14 0510 03/29/14 0745  NA 142 145  K 3.7 3.3*  CL 109 112  CO2 17* 18*  GLUCOSE 62* 70  BUN 19 18  CREATININE 1.31 1.05  CALCIUM 7.8* 7.5*   Liver Function Tests:  Recent Labs Lab 03/28/14 0510 03/29/14 0745  AST 37 47*  ALT 33 30  ALKPHOS 62 59  BILITOT 1.9* 1.8*  PROT 6.0 5.7*  ALBUMIN 2.5* 2.4*    Recent Labs Lab 03/28/14 0510 03/29/14 0745  LIPASE 77* 21   No results for input(s): AMMONIA in the last 168 hours. CBC:  Recent Labs Lab 03/26/14 0746  03/28/14 0510 03/29/14 0745  WBC 10.9*  < > 11.9* 9.9  NEUTROABS 10.2*  --   --   --   HGB 15.8  < > 13.2 11.0*  HCT 45.1  < > 38.2* 32.9*  MCV 92.8  < > 93.6 92.2  PLT 163  < > 128* 151  < > = values in this interval not displayed. Cardiac Enzymes:  Recent Labs Lab 03/25/14 2246  TROPONINI <0.30   BNP: Invalid input(s): POCBNP CBG: No results for input(s): GLUCAP in the last 168 hours.   Micro Results: No results found for this or any previous visit (from the past 240 hour(s)).  Studies/Results: Ct Abdomen Pelvis W Contrast  03/28/2014   CLINICAL DATA:  Pt c/o upper abd pain x 4 days, fever, nausea and vomiting red  blood. Hx of pancreatitis in Feb. No hx of CA. No hx of sx  EXAM: CT ABDOMEN AND PELVIS WITH CONTRAST  TECHNIQUE: Multidetector CT imaging of the abdomen and pelvis was performed using the standard protocol following bolus administration of intravenous contrast.  CONTRAST:  164mL OMNIPAQUE IOHEXOL 300 MG/ML  SOLN  COMPARISON:  07/11/2013  FINDINGS: There are changes of acute pancreatitis. The pancreas is heterogeneous with surrounding fluid and fat stranding. Enhancement is seen along the entirety the pancreas with some relative decreased enhancement along the periphery of the tail. Portal vein,  superior mesenteric vein and splenic veins are patent. No discrete fluid collection is seen to suggest an abscess or pseudocyst.  Small bilateral effusions. There is bilateral lung base atelectasis mostly in the dependent lower lobes.  Mild diffuse fatty infiltration of the liver. No liver mass or focal lesion. Normal spleen. Normal gallbladder. No bile duct dilation. No adrenal masses.  14 mm posterior midpole left renal cyst. Kidneys otherwise unremarkable. Normal ureters and bladder.  There are prominent nodes in the peripancreatic chain and along the gastrohepatic ligament. Several prominent left retroperitoneal lymph nodes are seen. There are no pathologically enlarged nodes.  Trace ascites collects in the posterior pelvic recess and tracks along the anterior perirenal fascia and pericolic gutters.  There are several colonic diverticula. No diverticulitis. No bowel inflammatory change. Normal appendix visualized.  No osteoblastic or osteolytic lesions.  IMPRESSION: 1. Acute pancreatitis. No convincing pancreatic necrosis. No evidence of an abscess or pseudocyst. No venous thrombosis. Trace ascites. 2. No other acute findings. 3. Small bilateral effusions.  Lung base atelectasis. 4. Mild diffuse hepatic steatosis.   Electronically Signed   By: Lajean Manes M.D.   On: 03/28/2014 19:57   US Abdomen Limited  03/26/2014   CLINICAL DATA:  RIGHT upper quadrant pain.  History of pancreatitis.  EXAM: US ABDOMEN LIMITED - RIGHT UPPER QUADRANT  COMPARISON:  CT of the abdomen and pelvis July 19, 2013  FINDINGS: Gallbladder:  Multiple echogenic mobile gallstones measure up to at least 7 mm. No gallbladder wall thickening, pericholecystic fluid. No sonographic Murphy's sign was elicited.  Common bile duct:  Diameter: 5 mm  Liver:  No focal lesion identified. Within normal limits in parenchymal echogenicity. Hepatopetal portal vein.  Included view of the pancreas demonstrates ductal dilatation at 4 mm.  IMPRESSION:  Cholelithiasis without sonographic findings of acute cholecystitis.  Mild pancreatic duct dilatation can be seen with acute or chronic pancreatitis.   Electronically Signed   By: Elon Alas   On: 03/26/2014 01:46    Medications: Scheduled Meds: . fluticasone  1 puff Inhalation BID  . heparin  5,000 Units Subcutaneous 3 times per day  . imipenem-cilastatin  500 mg Intravenous Q8H  . potassium chloride  40 mEq Oral Once      LOS: 4 days   Tasheem Elms M.D. [facility] 03/29/2014, 10:19 AM Pager: [phone]  If 7PM-7AM, please contact night-coverage [email or web address] Password [email or web address]  Addendum: This chart is addended per patient's request which is approved, patient drinks only occasionally/socially and does not have history of alcohol abuse   Betzy Barbier M.D. [facility] 06/03/2014, 5:23 PM  Pager: [phone]

## 2014-03-29 NOTE — Op Note (Signed)
Laparoscopic Cholecystectomy with IOC Procedure Note  Indications: This patient presents with symptomatic gallbladder disease and will undergo laparoscopic cholecystectomy.  Pre-operative Diagnosis: gallstone pancreatitis  Post-operative Diagnosis: Same  Surgeon: Coralie Keens A   Assistants: Dr. Mamie Nick. Toth  Anesthesia: General endotracheal anesthesia  ASA Class: 3  Procedure Details  The patient was seen again in the Holding Room. The risks, benefits, complications, treatment options, and expected outcomes were discussed with the patient. The possibilities of reaction to medication, pulmonary aspiration, perforation of viscus, bleeding, recurrent infection, finding a normal gallbladder, the need for additional procedures, failure to diagnose a condition, the possible need to convert to an open procedure, and creating a complication requiring transfusion or operation were discussed with the patient. The likelihood of improving the patient's symptoms with return to their baseline status is good.  The patient and/or family concurred with the proposed plan, giving informed consent. The site of surgery properly noted. The patient was taken to Operating Room, identified as Mason [name] and the procedure verified as Laparoscopic Cholecystectomy with Intraoperative Cholangiogram. A Time Out was held and the above information confirmed.  Prior to the induction of general anesthesia, antibiotic prophylaxis was administered. General endotracheal anesthesia was then administered and tolerated well. After the induction, the abdomen was prepped with Chloraprep and draped in the sterile fashion. The patient was positioned in the supine position.  Local anesthetic agent was injected into the skin near the umbilicus and an incision made. We dissected down to the abdominal fascia with blunt dissection.  The fascia was incised vertically and we entered the peritoneal cavity bluntly.  A pursestring suture of  0-Vicryl was placed around the fascial opening.  The Hasson cannula was inserted and secured with the stay suture.  Pneumoperitoneum was then created with CO2 and tolerated well without any adverse changes in the patient's vital signs. An 05-mm port was placed in the subxiphoid position.  Two 5-mm ports were placed in the right upper quadrant. All skin incisions were infiltrated with a local anesthetic agent before making the incision and placing the trocars.   We positioned the patient in reverse Trendelenburg, tilted slightly to the patient's left.  The gallbladder was identified, the fundus grasped and retracted cephalad. Adhesions were lysed bluntly and with the electrocautery where indicated, taking care not to injure any adjacent organs or viscus. The infundibulum was grasped and retracted laterally, exposing the peritoneum overlying the triangle of Calot. This was then divided and exposed in a blunt fashion. A critical view of the cystic duct and cystic artery was obtained.  The cystic duct was clearly identified and bluntly dissected circumferentially. The cystic duct was ligated with a clip distally.   An incision was made in the cystic duct and the [facility] cholangiogram catheter introduced. The catheter was secured using a clip. A cholangiogram was then obtained which showed good visualization of the distal and proximal biliary tree with no sign of filling defects or obstruction.  Contrast flowed easily into the duodenum. The catheter was then removed.   The cystic duct was then ligated with clips and divided. The cystic artery was identified, dissected free, ligated with clips and divided as well.   The gallbladder was dissected from the liver bed in retrograde fashion with the electrocautery. The gallbladder was removed and placed in an Endocatch sac. The liver bed was irrigated and inspected. Hemostasis was achieved with the electrocautery. Copious irrigation was utilized and was repeatedly aspirated  until clear.  The gallbladder and Endocatch sac were  then removed through the umbilical port site.  The pursestring suture was used to close the umbilical fascia.    We again inspected the right upper quadrant for hemostasis.  Pneumoperitoneum was released as we removed the trocars.  4-0 Monocryl was used to close the skin.   Benzoin, steri-strips, and clean dressings were applied. The patient was then extubated and brought to the recovery room in stable condition. Instrument, sponge, and needle counts were correct at closure and at the conclusion of the case.   Findings: Cholecystitis with Cholelithiasis Normal cholangiogram without CBD stone  Estimated Blood Loss: Minimal         Drains: 0         Specimens: Gallbladder           Complications: None; patient tolerated the procedure well.         Disposition: PACU - hemodynamically stable.         Condition: stable

## 2014-03-29 NOTE — Progress Notes (Signed)
Patient ID: Mason [name], male   DOB: 11-13-1945, 68 y.o.   MRN: [id number]  He denies abdominal pain this morning  Abdomen is protuberant but non tender Lipase is normal.  T.bili is still slightly elevated  I recommend going ahead with lap chole and IOC today.  I discussed the procedure in detail.    We discussed the risks and benefits of a laparoscopic cholecystectomy and cholangiogram including, but not limited to bleeding, infection, injury to surrounding structures such as the intestine or liver, bile leak, retained gallstones, need to convert to an open procedure, prolonged diarrhea, blood clots such as  DVT, common bile duct injury, anesthesia risks, and possible need for additional procedures.  The likelihood of improvement in symptoms and return to the patient's normal status is good. We discussed the typical post-operative recovery course.

## 2014-03-29 NOTE — Progress Notes (Signed)
Patient's temp. Rechecked and it is still elevated at 102.3 NP Rogue Bussing notified of temp via [place] pager system. Will continue to monitor.  Esperanza Heir, RN

## 2014-03-29 NOTE — Anesthesia Preprocedure Evaluation (Addendum)
Anesthesia Evaluation  Patient identified by MRN, date of birth, ID band Patient awake    Reviewed: H&P , NPO status , Patient's Chart, lab work & pertinent test results  Airway Mallampati: II       Dental  (+) Teeth Intact, Dental Advidsory Given   Pulmonary shortness of breath and with exertion, asthma ,  breath sounds clear to auscultation        Cardiovascular negative cardio ROS  Rhythm:Regular Rate:Normal     Neuro/Psych    GI/Hepatic Neg liver ROS, History noted. Ce   Endo/Other  negative endocrine ROS  Renal/GU negative Renal ROS     Musculoskeletal   Abdominal   Peds  Hematology   Anesthesia Other Findings   Reproductive/Obstetrics                            Anesthesia Physical Anesthesia Plan  ASA: III  Anesthesia Plan: General   Post-op Pain Management:    Induction: Intravenous  Airway Management Planned: Oral ETT  Additional Equipment:   Intra-op Plan:   Post-operative Plan: Possible Post-op intubation/ventilation  Informed Consent: I have reviewed the patients History and Physical, chart, labs and discussed the procedure including the risks, benefits and alternatives for the proposed anesthesia with the patient or authorized representative who has indicated his/her understanding and acceptance.   Dental Advisory Given and Dental advisory given  Plan Discussed with: CRNA and Anesthesiologist  Anesthesia Plan Comments:        Anesthesia Quick Evaluation

## 2014-03-29 NOTE — Anesthesia Procedure Notes (Signed)
Procedure Name: Intubation Date/Time: 03/29/2014 11:50 AM Performed by: Neldon Newport Pre-anesthesia Checklist: Patient identified, Timeout performed, Emergency Drugs available, Suction available and Patient being monitored Patient Re-evaluated:Patient Re-evaluated prior to inductionOxygen Delivery Method: Circle system utilized Preoxygenation: Pre-oxygenation with 100% oxygen Intubation Type: IV induction Ventilation: Mask ventilation without difficulty Laryngoscope Size: Mac and 4 Grade View: Grade I Tube type: Oral Tube size: 7.5 mm Number of attempts: 1 Placement Confirmation: positive ETCO2,  ETT inserted through vocal cords under direct vision and breath sounds checked- equal and bilateral Secured at: 23 cm Tube secured with: Tape Dental Injury: Teeth and Oropharynx as per pre-operative assessment

## 2014-03-29 NOTE — Transfer of Care (Signed)
Immediate Anesthesia Transfer of Care Note  Patient: Mason [name]  Procedure(s) Performed: Procedure(s): LAPAROSCOPIC CHOLECYSTECTOMY WITH INTRAOPERATIVE CHOLANGIOGRAM (N/A)  Patient Location: PACU  Anesthesia Type:General  Level of Consciousness: awake, alert  and oriented  Airway & Oxygen Therapy: Patient Spontanous Breathing and Patient connected to nasal cannula oxygen  Post-op Assessment: Report given to PACU RN, Post -op Vital signs reviewed and stable and Patient moving all extremities X 4  Post vital signs: Reviewed and stable  Complications: No apparent anesthesia complications

## 2014-03-29 NOTE — Progress Notes (Signed)
Received return phone-call from NP Rogue Bussing, who ordered Ibuprofen as well as applying ice to the patient's groin and axillary areas. This Probation officer v/u. Esperanza Heir, RN

## 2014-03-30 LAB — CBC
HCT: 31.2 % — ABNORMAL LOW (ref 39.0–52.0)
Hemoglobin: 10.6 g/dL — ABNORMAL LOW (ref 13.0–17.0)
MCH: 32 pg (ref 26.0–34.0)
MCHC: 34 g/dL (ref 30.0–36.0)
MCV: 94.3 fL (ref 78.0–100.0)
Platelets: 156 10*3/uL (ref 150–400)
RBC: 3.31 MIL/uL — ABNORMAL LOW (ref 4.22–5.81)
RDW: 14.1 % (ref 11.5–15.5)
WBC: 10.5 10*3/uL (ref 4.0–10.5)

## 2014-03-30 LAB — COMPREHENSIVE METABOLIC PANEL
ALT: 46 U/L (ref 0–53)
AST: 78 U/L — ABNORMAL HIGH (ref 0–37)
Albumin: 2 g/dL — ABNORMAL LOW (ref 3.5–5.2)
Alkaline Phosphatase: 71 U/L (ref 39–117)
Anion gap: 10 (ref 5–15)
BUN: 17 mg/dL (ref 6–23)
CO2: 20 mEq/L (ref 19–32)
Calcium: 7.5 mg/dL — ABNORMAL LOW (ref 8.4–10.5)
Chloride: 115 mEq/L — ABNORMAL HIGH (ref 96–112)
Creatinine, Ser: 1.13 mg/dL (ref 0.50–1.35)
GFR calc Af Amer: 76 mL/min — ABNORMAL LOW (ref >90)
GFR calc non Af Amer: 65 mL/min — ABNORMAL LOW (ref >90)
Glucose, Bld: 105 mg/dL — ABNORMAL HIGH (ref 70–99)
Potassium: 4 mEq/L (ref 3.7–5.3)
Sodium: 145 mEq/L (ref 137–147)
Total Bilirubin: 1.2 mg/dL (ref 0.3–1.2)
Total Protein: 5.5 g/dL — ABNORMAL LOW (ref 6.0–8.3)

## 2014-03-30 LAB — LIPASE, BLOOD: Lipase: 20 U/L (ref 11–59)

## 2014-03-30 MED ORDER — DOCUSATE SODIUM 100 MG PO CAPS
100.0000 mg | ORAL_CAPSULE | Freq: Two times a day (BID) | ORAL | Status: DC
  Administered 2014-03-30 (×2): 100 mg via ORAL
  Filled 2014-03-30 (×4): qty 1

## 2014-03-30 MED ORDER — POLYETHYLENE GLYCOL 3350 17 G PO PACK
17.0000 g | PACK | Freq: Every day | ORAL | Status: DC
  Administered 2014-03-30: 17 g via ORAL
  Filled 2014-03-30 (×2): qty 1

## 2014-03-30 MED ORDER — GI COCKTAIL ~~LOC~~
30.0000 mL | Freq: Once | ORAL | Status: AC
  Administered 2014-03-30: 30 mL via ORAL
  Filled 2014-03-30: qty 30

## 2014-03-30 NOTE — Progress Notes (Signed)
Patient's temp decreased to 100.3 s/p receiving 400 mg of Ibuprofen as per MD order. Will continue to monitor.  Esperanza Heir, RN

## 2014-03-30 NOTE — Plan of Care (Signed)
Problem: Phase III Progression Outcomes Goal: Activity at appropriate level-compared to baseline (UP IN CHAIR FOR HEMODIALYSIS)  Outcome: Completed/Met Date Met:  03/30/14

## 2014-03-30 NOTE — Progress Notes (Signed)
1 Day Post-Op  Subjective: Pt seen this am - delayed note entry Doing well. Didn't sleep well. Had temp to 102. Feels better now. No n/v.  Objective: Vital signs in last 24 hours: Temp:  [98.1 F (36.7 C)-102.7 F (39.3 C)] 99.7 F (37.6 C) (11/09 2244) Pulse Rate:  [80-94] 94 (11/09 2025) Resp:  [18-20] 18 (11/09 2025) BP: (111-135)/(65-70) 115/67 mmHg (11/09 2025) SpO2:  [94 %-96 %] 94 % (11/09 2025) Weight:  [204 lb (92.534 kg)-213 lb (96.616 kg)] 213 lb (96.616 kg) (11/09 2025) Last BM Date: 03/30/14  Intake/Output from previous day: 11/08 0701 - 11/09 0700 In: 1740 [P.O.:240; I.V.:1500] Out: 400 [Urine:400] Intake/Output this shift:    Alert, nad Soft, mild TTP. Incisions c/d/i cta ant  Lab Results:   Recent Labs  03/29/14 0745 03/30/14 0624  WBC 9.9 10.5  HGB 11.0* 10.6*  HCT 32.9* 31.2*  PLT 151 156   BMET  Recent Labs  03/29/14 0745 03/30/14 0624  NA 145 145  K 3.3* 4.0  CL 112 115*  CO2 18* 20  GLUCOSE 70 105*  BUN 18 17  CREATININE 1.05 1.13  CALCIUM 7.5* 7.5*   PT/INR No results for input(s): LABPROT, INR in the last 72 hours. ABG No results for input(s): PHART, HCO3 in the last 72 hours.  Invalid input(s): PCO2, PO2  Studies/Results: Dg Cholangiogram Operative  03/29/2014   CLINICAL DATA:  Cholecystitis, laparoscopic cholecystectomy  EXAM: INTRAOPERATIVE CHOLANGIOGRAM  TECHNIQUE: Cholangiographic images from the C-arm fluoroscopic device were submitted for interpretation post-operatively. Please see the procedural report for the amount of contrast and the fluoroscopy time utilized.  COMPARISON:  CT 03/28/2014  FINDINGS: Cholecystectomy clips are noted. Normal caliber common and intrahepatic ducts. No filling defect. Prompt excretion into the duodenum is noted.  IMPRESSION: Expected appearance of intraoperative cholangiogram.   Electronically Signed   By: Conchita Paris M.D.   On: 03/29/2014 12:44    Anti-infectives: Anti-infectives    Start     Dose/Rate Route Frequency Ordered Stop   03/29/14 1500  imipenem-cilastatin (PRIMAXIN) 500 mg in sodium chloride 0.9 % 100 mL IVPB     500 mg200 mL/hr over 30 Minutes Intravenous Every 6 hours 03/29/14 1400     03/28/14 1800  imipenem-cilastatin (PRIMAXIN) 500 mg in sodium chloride 0.9 % 100 mL IVPB  Status:  Discontinued     500 mg200 mL/hr over 30 Minutes Intravenous Every 8 hours 03/28/14 1625 03/29/14 1400   03/27/14 0800  piperacillin-tazobactam (ZOSYN) IVPB 3.375 g  Status:  Discontinued     3.375 g12.5 mL/hr over 240 Minutes Intravenous Every 8 hours 03/27/14 0727 03/28/14 1622      Assessment/Plan: s/p Procedure(s): LAPAROSCOPIC CHOLECYSTECTOMY WITH INTRAOPERATIVE CHOLANGIOGRAM (N/A) Looks ok but given high temp overnight i believe he needs observation today. If no signifcant temp today, home am Aggressive pulm toilet - IS, OOB Adv diet  Leighton Ruff. Redmond Pulling, MD, FACS FASMBS General, Bariatric, & Minimally Invasive Surgery [facility]   LOS: 5 days    Gayland Curry 03/30/2014

## 2014-03-30 NOTE — Progress Notes (Signed)
Patient instructed on how to use Incentive spirometer.  Patient returned demonstration.

## 2014-03-30 NOTE — Progress Notes (Addendum)
Patient ID: Mason [name]  male  WUJ:[id number]    DOB: 12/25/1945    DOA: 03/25/2014  PCP: No PCP Per Patient  Brief history of present illness  Patient is a 68 year old male presented with abdominal pain, in the upper abdominal area,nausea and vomiting. Patient was found to have acute pancreatitis with lipase over 3000. Abdominal ultrasound showed multiple gallstones. Gen. Surgery consulted, patient underwent laparoscopic cholecystectomy on 11/8  Assessment/Plan: Principal Problem:   Acute pancreatitis/gallstone pancreatitis: lipase improving, spike fevers+, Tmax 102.6 last night, postop day 1 - Repeat CT abdomen and pelvis was obtained 11/7 showed no pseudocyst or abscess or necrotizing pancreatitis, + acute pancreatitis - Gen. Surgery following, patient underwent laparoscopic cholecystectomy on 11/8 - advanced diet to follow today, continue IV antibiotics - Likely DC home in a.m. if fever free for at least 24 hours, blood cultures negative so far   Active Problems: Abdominal distention with constipation - Placed on Colace, MiraLAX    Cholelithiasis - Management as #1, status post laparoscopic cholecystectomy    History of occasional/social alcohol use - per patient's wife at the bedside and patient, he is not drinking, no alcohol withdrawals   DVT Prophylaxis:heparin subcutaneous  Code Status: FC  Family Communication: discussed in detail with patient's wife at the bedside  Disposition:  Consultants: Gen. Surgery  Procedures:  Ultrasound abdomen  CT abdomen and pelvis  Antibiotics:  none    Subjective: Patient seen and examined,still having fevers 102.6 last night, abdomen distention  Objective: Weight change: 0 kg (0 lb)  Intake/Output Summary (Last 24 hours) at 03/30/14 1028 Last data filed at 03/30/14 0300  Gross per 24 hour  Intake   1740 ml  Output    400 ml  Net   1340 ml   Blood pressure 111/65, pulse 85, temperature 99.6 F (37.6 C),  temperature source Oral, resp. rate 18, height 6' (1.829 m), weight 92.534 kg (204 lb), SpO2 94 %.  Physical Exam: General: Alert and awake, oriented x3, NAD. CVS: S1-S2 clear Chest: CTAB Abdomen: soft, abdominal distension, hypoactive BS Extremities: no c/c/e bilaterally   Lab Results: Basic Metabolic Panel:  Recent Labs Lab 03/29/14 0745 03/30/14 0624  NA 145 145  K 3.3* 4.0  CL 112 115*  CO2 18* 20  GLUCOSE 70 105*  BUN 18 17  CREATININE 1.05 1.13  CALCIUM 7.5* 7.5*   Liver Function Tests:  Recent Labs Lab 03/29/14 0745 03/30/14 0624  AST 47* 78*  ALT 30 46  ALKPHOS 59 71  BILITOT 1.8* 1.2  PROT 5.7* 5.5*  ALBUMIN 2.4* 2.0*    Recent Labs Lab 03/29/14 0745 03/30/14 0624  LIPASE 21 20   No results for input(s): AMMONIA in the last 168 hours. CBC:  Recent Labs Lab 03/26/14 0746  03/29/14 0745 03/30/14 0624  WBC 10.9*  < > 9.9 10.5  NEUTROABS 10.2*  --   --   --   HGB 15.8  < > 11.0* 10.6*  HCT 45.1  < > 32.9* 31.2*  MCV 92.8  < > 92.2 94.3  PLT 163  < > 151 156  < > = values in this interval not displayed. Cardiac Enzymes:  Recent Labs Lab 03/25/14 2246  TROPONINI <0.30   BNP: Invalid input(s): POCBNP CBG: No results for input(s): GLUCAP in the last 168 hours.   Micro Results: Recent Results (from the past 240 hour(s))  Culture, blood (routine x 2)     Status: None (Preliminary result)   Collection Time:  03/27/14  9:10 AM  Result Value Ref Range Status   Specimen Description BLOOD RIGHT ANTECUBITAL  Final   Special Requests BOTTLES DRAWN AEROBIC AND ANAEROBIC 10CC  Final   Culture  Setup Time   Final    03/27/2014 15:41 Performed at [facility]    Culture   Final           BLOOD CULTURE RECEIVED NO GROWTH TO DATE CULTURE WILL BE HELD FOR 5 DAYS BEFORE ISSUING A FINAL NEGATIVE REPORT Performed at [facility]    Report Status PENDING  Incomplete  Culture, blood (routine x 2)     Status: None (Preliminary  result)   Collection Time: 03/27/14  9:16 AM  Result Value Ref Range Status   Specimen Description BLOOD RIGHT HAND  Final   Special Requests BOTTLES DRAWN AEROBIC AND ANAEROBIC 10CC  Final   Culture  Setup Time   Final    03/27/2014 15:39 Performed at [facility]    Culture   Final           BLOOD CULTURE RECEIVED NO GROWTH TO DATE CULTURE WILL BE HELD FOR 5 DAYS BEFORE ISSUING A FINAL NEGATIVE REPORT Performed at [facility]    Report Status PENDING  Incomplete  Surgical pcr screen     Status: None   Collection Time: 03/29/14 10:51 AM  Result Value Ref Range Status   MRSA, PCR NEGATIVE NEGATIVE Final   Staphylococcus aureus NEGATIVE NEGATIVE Final    Comment:        The Xpert SA Assay (FDA approved for NASAL specimens in patients over 44 years of age), is one component of a comprehensive surveillance program.  Test performance has been validated by [employer] for patients greater than or equal to 66 year old. It is not intended to diagnose infection nor to guide or monitor treatment.     Studies/Results: Dg Cholangiogram Operative  03/29/2014   CLINICAL DATA:  Cholecystitis, laparoscopic cholecystectomy  EXAM: INTRAOPERATIVE CHOLANGIOGRAM  TECHNIQUE: Cholangiographic images from the C-arm fluoroscopic device were submitted for interpretation post-operatively. Please see the procedural report for the amount of contrast and the fluoroscopy time utilized.  COMPARISON:  CT 03/28/2014  FINDINGS: Cholecystectomy clips are noted. Normal caliber common and intrahepatic ducts. No filling defect. Prompt excretion into the duodenum is noted.  IMPRESSION: Expected appearance of intraoperative cholangiogram.   Electronically Signed   By: Conchita Paris M.D.   On: 03/29/2014 12:44   Ct Abdomen Pelvis W Contrast  03/28/2014   CLINICAL DATA:  Pt c/o upper abd pain x 4 days, fever, nausea and vomiting red blood. Hx of pancreatitis in Feb. No hx of CA. No hx of sx  EXAM:  CT ABDOMEN AND PELVIS WITH CONTRAST  TECHNIQUE: Multidetector CT imaging of the abdomen and pelvis was performed using the standard protocol following bolus administration of intravenous contrast.  CONTRAST:  127mL OMNIPAQUE IOHEXOL 300 MG/ML  SOLN  COMPARISON:  07/11/2013  FINDINGS: There are changes of acute pancreatitis. The pancreas is heterogeneous with surrounding fluid and fat stranding. Enhancement is seen along the entirety the pancreas with some relative decreased enhancement along the periphery of the tail. Portal vein, superior mesenteric vein and splenic veins are patent. No discrete fluid collection is seen to suggest an abscess or pseudocyst.  Small bilateral effusions. There is bilateral lung base atelectasis mostly in the dependent lower lobes.  Mild diffuse fatty infiltration of the liver. No liver mass or focal lesion. Normal  spleen. Normal gallbladder. No bile duct dilation. No adrenal masses.  14 mm posterior midpole left renal cyst. Kidneys otherwise unremarkable. Normal ureters and bladder.  There are prominent nodes in the peripancreatic chain and along the gastrohepatic ligament. Several prominent left retroperitoneal lymph nodes are seen. There are no pathologically enlarged nodes.  Trace ascites collects in the posterior pelvic recess and tracks along the anterior perirenal fascia and pericolic gutters.  There are several colonic diverticula. No diverticulitis. No bowel inflammatory change. Normal appendix visualized.  No osteoblastic or osteolytic lesions.  IMPRESSION: 1. Acute pancreatitis. No convincing pancreatic necrosis. No evidence of an abscess or pseudocyst. No venous thrombosis. Trace ascites. 2. No other acute findings. 3. Small bilateral effusions.  Lung base atelectasis. 4. Mild diffuse hepatic steatosis.   Electronically Signed   By: Lajean Manes M.D.   On: 03/28/2014 19:57   US Abdomen Limited  03/26/2014   CLINICAL DATA:  RIGHT upper quadrant pain.  History of  pancreatitis.  EXAM: US ABDOMEN LIMITED - RIGHT UPPER QUADRANT  COMPARISON:  CT of the abdomen and pelvis July 19, 2013  FINDINGS: Gallbladder:  Multiple echogenic mobile gallstones measure up to at least 7 mm. No gallbladder wall thickening, pericholecystic fluid. No sonographic Murphy's sign was elicited.  Common bile duct:  Diameter: 5 mm  Liver:  No focal lesion identified. Within normal limits in parenchymal echogenicity. Hepatopetal portal vein.  Included view of the pancreas demonstrates ductal dilatation at 4 mm.  IMPRESSION: Cholelithiasis without sonographic findings of acute cholecystitis.  Mild pancreatic duct dilatation can be seen with acute or chronic pancreatitis.   Electronically Signed   By: Elon Alas   On: 03/26/2014 01:46    Medications: Scheduled Meds: . docusate sodium  100 mg Oral BID  . fluticasone  1 puff Inhalation BID  . heparin  5,000 Units Subcutaneous 3 times per day  . imipenem-cilastatin  500 mg Intravenous Q6H  . polyethylene glycol  17 g Oral Daily      LOS: 5 days   Calvert Charland M.D. [facility] 03/30/2014, 10:28 AM Pager: [phone]  If 7PM-7AM, please contact night-coverage [email or web address] Password [email or web address]  Addendum This chart is addended per patient's request which is approved, patient drinks only occasionally/socially and does not have history of alcohol abuse    Leisel Pinette M.D. [facility] 06/03/2014, 5:24 PM  Pager: [phone]

## 2014-03-30 NOTE — Evaluation (Signed)
Physical Therapy Evaluation and Discharge Patient Details Name: Mason [name] MRN: [id number] DOB: 01/19/1946 Today's Date: 03/30/2014   History of Present Illness  Adm 03/25/14 with abd pain. 11/08 had laparoscopic cholecystectomy. PHMx-asthma, pancreatitis, gout  Clinical Impression  Patient evaluated by Physical Therapy with no further acute PT needs identified. All education has been completed and the patient has no further questions. Pt was dyspneic during ambulation with SaO2 90-91% on room air, however decr to 87% with stair training. Educated on use of incentive spirometer. See below for any follow-up Physial Therapy or equipment needs. PT is signing off. Thank you for this referral.     Follow Up Recommendations No PT follow up    Equipment Recommendations  None recommended by PT    Recommendations for Other Services       Precautions / Restrictions Precautions Precautions: None      Mobility  Bed Mobility                  Transfers Overall transfer level: Independent Equipment used: None                Ambulation/Gait Ambulation/Gait assistance: Supervision Ambulation Distance (Feet): 250 Feet Assistive device: None Gait Pattern/deviations: WFL(Within Functional Limits) Gait velocity: decr due to dyspnea Gait velocity interpretation: Below normal speed for age/gender General Gait Details: steady, however with dyspnea and SaO2 decr  Stairs Stairs: Yes Stairs assistance: Supervision Stair Management: No rails;Forwards Number of Stairs: 7 General stair comments: No limitations other than dyspnea  Wheelchair Mobility    Modified Rankin (Stroke Patients Only)       Balance Overall balance assessment: No apparent balance deficits (not formally assessed)                                           Pertinent Vitals/Pain Pain Assessment: 0-10 Pain Score: 1  Pain Location: abd Pain Intervention(s): Limited activity within  patient's tolerance;Monitored during session    [place] expects to be discharged to:: Private residence Living Arrangements: Spouse/significant other Available Help at Discharge: Family Type of Home: House Home Access: Stairs to enter Entrance Stairs-Rails: None Entrance Stairs-Number of Steps: 4 Home Layout: Bed/bath upstairs;Two level Home Equipment: None      Prior Function Level of Independence: Independent         Comments: reports he uses inhaler for his asthma regularly     Hand Dominance        Extremity/Trunk Assessment   Upper Extremity Assessment: Overall WFL for tasks assessed           Lower Extremity Assessment: Overall WFL for tasks assessed      Cervical / Trunk Assessment: Normal  Communication   Communication: No difficulties  Cognition Arousal/Alertness: Awake/alert Behavior During Therapy: WFL for tasks assessed/performed Overall Cognitive Status: Within Functional Limits for tasks assessed                      General Comments General comments (skin integrity, edema, etc.): Educated pt on importance of use of incentive spirometer. Educated on doing 10 breaths/hour and to make them SLOW (including use of indicator re: speed of breath).    Exercises        Assessment/Plan    PT Assessment Patent does not need any further PT services  PT Diagnosis Difficulty walking   PT Problem List  PT Treatment Interventions     PT Goals (Current goals can be found in the Care Plan section) Acute Rehab PT Goals PT Goal Formulation: All assessment and education complete, DC therapy    Frequency     Barriers to discharge        Co-evaluation               End of Session   Activity Tolerance: Treatment limited secondary to medical complications (Comment) (dyspnea) Patient left: with call bell/phone within reach (sitting EOB to finish his lunch) Nurse Communication: Mobility status (decr SaO2; no further  PT needs)         Time: 1031-5945 PT Time Calculation (min): 12 min   Charges:   PT Evaluation $Initial PT Evaluation Tier I: 1 Procedure     PT G Codes:          Maclean Foister 03/31/14, 2:02 PM Pager [phone]

## 2014-03-30 NOTE — Care Management Note (Signed)
CARE MANAGEMENT NOTE 03/30/2014  Patient:  Mason [name], Mason [name]   Account Number:  [id number]  Date Initiated:  03/27/2014  Documentation initiated by:  Tea Collums  Subjective/Objective Assessment:   CM following for progression and d/c planning.     Action/Plan:   03/27/14 Met with pt and IM given, pt plans to d/c back to home, post op.   Anticipated DC Date:  03/31/2014   Anticipated DC Plan:  HOME/SELF CARE         Choice offered to / List presented to:             Status of service:  In process, will continue to follow Medicare Important Message given?  YES (If response is "NO", the following Medicare IM given date fields will be blank) Date Medicare IM given:  03/27/2014 Medicare IM given by:  Abdulkarim Eberlin Date Additional Medicare IM given:  03/30/2014 Additional Medicare IM given by:  [facility]  Discharge Disposition:    Per UR Regulation:    If discussed at Long Length of Stay Meetings, dates discussed:    Comments:

## 2014-03-31 ENCOUNTER — Inpatient Hospital Stay (HOSPITAL_COMMUNITY): Payer: Medicare Other

## 2014-03-31 ENCOUNTER — Encounter (HOSPITAL_COMMUNITY): Payer: Self-pay | Admitting: Surgery

## 2014-03-31 DIAGNOSIS — K8000 Calculus of gallbladder with acute cholecystitis without obstruction: Secondary | ICD-10-CM

## 2014-03-31 LAB — COMPREHENSIVE METABOLIC PANEL
ALT: 58 U/L — ABNORMAL HIGH (ref 0–53)
AST: 72 U/L — ABNORMAL HIGH (ref 0–37)
Albumin: 2.4 g/dL — ABNORMAL LOW (ref 3.5–5.2)
Alkaline Phosphatase: 73 U/L (ref 39–117)
Anion gap: 10 (ref 5–15)
BUN: 17 mg/dL (ref 6–23)
CO2: 23 mEq/L (ref 19–32)
Calcium: 7.9 mg/dL — ABNORMAL LOW (ref 8.4–10.5)
Chloride: 112 mEq/L (ref 96–112)
Creatinine, Ser: 1.16 mg/dL (ref 0.50–1.35)
GFR calc Af Amer: 73 mL/min — ABNORMAL LOW (ref >90)
GFR calc non Af Amer: 63 mL/min — ABNORMAL LOW (ref >90)
Glucose, Bld: 132 mg/dL — ABNORMAL HIGH (ref 70–99)
Potassium: 3.6 mEq/L — ABNORMAL LOW (ref 3.7–5.3)
Sodium: 145 mEq/L (ref 137–147)
Total Bilirubin: 1.2 mg/dL (ref 0.3–1.2)
Total Protein: 6 g/dL (ref 6.0–8.3)

## 2014-03-31 LAB — CBC
HCT: 32 % — ABNORMAL LOW (ref 39.0–52.0)
Hemoglobin: 11 g/dL — ABNORMAL LOW (ref 13.0–17.0)
MCH: 32.4 pg (ref 26.0–34.0)
MCHC: 34.4 g/dL (ref 30.0–36.0)
MCV: 94.1 fL (ref 78.0–100.0)
Platelets: 182 10*3/uL (ref 150–400)
RBC: 3.4 MIL/uL — ABNORMAL LOW (ref 4.22–5.81)
RDW: 14.2 % (ref 11.5–15.5)
WBC: 11.3 10*3/uL — ABNORMAL HIGH (ref 4.0–10.5)

## 2014-03-31 LAB — CLOSTRIDIUM DIFFICILE BY PCR: Toxigenic C. Difficile by PCR: NEGATIVE

## 2014-03-31 NOTE — Progress Notes (Addendum)
Patient ID: Mason [name]  male  [id number]    DOB: 11/17/1945    DOA: 03/25/2014  PCP: No PCP Per Patient  Brief history of present illness  Patient is a 68 year old male presented with abdominal pain, in the upper abdominal area,nausea and vomiting. Patient was found to have acute pancreatitis with lipase over 3000. Abdominal ultrasound showed multiple gallstones. Gen. Surgery consulted, patient underwent laparoscopic cholecystectomy on 11/8  Assessment/Plan: Principal Problem:   Acute pancreatitis/gallstone pancreatitis:status post episodic cholecystectomy, still spiking fevers, Tmax 102.6 this morning, postop day 2 - Repeat CT abdomen and pelvis was obtained 11/7 showed no pseudocyst or abscess or necrotizing pancreatitis, + acute pancreatitis - Gen. Surgery following, patient underwent laparoscopic cholecystectomy on 11/8 - chest x-rays negative except bibasilar atelectasis, UA negative, blood culture has been negative so far. Patient had diarrhea overnight however he received Colace and MiraLAX for the constipation. If C. Difficile is negative, will stop antibiotics and observe for next 24 hours. I spoke with Dr. Tommy Medal on phone and recommended the same.  Active Problems: Abdominal distention with constipation - improved after several bowel movements with Colace and MiraLAX    Cholelithiasis - Management as #1, status post laparoscopic cholecystectomy    History of occasional/social alcohol use -  no alcohol withdrawals   DVT Prophylaxis:heparin subcutaneous  Code Status: FC  Family Communication: discussed in detail with patient's wife at the bedside  Disposition: DC tomorrow if no fevers for next 24hrs   Consultants: Gen. Surgery  Procedures:  Ultrasound abdomen  CT abdomen and pelvis  Antibiotics:  IV imipenem    Subjective: Patient seen and examined,still having fevers 102.6 this morning, diarrhea overnight from colace and miralax, feels better with  abd distension  Objective: Weight change: 3.81 kg (8 lb 6.4 oz)  Intake/Output Summary (Last 24 hours) at 03/31/14 1036 Last data filed at 03/30/14 1100  Gross per 24 hour  Intake    240 ml  Output    200 ml  Net     40 ml   Blood pressure 124/69, pulse 95, temperature 102.6 F (39.2 C), temperature source Oral, resp. rate 18, height 6' (1.829 m), weight 96.616 kg (213 lb), SpO2 95 %.  Physical Exam: General: Alert and awake, oriented x3, NAD. CVS: S1-S2 clear Chest: CTAB Abdomen: soft, nontender, nondistended Extremities: no c/c/e bilaterally   Lab Results: Basic Metabolic Panel:  Recent Labs Lab 03/30/14 0624 03/31/14 0911  NA 145 145  K 4.0 3.6*  CL 115* 112  CO2 20 23  GLUCOSE 105* 132*  BUN 17 17  CREATININE 1.13 1.16  CALCIUM 7.5* 7.9*   Liver Function Tests:  Recent Labs Lab 03/30/14 0624 03/31/14 0911  AST 78* 72*  ALT 46 58*  ALKPHOS 71 73  BILITOT 1.2 1.2  PROT 5.5* 6.0  ALBUMIN 2.0* 2.4*    Recent Labs Lab 03/29/14 0745 03/30/14 0624  LIPASE 21 20   No results for input(s): AMMONIA in the last 168 hours. CBC:  Recent Labs Lab 03/26/14 0746  03/30/14 0624 03/31/14 0911  WBC 10.9*  < > 10.5 11.3*  NEUTROABS 10.2*  --   --   --   HGB 15.8  < > 10.6* 11.0*  HCT 45.1  < > 31.2* 32.0*  MCV 92.8  < > 94.3 94.1  PLT 163  < > 156 182  < > = values in this interval not displayed. Cardiac Enzymes:  Recent Labs Lab 03/25/14 2246  TROPONINI <0.30  BNP: Invalid input(s): POCBNP CBG: No results for input(s): GLUCAP in the last 168 hours.   Micro Results: Recent Results (from the past 240 hour(s))  Culture, blood (routine x 2)     Status: None (Preliminary result)   Collection Time: 03/27/14  9:10 AM  Result Value Ref Range Status   Specimen Description BLOOD RIGHT ANTECUBITAL  Final   Special Requests BOTTLES DRAWN AEROBIC AND ANAEROBIC 10CC  Final   Culture  Setup Time   Final    03/27/2014 15:41 Performed at [facility]    Culture   Final           BLOOD CULTURE RECEIVED NO GROWTH TO DATE CULTURE WILL BE HELD FOR 5 DAYS BEFORE ISSUING A FINAL NEGATIVE REPORT Performed at [facility]    Report Status PENDING  Incomplete  Culture, blood (routine x 2)     Status: None (Preliminary result)   Collection Time: 03/27/14  9:16 AM  Result Value Ref Range Status   Specimen Description BLOOD RIGHT HAND  Final   Special Requests BOTTLES DRAWN AEROBIC AND ANAEROBIC 10CC  Final   Culture  Setup Time   Final    03/27/2014 15:39 Performed at [facility]    Culture   Final           BLOOD CULTURE RECEIVED NO GROWTH TO DATE CULTURE WILL BE HELD FOR 5 DAYS BEFORE ISSUING A FINAL NEGATIVE REPORT Performed at [facility]    Report Status PENDING  Incomplete  Surgical pcr screen     Status: None   Collection Time: 03/29/14 10:51 AM  Result Value Ref Range Status   MRSA, PCR NEGATIVE NEGATIVE Final   Staphylococcus aureus NEGATIVE NEGATIVE Final    Comment:        The Xpert SA Assay (FDA approved for NASAL specimens in patients over 65 years of age), is one component of a comprehensive surveillance program.  Test performance has been validated by [employer] for patients greater than or equal to 60 year old. It is not intended to diagnose infection nor to guide or monitor treatment.     Studies/Results: Dg Chest 2 View  03/31/2014   CLINICAL DATA:  Postop fever following cholecystectomy.  EXAM: CHEST  2 VIEW  COMPARISON:  07/16/2013  FINDINGS: Bibasilar atelectasis, similar to prior study. Suspect small effusions, also stable. Heart is upper limits normal in size. Mediastinal contours are within normal limits. No acute bony abnormality.  IMPRESSION: Stable bibasilar opacities, likely atelectasis with small effusions.   Electronically Signed   By: Rolm Baptise M.D.   On: 03/31/2014 09:57   Dg Cholangiogram Operative  03/29/2014   CLINICAL DATA:  Cholecystitis, laparoscopic  cholecystectomy  EXAM: INTRAOPERATIVE CHOLANGIOGRAM  TECHNIQUE: Cholangiographic images from the C-arm fluoroscopic device were submitted for interpretation post-operatively. Please see the procedural report for the amount of contrast and the fluoroscopy time utilized.  COMPARISON:  CT 03/28/2014  FINDINGS: Cholecystectomy clips are noted. Normal caliber common and intrahepatic ducts. No filling defect. Prompt excretion into the duodenum is noted.  IMPRESSION: Expected appearance of intraoperative cholangiogram.   Electronically Signed   By: Conchita Paris M.D.   On: 03/29/2014 12:44   Ct Abdomen Pelvis W Contrast  03/28/2014   CLINICAL DATA:  Pt c/o upper abd pain x 4 days, fever, nausea and vomiting red blood. Hx of pancreatitis in Feb. No hx of CA. No hx of sx  EXAM: CT ABDOMEN AND PELVIS WITH  CONTRAST  TECHNIQUE: Multidetector CT imaging of the abdomen and pelvis was performed using the standard protocol following bolus administration of intravenous contrast.  CONTRAST:  168mL OMNIPAQUE IOHEXOL 300 MG/ML  SOLN  COMPARISON:  07/11/2013  FINDINGS: There are changes of acute pancreatitis. The pancreas is heterogeneous with surrounding fluid and fat stranding. Enhancement is seen along the entirety the pancreas with some relative decreased enhancement along the periphery of the tail. Portal vein, superior mesenteric vein and splenic veins are patent. No discrete fluid collection is seen to suggest an abscess or pseudocyst.  Small bilateral effusions. There is bilateral lung base atelectasis mostly in the dependent lower lobes.  Mild diffuse fatty infiltration of the liver. No liver mass or focal lesion. Normal spleen. Normal gallbladder. No bile duct dilation. No adrenal masses.  14 mm posterior midpole left renal cyst. Kidneys otherwise unremarkable. Normal ureters and bladder.  There are prominent nodes in the peripancreatic chain and along the gastrohepatic ligament. Several prominent left retroperitoneal  lymph nodes are seen. There are no pathologically enlarged nodes.  Trace ascites collects in the posterior pelvic recess and tracks along the anterior perirenal fascia and pericolic gutters.  There are several colonic diverticula. No diverticulitis. No bowel inflammatory change. Normal appendix visualized.  No osteoblastic or osteolytic lesions.  IMPRESSION: 1. Acute pancreatitis. No convincing pancreatic necrosis. No evidence of an abscess or pseudocyst. No venous thrombosis. Trace ascites. 2. No other acute findings. 3. Small bilateral effusions.  Lung base atelectasis. 4. Mild diffuse hepatic steatosis.   Electronically Signed   By: Lajean Manes M.D.   On: 03/28/2014 19:57   US Abdomen Limited  03/26/2014   CLINICAL DATA:  RIGHT upper quadrant pain.  History of pancreatitis.  EXAM: US ABDOMEN LIMITED - RIGHT UPPER QUADRANT  COMPARISON:  CT of the abdomen and pelvis July 19, 2013  FINDINGS: Gallbladder:  Multiple echogenic mobile gallstones measure up to at least 7 mm. No gallbladder wall thickening, pericholecystic fluid. No sonographic Murphy's sign was elicited.  Common bile duct:  Diameter: 5 mm  Liver:  No focal lesion identified. Within normal limits in parenchymal echogenicity. Hepatopetal portal vein.  Included view of the pancreas demonstrates ductal dilatation at 4 mm.  IMPRESSION: Cholelithiasis without sonographic findings of acute cholecystitis.  Mild pancreatic duct dilatation can be seen with acute or chronic pancreatitis.   Electronically Signed   By: Elon Alas   On: 03/26/2014 01:46    Medications: Scheduled Meds: . fluticasone  1 puff Inhalation BID  . heparin  5,000 Units Subcutaneous 3 times per day  . imipenem-cilastatin  500 mg Intravenous Q6H      LOS: 6 days   Abiola Behring M.D. [facility] 03/31/2014, 10:36 AM Pager: [phone]  If 7PM-7AM, please contact night-coverage [email or web address] Password [email or web address]  Addendum This chart is addended per patient's  request which is approved, patient drinks only occasionally/socially and does not have history of alcohol abuse   Weaver Tweed M.D. [facility] 06/03/2014, 5:24 PM  Pager: [phone]

## 2014-03-31 NOTE — Plan of Care (Signed)
Problem: Phase I Progression Outcomes Goal: Initial discharge plan identified Outcome: Completed/Met Date Met:  03/31/14

## 2014-03-31 NOTE — Plan of Care (Signed)
Problem: Consults Goal: Diabetes Guidelines if Diabetic/Glucose > 140 If diabetic or lab glucose is > 140 mg/dl - Initiate Diabetes/Hyperglycemia Guidelines & Document Interventions  Outcome: Not Applicable Date Met:  03/31/14     

## 2014-03-31 NOTE — Progress Notes (Signed)
Patient ID: Mason [name], male   DOB: 07-05-1945, 67 y.o.   MRN: [id number] 2 Days Post-Op  Subjective: Pt feels ok.  Minimal pain, tolerating a regular diet.    Objective: Vital signs in last 24 hours: Temp:  [98.1 F (36.7 C)-102.7 F (39.3 C)] 102.6 F (39.2 C) (11/10 0526) Pulse Rate:  [80-95] 95 (11/10 0526) Resp:  [18-20] 18 (11/10 0526) BP: (115-135)/(66-69) 124/69 mmHg (11/10 0526) SpO2:  [94 %-96 %] 95 % (11/10 0526) Weight:  [213 lb (96.616 kg)] 213 lb (96.616 kg) (11/10 0500) Last BM Date: 03/30/14  Intake/Output from previous day: 11/09 0701 - 11/10 0700 In: 240 [P.O.:240] Out: 200 [Urine:200] Intake/Output this shift:    PE: Abd: soft, appropriately tender, +BS, ND, incisions c/d/i  Lab Results:   Recent Labs  03/30/14 0624 03/31/14 0911  WBC 10.5 11.3*  HGB 10.6* 11.0*  HCT 31.2* 32.0*  PLT 156 182   BMET  Recent Labs  03/29/14 0745 03/30/14 0624  NA 145 145  K 3.3* 4.0  CL 112 115*  CO2 18* 20  GLUCOSE 70 105*  BUN 18 17  CREATININE 1.05 1.13  CALCIUM 7.5* 7.5*   PT/INR No results for input(s): LABPROT, INR in the last 72 hours. CMP     Component Value Date/Time   NA 145 03/30/2014 0624   K 4.0 03/30/2014 0624   CL 115* 03/30/2014 0624   CO2 20 03/30/2014 0624   GLUCOSE 105* 03/30/2014 0624   BUN 17 03/30/2014 0624   CREATININE 1.13 03/30/2014 0624   CALCIUM 7.5* 03/30/2014 0624   PROT 5.5* 03/30/2014 0624   ALBUMIN 2.0* 03/30/2014 0624   AST 78* 03/30/2014 0624   ALT 46 03/30/2014 0624   ALKPHOS 71 03/30/2014 0624   BILITOT 1.2 03/30/2014 0624   GFRNONAA 65* 03/30/2014 0624   GFRAA 76* 03/30/2014 0624   Lipase     Component Value Date/Time   LIPASE 20 03/30/2014 0624       Studies/Results: Dg Chest 2 View  03/31/2014   CLINICAL DATA:  Postop fever following cholecystectomy.  EXAM: CHEST  2 VIEW  COMPARISON:  07/16/2013  FINDINGS: Bibasilar atelectasis, similar to prior study. Suspect small effusions, also stable.  Heart is upper limits normal in size. Mediastinal contours are within normal limits. No acute bony abnormality.  IMPRESSION: Stable bibasilar opacities, likely atelectasis with small effusions.   Electronically Signed   By: Rolm Baptise M.D.   On: 03/31/2014 09:57   Dg Cholangiogram Operative  03/29/2014   CLINICAL DATA:  Cholecystitis, laparoscopic cholecystectomy  EXAM: INTRAOPERATIVE CHOLANGIOGRAM  TECHNIQUE: Cholangiographic images from the C-arm fluoroscopic device were submitted for interpretation post-operatively. Please see the procedural report for the amount of contrast and the fluoroscopy time utilized.  COMPARISON:  CT 03/28/2014  FINDINGS: Cholecystectomy clips are noted. Normal caliber common and intrahepatic ducts. No filling defect. Prompt excretion into the duodenum is noted.  IMPRESSION: Expected appearance of intraoperative cholangiogram.   Electronically Signed   By: Conchita Paris M.D.   On: 03/29/2014 12:44    Anti-infectives: Anti-infectives    Start     Dose/Rate Route Frequency Ordered Stop   03/29/14 1500  imipenem-cilastatin (PRIMAXIN) 500 mg in sodium chloride 0.9 % 100 mL IVPB     500 mg200 mL/hr over 30 Minutes Intravenous Every 6 hours 03/29/14 1400     03/28/14 1800  imipenem-cilastatin (PRIMAXIN) 500 mg in sodium chloride 0.9 % 100 mL IVPB  Status:  Discontinued  500 mg200 mL/hr over 30 Minutes Intravenous Every 8 hours 03/28/14 1625 03/29/14 1400   03/27/14 0800  piperacillin-tazobactam (ZOSYN) IVPB 3.375 g  Status:  Discontinued     3.375 g12.5 mL/hr over 240 Minutes Intravenous Every 8 hours 03/27/14 0727 03/28/14 1622       Assessment/Plan  1. POD 2, s/p lap chole for gallstone pancreatitis  Plan: 1. Patient continues to spike fevers to 102.6.  Unknown cause.  CT scan negative, UA negative, CXR negative, blood cultures negative.  The fevers were starting to spike pre-operative as well.  Sometimes, this is the body's response to stress.  Spoke to Dr.  Tana Coast, may stop abx therapy today or tomorrow after speaking with ID.  No clear source we are treating.  I agree with this.  Continue to follow.   LOS: 6 days    Naureen Benton E 03/31/2014, 10:07 AM Pager: [phone]

## 2014-04-01 DIAGNOSIS — R0602 Shortness of breath: Secondary | ICD-10-CM

## 2014-04-01 DIAGNOSIS — R509 Fever, unspecified: Secondary | ICD-10-CM | POA: Diagnosis not present

## 2014-04-01 DIAGNOSIS — E663 Overweight: Secondary | ICD-10-CM

## 2014-04-01 LAB — CBC
HCT: 29.4 % — ABNORMAL LOW (ref 39.0–52.0)
Hemoglobin: 10.2 g/dL — ABNORMAL LOW (ref 13.0–17.0)
MCH: 32.1 pg (ref 26.0–34.0)
MCHC: 34.7 g/dL (ref 30.0–36.0)
MCV: 92.5 fL (ref 78.0–100.0)
Platelets: 225 10*3/uL (ref 150–400)
RBC: 3.18 MIL/uL — ABNORMAL LOW (ref 4.22–5.81)
RDW: 14 % (ref 11.5–15.5)
WBC: 10.5 10*3/uL (ref 4.0–10.5)

## 2014-04-01 MED ORDER — AMOXICILLIN-POT CLAVULANATE 875-125 MG PO TABS
1.0000 | ORAL_TABLET | Freq: Two times a day (BID) | ORAL | Status: DC
  Administered 2014-04-01 – 2014-04-02 (×2): 1 via ORAL
  Filled 2014-04-01 (×3): qty 1

## 2014-04-01 MED ORDER — HYDROCODONE-ACETAMINOPHEN 5-325 MG PO TABS: 1.0000 | ORAL_TABLET | ORAL | Status: DC | PRN

## 2014-04-01 NOTE — Progress Notes (Signed)
Patient ID: Mason [name], male   DOB: [birth year], 68 y.o.   MRN: [id number] 3 Days Post-Op  Subjective: Pt feels well today.  No complaints.  Tolerating solid diet  Objective: Vital signs in last 24 hours: Temp:  [98.3 F (36.8 C)-101.8 F (38.8 C)] 100.1 F (37.8 C) (11/11 0937) Pulse Rate:  [76-91] 87 (11/11 0937) Resp:  [18-20] 18 (11/11 0937) BP: (118-135)/(63-80) 118/64 mmHg (11/11 0937) SpO2:  [96 %-97 %] 96 % (11/11 0937) Weight:  [207 lb (93.895 kg)-210 lb (95.255 kg)] 207 lb (93.895 kg) (11/11 0500) Last BM Date: 04/01/14  Intake/Output from previous day: 11/10 0701 - 11/11 0700 In: 360 [P.O.:360] Out: -  Intake/Output this shift: Total I/O In: 322 [P.O.:322] Out: -   PE: Abd: soft, minimally tender, +BS, ND, incisions c/d/i  Lab Results:   Recent Labs  03/30/14 0624 03/31/14 0911  WBC 10.5 11.3*  HGB 10.6* 11.0*  HCT 31.2* 32.0*  PLT 156 182   BMET  Recent Labs  03/30/14 0624 03/31/14 0911  NA 145 145  K 4.0 3.6*  CL 115* 112  CO2 20 23  GLUCOSE 105* 132*  BUN 17 17  CREATININE 1.13 1.16  CALCIUM 7.5* 7.9*   PT/INR No results for input(s): LABPROT, INR in the last 72 hours. CMP     Component Value Date/Time   NA 145 03/31/2014 0911   K 3.6* 03/31/2014 0911   CL 112 03/31/2014 0911   CO2 23 03/31/2014 0911   GLUCOSE 132* 03/31/2014 0911   BUN 17 03/31/2014 0911   CREATININE 1.16 03/31/2014 0911   CALCIUM 7.9* 03/31/2014 0911   PROT 6.0 03/31/2014 0911   ALBUMIN 2.4* 03/31/2014 0911   AST 72* 03/31/2014 0911   ALT 58* 03/31/2014 0911   ALKPHOS 73 03/31/2014 0911   BILITOT 1.2 03/31/2014 0911   GFRNONAA 63* 03/31/2014 0911   GFRAA 73* 03/31/2014 0911   Lipase     Component Value Date/Time   LIPASE 20 03/30/2014 0624       Studies/Results: Dg Chest 2 View  03/31/2014   CLINICAL DATA:  Postop fever following cholecystectomy.  EXAM: CHEST  2 VIEW  COMPARISON:  07/16/2013  FINDINGS: Bibasilar atelectasis, similar to prior  study. Suspect small effusions, also stable. Heart is upper limits normal in size. Mediastinal contours are within normal limits. No acute bony abnormality.  IMPRESSION: Stable bibasilar opacities, likely atelectasis with small effusions.   Electronically Signed   By: Rolm Baptise M.D.   On: 03/31/2014 09:57    Anti-infectives: Anti-infectives    Start     Dose/Rate Route Frequency Ordered Stop   03/29/14 1500  imipenem-cilastatin (PRIMAXIN) 500 mg in sodium chloride 0.9 % 100 mL IVPB  Status:  Discontinued     500 mg200 mL/hr over 30 Minutes Intravenous Every 6 hours 03/29/14 1400 03/31/14 1133   03/28/14 1800  imipenem-cilastatin (PRIMAXIN) 500 mg in sodium chloride 0.9 % 100 mL IVPB  Status:  Discontinued     500 mg200 mL/hr over 30 Minutes Intravenous Every 8 hours 03/28/14 1625 03/29/14 1400   03/27/14 0800  piperacillin-tazobactam (ZOSYN) IVPB 3.375 g  Status:  Discontinued     3.375 g12.5 mL/hr over 240 Minutes Intravenous Every 8 hours 03/27/14 0727 03/28/14 1622       Assessment/Plan  1. POD 3, s/p lap chole 2. Fevers of unknown origin  Plan: 1. Patient doing well surgically.  He does continue to run fevers, although the curve is down slightly.  No obvious source.  Medicine may decide to do LE dopplers just to rule out DVTs.  Otherwise, the patient is surgically stable from our standpoint for dc home, when medically stable. 2. Follow has been arranged.   LOS: 7 days    Margean Korell E 04/01/2014, 10:18 AM Pager: [phone]

## 2014-04-01 NOTE — Discharge Summary (Addendum)
Discharge Summary  Addendum: Given fevers continuing, surgery and if we'll continue to keep patient in hospital. Have started Augmentin because of reports of ear pain which may be otitis media which may explain fevers. Continue to monitor.  Mason [name] FWY:[id number] DOB: June 22, 1945  PCP: No PCP Per Patient  Admit date: 03/25/2014 Discharge date: 04/01/2014  Time spent: 25 minutes  Recommendations for Outpatient Follow-up:  1. Patient has scheduled appointment with [facility] on 12/1 at 3 PM for follow-up 2. New medication:Vicodin 5/325 by mouth every 6 hours when necessary total #20  Discharge Diagnoses:  Active Hospital Problems   Diagnosis Date Noted  . Pancreatitis, gallstone   . Fever 04/01/2014  . Cholelithiasis 03/26/2014  . History of alcohol abuse 03/26/2014  . Constipation 07/11/2013  . Overweight (BMI 25.0-29.9) 07/08/2013    Resolved Hospital Problems   Diagnosis Date Noted Date Resolved  No resolved problems to display.    Discharge Condition: improved, being discharged home  Diet recommendation: low-fat  Filed Weights   03/31/14 0500 03/31/14 2031 04/01/14 0500  Weight: 96.616 kg (213 lb) 95.255 kg (210 lb) 93.895 kg (207 lb)    History of present illness:  68 year old male with past medical history of occasional heavy alcohol use admitted on 11/54 several days of nausea vomiting and upper abdominal pain found to be in acute pancreatitis with a lipase of greater than 3000 and an abdominal ultrasound noting multiple gallstones.  Hospital Course:  Principal Problem:   Pancreatitis, gallstone: General surgery consult to. Patient treated with nothing by mouth and IV fluids and pain and nausea control. Patient underwent successful laparoscopic cholecystectomy on 11/8 without incident. Since that time he has continued to do well, with diet able to be advanced Active Problems:   Overweight (BMI 25.0-29.9): Patient meets criteria with BMI greater  than 25   Constipation: Patient reported some abdominal distention treated with Colace and Maalox and improved with several large bowel movements   Cholelithiasis: As above   History of occasional alcohol use: Stable no signs of alcohol withdrawal during this hospitalization   Fever: Following surgery, patient started having fevers as high as 102.6 starting the evening of 11/7.  Patient underwent workup including chest x-ray and urinalysis which were unremarkable. Repeat CT of the abdomen and pelvis noted no signs of abscess or infection, no signs of colitis. It was suspected that likely his temperature was from pancreatitis and surgery. Patient underwent lower extremity Dopplers prior to discharge which are pending   Procedures:  Lower extremity Dopplers done 11/11: results pending  Status post laparoscopic cholecystectomy done 11/8  Consultations:  General surgery  Discharge Exam: BP 118/64 mmHg  Pulse 87  Temp(Src) 100.1 F (37.8 C) (Oral)  Resp 18  Ht 6' (1.829 m)  Wt 93.895 kg (207 lb)  BMI 28.07 kg/m2  SpO2 96%  General: alert and oriented 3, no acute distress Cardiovascular: regular rate and rhythm, S1-S2 Respiratory: clear to auscultation bilaterally Abdomen: Soft, nontender, nondistended, positive bowel sounds  Discharge Instructions You were cared for by a hospitalist during your hospital stay. If you have any questions about your discharge medications or the care you received while you were in the hospital after you are discharged, you can call the unit and asked to speak with the hospitalist on call if the hospitalist that took care of you is not available. Once you are discharged, your primary care physician will handle any further medical issues. Please note that NO REFILLS for any discharge medications  will be authorized once you are discharged, as it is imperative that you return to your primary care physician (or establish a relationship with a primary care  physician if you do not have one) for your aftercare needs so that they can reassess your need for medications and monitor your lab values.     Medication List    TAKE these medications        albuterol 108 (90 BASE) MCG/ACT inhaler  Commonly known as:  PROVENTIL HFA;VENTOLIN HFA  Inhale 1-2 puffs into the lungs every 6 (six) hours as needed for wheezing or shortness of breath.     aspirin 500 MG tablet  Take 1,000 mg by mouth every 6 (six) hours as needed for pain.     fluticasone 220 MCG/ACT inhaler  Commonly known as:  FLOVENT HFA  Inhale 1 puff into the lungs 2 (two) times daily.     HYDROcodone-acetaminophen 5-325 MG per tablet  Commonly known as:  NORCO/VICODIN  Take 1 tablet by mouth every 4 (four) hours as needed for moderate pain.     ibuprofen 200 MG tablet  Commonly known as:  ADVIL,MOTRIN  Take 400 mg by mouth every 6 (six) hours as needed for moderate pain.       No Known Allergies     Follow-up Information    Follow up with [facility] On 04/21/2014.   Why:  3:30pm, arrive no later than 3:00pm for paperwork   Contact information:   [address] [place]   [place] [address] [phone]        The results of significant diagnostics from this hospitalization (including imaging, microbiology, ancillary and laboratory) are listed below for reference.    Significant Diagnostic Studies: Dg Chest 2 View  03/31/2014   CLINICAL DATA:  Postop fever following cholecystectomy.  EXAM: CHEST  2 VIEW  COMPARISON:  07/16/2013  FINDINGS: Bibasilar atelectasis, similar to prior study. Suspect small effusions, also stable. Heart is upper limits normal in size. Mediastinal contours are within normal limits. No acute bony abnormality.  IMPRESSION: Stable bibasilar opacities, likely atelectasis with small effusions.   Electronically Signed   By: Rolm Baptise M.D.   On: 03/31/2014 09:57   Dg Cholangiogram Operative  03/29/2014   CLINICAL DATA:   Cholecystitis, laparoscopic cholecystectomy  EXAM: INTRAOPERATIVE CHOLANGIOGRAM  TECHNIQUE: Cholangiographic images from the C-arm fluoroscopic device were submitted for interpretation post-operatively. Please see the procedural report for the amount of contrast and the fluoroscopy time utilized.  COMPARISON:  CT 03/28/2014  FINDINGS: Cholecystectomy clips are noted. Normal caliber common and intrahepatic ducts. No filling defect. Prompt excretion into the duodenum is noted.  IMPRESSION: Expected appearance of intraoperative cholangiogram.   Electronically Signed   By: Conchita Paris M.D.   On: 03/29/2014 12:44   Ct Abdomen Pelvis W Contrast  03/28/2014   CLINICAL DATA:  Pt c/o upper abd pain x 4 days, fever, nausea and vomiting red blood. Hx of pancreatitis in Feb. No hx of CA. No hx of sx  EXAM: CT ABDOMEN AND PELVIS WITH CONTRAST  TECHNIQUE: Multidetector CT imaging of the abdomen and pelvis was performed using the standard protocol following bolus administration of intravenous contrast.  CONTRAST:  140mL OMNIPAQUE IOHEXOL 300 MG/ML  SOLN  COMPARISON:  07/11/2013  FINDINGS: There are changes of acute pancreatitis. The pancreas is heterogeneous with surrounding fluid and fat stranding. Enhancement is seen along the entirety the pancreas with some relative decreased enhancement along  the periphery of the tail. Portal vein, superior mesenteric vein and splenic veins are patent. No discrete fluid collection is seen to suggest an abscess or pseudocyst.  Small bilateral effusions. There is bilateral lung base atelectasis mostly in the dependent lower lobes.  Mild diffuse fatty infiltration of the liver. No liver mass or focal lesion. Normal spleen. Normal gallbladder. No bile duct dilation. No adrenal masses.  14 mm posterior midpole left renal cyst. Kidneys otherwise unremarkable. Normal ureters and bladder.  There are prominent nodes in the peripancreatic chain and along the gastrohepatic ligament. Several  prominent left retroperitoneal lymph nodes are seen. There are no pathologically enlarged nodes.  Trace ascites collects in the posterior pelvic recess and tracks along the anterior perirenal fascia and pericolic gutters.  There are several colonic diverticula. No diverticulitis. No bowel inflammatory change. Normal appendix visualized.  No osteoblastic or osteolytic lesions.  IMPRESSION: 1. Acute pancreatitis. No convincing pancreatic necrosis. No evidence of an abscess or pseudocyst. No venous thrombosis. Trace ascites. 2. No other acute findings. 3. Small bilateral effusions.  Lung base atelectasis. 4. Mild diffuse hepatic steatosis.   Electronically Signed   By: Lajean Manes M.D.   On: 03/28/2014 19:57   US Abdomen Limited  03/26/2014   CLINICAL DATA:  RIGHT upper quadrant pain.  History of pancreatitis.  EXAM: US ABDOMEN LIMITED - RIGHT UPPER QUADRANT  COMPARISON:  CT of the abdomen and pelvis July 19, 2013  FINDINGS: Gallbladder:  Multiple echogenic mobile gallstones measure up to at least 7 mm. No gallbladder wall thickening, pericholecystic fluid. No sonographic Murphy's sign was elicited.  Common bile duct:  Diameter: 5 mm  Liver:  No focal lesion identified. Within normal limits in parenchymal echogenicity. Hepatopetal portal vein.  Included view of the pancreas demonstrates ductal dilatation at 4 mm.  IMPRESSION: Cholelithiasis without sonographic findings of acute cholecystitis.  Mild pancreatic duct dilatation can be seen with acute or chronic pancreatitis.   Electronically Signed   By: Elon Alas   On: 03/26/2014 01:46    Microbiology: Recent Results (from the past 240 hour(s))  Culture, blood (routine x 2)     Status: None (Preliminary result)   Collection Time: 03/27/14  9:10 AM  Result Value Ref Range Status   Specimen Description BLOOD RIGHT ANTECUBITAL  Final   Special Requests BOTTLES DRAWN AEROBIC AND ANAEROBIC 10CC  Final   Culture  Setup Time   Final    03/27/2014  15:41 Performed at [facility]    Culture   Final           BLOOD CULTURE RECEIVED NO GROWTH TO DATE CULTURE WILL BE HELD FOR 5 DAYS BEFORE ISSUING A FINAL NEGATIVE REPORT Performed at [facility]    Report Status PENDING  Incomplete  Culture, blood (routine x 2)     Status: None (Preliminary result)   Collection Time: 03/27/14  9:16 AM  Result Value Ref Range Status   Specimen Description BLOOD RIGHT HAND  Final   Special Requests BOTTLES DRAWN AEROBIC AND ANAEROBIC 10CC  Final   Culture  Setup Time   Final    03/27/2014 15:39 Performed at [facility]    Culture   Final           BLOOD CULTURE RECEIVED NO GROWTH TO DATE CULTURE WILL BE HELD FOR 5 DAYS BEFORE ISSUING A FINAL NEGATIVE REPORT Performed at [facility]    Report Status PENDING  Incomplete  Surgical pcr screen  Status: None   Collection Time: 03/29/14 10:51 AM  Result Value Ref Range Status   MRSA, PCR NEGATIVE NEGATIVE Final   Staphylococcus aureus NEGATIVE NEGATIVE Final    Comment:        The Xpert SA Assay (FDA approved for NASAL specimens in patients over 71 years of age), is one component of a comprehensive surveillance program.  Test performance has been validated by [employer] for patients greater than or equal to 47 year old. It is not intended to diagnose infection nor to guide or monitor treatment.   Clostridium Difficile by PCR     Status: None   Collection Time: 03/31/14  9:33 AM  Result Value Ref Range Status   C difficile by pcr NEGATIVE NEGATIVE Final     Labs: Basic Metabolic Panel:  Recent Labs Lab 03/27/14 0500 03/28/14 0510 03/29/14 0745 03/30/14 0624 03/31/14 0911  NA 144 142 145 145 145  K 3.8 3.7 3.3* 4.0 3.6*  CL 108 109 112 115* 112  CO2 20 17* 18* 20 23  GLUCOSE 74 62* 70 105* 132*  BUN 20 19 18 17 17   CREATININE 1.26 1.31 1.05 1.13 1.16  CALCIUM 7.7* 7.8* 7.5* 7.5* 7.9*   Liver Function Tests:  Recent Labs Lab  03/27/14 0500 03/28/14 0510 03/29/14 0745 03/30/14 0624 03/31/14 0911  AST 47* 37 47* 78* 72*  ALT 47 33 30 46 58*  ALKPHOS 59 62 59 71 73  BILITOT 1.6* 1.9* 1.8* 1.2 1.2  PROT 6.1 6.0 5.7* 5.5* 6.0  ALBUMIN 3.0* 2.5* 2.4* 2.0* 2.4*    Recent Labs Lab 03/26/14 0746 03/27/14 0500 03/28/14 0510 03/29/14 0745 03/30/14 0624  LIPASE 1133* 509* 77* 21 20   No results for input(s): AMMONIA in the last 168 hours. CBC:  Recent Labs Lab 03/25/14 2246 03/26/14 0746  03/28/14 0510 03/29/14 0745 03/30/14 0624 03/31/14 0911 04/01/14 1605  WBC 6.4 10.9*  < > 11.9* 9.9 10.5 11.3* 10.5  NEUTROABS 3.9 10.2*  --   --   --   --   --   --   HGB 14.7 15.8  < > 13.2 11.0* 10.6* 11.0* 10.2*  HCT 42.3 45.1  < > 38.2* 32.9* 31.2* 32.0* 29.4*  MCV 92.4 92.8  < > 93.6 92.2 94.3 94.1 92.5  PLT 183 163  < > 128* 151 156 182 225  < > = values in this interval not displayed. Cardiac Enzymes:  Recent Labs Lab 03/25/14 2246  TROPONINI <0.30   BNP: BNP (last 3 results)  Recent Labs  07/10/13 0903  PROBNP 208.5*   CBG: No results for input(s): GLUCAP in the last 168 hours.     Signed:  Annita Brod  [facility] 04/01/2014, 4:32 PM  Addendum This chart is addended per patient's request which is approved, patient drinks only occasionally/socially and does not have history of alcohol abuse   Jasan Doughtie M.D. [facility] 06/03/2014, 5:25 PM  Pager: [phone]

## 2014-04-01 NOTE — Progress Notes (Signed)
VASCULAR LAB PRELIMINARY  PRELIMINARY  PRELIMINARY  PRELIMINARY  Bilateral lower extremity venous duplex completed.    Preliminary report:  Bilateral lower extremity venous duplex  Dorothye Berni, RVS 04/01/2014, 5:34 PM

## 2014-04-01 NOTE — Progress Notes (Addendum)
Event:  Notified by RN that pt and his wife requesting someone come to examine pt's (R) ear. Pt has reported (R) ear pain x 24 hours. Subjective/Objective: Mason [name] is a 68 year old male who presented to ED on 03/26/2014 w/ c/o upper abdominal pain, n/v. Patient was found to have acute pancreatitis with lipase over 3000. Abd u/s showed multiple gallstones.Gen. Surgery consulted, patient underwent laparoscopic cholecystectomy on 11/8. Pt spiked a fever on post-op day one w/ unclear etiology. U/A on admission was neg, blood cultures have been neg and CXR was w/o acute findings on the morning of 03/31/2014.  At bedside pt noted resting in NAD. He reports intermittent (R) ear pain x 24 hrs. Initially denied any recent URI infections but did admit to increased cough recently and increased nasal congestion. On exam pt noted w/ bil ROM and LOM w/ effusion.  Assessment/Plan: 1. ROM and LOM w/ effusion: Given fevers will start Augmentin 875/125 mg q12h and continue after discharge for a total of 10 days. Discussed w/ pt who is agreeable w/ plan.  Jeryl Columbia, NP-C [facility] Pager [phone]

## 2014-04-01 NOTE — Discharge Instructions (Signed)
[facility] ______[facility] °LAPAROSCOPIC SURGERY: POST OP INSTRUCTIONS °Always review your discharge instruction sheet given to you by the facility where your surgery was performed. °IF YOU HAVE DISABILITY OR FAMILY LEAVE FORMS, YOU MUST BRING THEM TO THE OFFICE FOR PROCESSING.   °DO NOT GIVE THEM TO YOUR DOCTOR. ° °1. A prescription for pain medication may be given to you upon discharge.  Take your pain medication as prescribed, if needed.  If narcotic pain medicine is not needed, then you may take acetaminophen (Tylenol) or ibuprofen (Advil) as needed. °2. Take your usually prescribed medications unless otherwise directed. °3. If you need a refill on your pain medication, please contact your pharmacy.  They will contact our office to request authorization. Prescriptions will not be filled after 5pm or on week-ends. °4. You should follow a light diet the first few days after arrival home, such as soup and crackers, etc.  Be sure to include lots of fluids daily. °5. Most patients will experience some swelling and bruising in the area of the incisions.  Ice packs will help.  Swelling and bruising can take several days to resolve.  °6. It is common to experience some constipation if taking pain medication after surgery.  Increasing fluid intake and taking a stool softener (such as Colace) will usually help or prevent this problem from occurring.  A mild laxative (Milk of Magnesia or Miralax) should be taken according to package instructions if there are no bowel movements after 48 hours. °7. Unless discharge instructions indicate otherwise, you may remove your bandages 24-48 hours after surgery, and you may shower at that time.  You may have steri-strips (small skin tapes) in place directly over the incision.  These strips should be left on the skin for 7-10 days.  If your surgeon used skin glue on the incision, you may shower in 24 hours.  The glue will flake off over the next 2-3 weeks.  Any sutures or  staples will be removed at the office during your follow-up visit. °8. ACTIVITIES:  You may resume regular (light) daily activities beginning the next day--such as daily self-care, walking, climbing stairs--gradually increasing activities as tolerated.  You may have sexual intercourse when it is comfortable.  Refrain from any heavy lifting or straining until approved by your doctor. °a. You may drive when you are no longer taking prescription pain medication, you can comfortably wear a seatbelt, and you can safely maneuver your car and apply brakes. °b. RETURN TO WORK:  __________________________________________________________ °9. You should see your doctor in the office for a follow-up appointment approximately 2-3 weeks after your surgery.  Make sure that you call for this appointment within a day or two after you arrive home to insure a convenient appointment time. °10. OTHER INSTRUCTIONS: __________________________________________________________________________________________________________________________ __________________________________________________________________________________________________________________________ °WHEN TO CALL YOUR DOCTOR: °1. Fever over 101.0 °2. Inability to urinate °3. Continued bleeding from incision. °4. Increased pain, redness, or drainage from the incision. °5. Increasing abdominal pain ° °The clinic staff is available to answer your questions during regular business hours.  Please don’t hesitate to call and ask to speak to one of the nurses for clinical concerns.  If you have a medical emergency, go to the nearest emergency room or call 911.  A surgeon from [facility] is always on call at the hospital. °[address], [address], [place], [place]  [address] ? [address] [address], [place], [place]   [address] °[phone] ? [phone] ? FAX [phone] °Web site:   [email or web address] °

## 2014-04-02 DIAGNOSIS — H6691 Otitis media, unspecified, right ear: Secondary | ICD-10-CM | POA: Clinically undetermined

## 2014-04-02 DIAGNOSIS — H65191 Other acute nonsuppurative otitis media, right ear: Secondary | ICD-10-CM

## 2014-04-02 LAB — COMPREHENSIVE METABOLIC PANEL
ALT: 42 U/L (ref 0–53)
AST: 38 U/L — ABNORMAL HIGH (ref 0–37)
Albumin: 2.3 g/dL — ABNORMAL LOW (ref 3.5–5.2)
Alkaline Phosphatase: 66 U/L (ref 39–117)
Anion gap: 11 (ref 5–15)
BUN: 12 mg/dL (ref 6–23)
CO2: 25 mEq/L (ref 19–32)
Calcium: 8.1 mg/dL — ABNORMAL LOW (ref 8.4–10.5)
Chloride: 104 mEq/L (ref 96–112)
Creatinine, Ser: 1.04 mg/dL (ref 0.50–1.35)
GFR calc Af Amer: 84 mL/min — ABNORMAL LOW (ref >90)
GFR calc non Af Amer: 72 mL/min — ABNORMAL LOW (ref >90)
Glucose, Bld: 113 mg/dL — ABNORMAL HIGH (ref 70–99)
Potassium: 3 mEq/L — ABNORMAL LOW (ref 3.7–5.3)
Sodium: 140 mEq/L (ref 137–147)
Total Bilirubin: 0.9 mg/dL (ref 0.3–1.2)
Total Protein: 6.1 g/dL (ref 6.0–8.3)

## 2014-04-02 LAB — CULTURE, BLOOD (ROUTINE X 2)
Culture: NO GROWTH
Culture: NO GROWTH

## 2014-04-02 LAB — LIPASE, BLOOD: Lipase: 77 U/L — ABNORMAL HIGH (ref 11–59)

## 2014-04-02 MED ORDER — AMOXICILLIN-POT CLAVULANATE 875-125 MG PO TABS: 1.0000 | ORAL_TABLET | Freq: Two times a day (BID) | ORAL | Status: AC

## 2014-04-02 NOTE — Progress Notes (Signed)
Pt discharge instructions and prescription given, pt verbalized understanding. VSS.  Denies pain at this time. Temp 98.2.  Pt left floor ambulating accompanied by staff and family.

## 2014-04-02 NOTE — Progress Notes (Signed)
4 Days Post-Op  Subjective: Had another temp of 102 around 1700. Duplex by report negative. Concern for ear infection. Pt denies n/v/abd pain. Tolerated diet.   Objective: Vital signs in last 24 hours: Temp:  [98.9 F (37.2 C)-102.1 F (38.9 C)] 98.9 F (37.2 C) (11/12 0535) Pulse Rate:  [78-93] 78 (11/12 0535) Resp:  [16-18] 16 (11/12 0535) BP: (118-140)/(64-73) 119/66 mmHg (11/12 0535) SpO2:  [95 %-97 %] 95 % (11/12 0535) Weight:  [207 lb (93.895 kg)] 207 lb (93.895 kg) (11/11 2112) Last BM Date: 04/01/14  Intake/Output from previous day: 11/11 0701 - 11/12 0700 In: 322 [P.O.:322] Out: -  Intake/Output this shift:    Alert, nad, nontoxic, not ill appearing Soft, nt, nd, incisions c/d/i No edema Reg rate  Lab Results:   Recent Labs  03/31/14 0911 04/01/14 1605  WBC 11.3* 10.5  HGB 11.0* 10.2*  HCT 32.0* 29.4*  PLT 182 225   BMET  Recent Labs  03/31/14 0911  NA 145  K 3.6*  CL 112  CO2 23  GLUCOSE 132*  BUN 17  CREATININE 1.16  CALCIUM 7.9*   PT/INR No results for input(s): LABPROT, INR in the last 72 hours. ABG No results for input(s): PHART, HCO3 in the last 72 hours.  Invalid input(s): PCO2, PO2  Studies/Results: Dg Chest 2 View  03/31/2014   CLINICAL DATA:  Postop fever following cholecystectomy.  EXAM: CHEST  2 VIEW  COMPARISON:  07/16/2013  FINDINGS: Bibasilar atelectasis, similar to prior study. Suspect small effusions, also stable. Heart is upper limits normal in size. Mediastinal contours are within normal limits. No acute bony abnormality.  IMPRESSION: Stable bibasilar opacities, likely atelectasis with small effusions.   Electronically Signed   By: Rolm Baptise M.D.   On: 03/31/2014 09:57    Anti-infectives: Anti-infectives    Start     Dose/Rate Route Frequency Ordered Stop   04/01/14 2245  amoxicillin-clavulanate (AUGMENTIN) 875-125 MG per tablet 1 tablet     1 tablet Oral Every 12 hours 04/01/14 2227     03/29/14 1500   imipenem-cilastatin (PRIMAXIN) 500 mg in sodium chloride 0.9 % 100 mL IVPB  Status:  Discontinued     500 mg200 mL/hr over 30 Minutes Intravenous Every 6 hours 03/29/14 1400 03/31/14 1133   03/28/14 1800  imipenem-cilastatin (PRIMAXIN) 500 mg in sodium chloride 0.9 % 100 mL IVPB  Status:  Discontinued     500 mg200 mL/hr over 30 Minutes Intravenous Every 8 hours 03/28/14 1625 03/29/14 1400   03/27/14 0800  piperacillin-tazobactam (ZOSYN) IVPB 3.375 g  Status:  Discontinued     3.375 g12.5 mL/hr over 240 Minutes Intravenous Every 8 hours 03/27/14 0727 03/28/14 1622      Assessment/Plan: s/p Procedure(s): LAPAROSCOPIC CHOLECYSTECTOMY WITH INTRAOPERATIVE CHOLANGIOGRAM (N/A)  Still not sure what to make of fevers. No abd symptoms. Will check CMET this am to make sure LFTs normal - transaminases were slightly bumped after surgery which is expected due to trauma to liver when taking out GB.  If CMET looks good, I don't believe he needs any addl GI workup since he has no addl symptoms and will defer timing of discharge to [place]. Redmond Pulling, MD, FACS General, Bariatric, & Minimally Invasive Surgery [facility], [place]   LOS: 8 days    Mason [name] 04/02/2014

## 2014-04-02 NOTE — Progress Notes (Signed)
Utilization review completed.  

## 2014-04-02 NOTE — Discharge Summary (Addendum)
Discharge Summary    Mason [name] YYT:[id number] DOB: 17-Feb-1946  PCP: No PCP Per Patient  Admit date: 03/25/2014 Discharge date: 04/02/2014  Time spent: 25 minutes  Recommendations for Outpatient Follow-up:  1. Patient has scheduled appointment with [facility] on 12/1 at 3 PM for follow-up 2. New medication:Vicodin 5/325 by mouth every 6 hours when necessary total #20 3. New Medication: Augmentin 875/125 mg po bid x 5 days  Discharge Diagnoses:  Active Hospital Problems   Diagnosis Date Noted  . Pancreatitis, gallstone   . Fever 04/01/2014  . Cholelithiasis 03/26/2014  . History of alcohol abuse 03/26/2014  . Constipation 07/11/2013  . Overweight (BMI 25.0-29.9) 07/08/2013  Right sided Otitis Media  Resolved Hospital Problems   Diagnosis Date Noted Date Resolved  No resolved problems to display.    Discharge Condition: improved, being discharged home  Diet recommendation: low-fat  Filed Weights   03/31/14 2031 04/01/14 0500 04/01/14 2112  Weight: 95.255 kg (210 lb) 93.895 kg (207 lb) 93.895 kg (207 lb)    History of present illness:  68 year old male with past medical history of occasional heavy alcohol use admitted on 11/54 several days of nausea vomiting and upper abdominal pain found to be in acute pancreatitis with a lipase of greater than 3000 and an abdominal ultrasound noting multiple gallstones.  Hospital Course:  Principal Problem:   Pancreatitis, gallstone: General surgery consult to. Patient treated with nothing by mouth and IV fluids and pain and nausea control. Patient underwent successful laparoscopic cholecystectomy on 11/8 without incident. Since that time he has continued to do well, with diet able to be advanced  Active Problems:   Overweight (BMI 25.0-29.9): Patient meets criteria with BMI greater than 25    Constipation: Patient reported some abdominal distention treated with Colace and Maalox and improved with several large bowel  movements    Cholelithiasis: As above    History of occasional alcohol use: Stable no signs of alcohol withdrawal during this hospitalization   otitis media/ Fever: Following surgery, patient started having fevers as high as 102.6 starting the evening of 11/7.  Patient underwent workup including chest x-ray and urinalysis which were unremarkable. Repeat CT of the abdomen and pelvis noted no signs of abscess or infection, no signs of colitis. It was suspected that likely his temperature was from pancreatitis and surgery. Patient underwent lower extremity Dopplers on 11/11 which were normal. Patient was complaining of right-sided ear pain. Otoscope examination did note some bulging and inflammation of the right tympanic membrane. Started on Augmentin on 11/11 evening and patient has not had fever since   Procedures:  Lower extremity Dopplers done 11/11: results pending  Status post laparoscopic cholecystectomy done 11/8  Consultations:  General surgery  Discharge Exam: BP 123/66 mmHg  Pulse 91  Temp(Src) 99.4 F (37.4 C) (Oral)  Resp 20  Ht 6' (1.829 m)  Wt 93.895 kg (207 lb)  BMI 28.07 kg/m2  SpO2 92%  General: alert and oriented 3, no acute distress Cardiovascular: regular rate and rhythm, S1-S2 Respiratory: clear to auscultation bilaterally Abdomen: Soft, nontender, nondistended, positive bowel sounds  Discharge Instructions You were cared for by a hospitalist during your hospital stay. If you have any questions about your discharge medications or the care you received while you were in the hospital after you are discharged, you can call the unit and asked to speak with the hospitalist on call if the hospitalist that took care of you is not available. Once you are discharged, your  primary care physician will handle any further medical issues. Please note that NO REFILLS for any discharge medications will be authorized once you are discharged, as it is imperative that you return  to your primary care physician (or establish a relationship with a primary care physician if you do not have one) for your aftercare needs so that they can reassess your need for medications and monitor your lab values.     Medication List    TAKE these medications        albuterol 108 (90 BASE) MCG/ACT inhaler  Commonly known as:  PROVENTIL HFA;VENTOLIN HFA  Inhale 1-2 puffs into the lungs every 6 (six) hours as needed for wheezing or shortness of breath.     aspirin 500 MG tablet  Take 1,000 mg by mouth every 6 (six) hours as needed for pain.     fluticasone 220 MCG/ACT inhaler  Commonly known as:  FLOVENT HFA  Inhale 1 puff into the lungs 2 (two) times daily.     HYDROcodone-acetaminophen 5-325 MG per tablet  Commonly known as:  NORCO/VICODIN  Take 1 tablet by mouth every 4 (four) hours as needed for moderate pain.     ibuprofen 200 MG tablet  Commonly known as:  ADVIL,MOTRIN  Take 400 mg by mouth every 6 (six) hours as needed for moderate pain.       No Known Allergies Follow-up Information    Follow up with [facility] On 04/21/2014.   Why:  3:30pm, arrive no later than 3:00pm for paperwork   Contact information:   [address] [place]   [place] [address] [phone]        The results of significant diagnostics from this hospitalization (including imaging, microbiology, ancillary and laboratory) are listed below for reference.    Significant Diagnostic Studies: Dg Chest 2 View  03/31/2014   CLINICAL DATA:  Postop fever following cholecystectomy.  EXAM: CHEST  2 VIEW  COMPARISON:  07/16/2013  FINDINGS: Bibasilar atelectasis, similar to prior study. Suspect small effusions, also stable. Heart is upper limits normal in size. Mediastinal contours are within normal limits. No acute bony abnormality.  IMPRESSION: Stable bibasilar opacities, likely atelectasis with small effusions.   Electronically Signed   By: Rolm Baptise M.D.   On: 03/31/2014  09:57   Dg Cholangiogram Operative  03/29/2014   CLINICAL DATA:  Cholecystitis, laparoscopic cholecystectomy  EXAM: INTRAOPERATIVE CHOLANGIOGRAM  TECHNIQUE: Cholangiographic images from the C-arm fluoroscopic device were submitted for interpretation post-operatively. Please see the procedural report for the amount of contrast and the fluoroscopy time utilized.  COMPARISON:  CT 03/28/2014  FINDINGS: Cholecystectomy clips are noted. Normal caliber common and intrahepatic ducts. No filling defect. Prompt excretion into the duodenum is noted.  IMPRESSION: Expected appearance of intraoperative cholangiogram.   Electronically Signed   By: Conchita Paris M.D.   On: 03/29/2014 12:44   Ct Abdomen Pelvis W Contrast  03/28/2014   CLINICAL DATA:  Pt c/o upper abd pain x 4 days, fever, nausea and vomiting red blood. Hx of pancreatitis in Feb. No hx of CA. No hx of sx  EXAM: CT ABDOMEN AND PELVIS WITH CONTRAST  TECHNIQUE: Multidetector CT imaging of the abdomen and pelvis was performed using the standard protocol following bolus administration of intravenous contrast.  CONTRAST:  151mL OMNIPAQUE IOHEXOL 300 MG/ML  SOLN  COMPARISON:  07/11/2013  FINDINGS: There are changes of acute pancreatitis. The pancreas is heterogeneous with surrounding fluid and fat stranding.  Enhancement is seen along the entirety the pancreas with some relative decreased enhancement along the periphery of the tail. Portal vein, superior mesenteric vein and splenic veins are patent. No discrete fluid collection is seen to suggest an abscess or pseudocyst.  Small bilateral effusions. There is bilateral lung base atelectasis mostly in the dependent lower lobes.  Mild diffuse fatty infiltration of the liver. No liver mass or focal lesion. Normal spleen. Normal gallbladder. No bile duct dilation. No adrenal masses.  14 mm posterior midpole left renal cyst. Kidneys otherwise unremarkable. Normal ureters and bladder.  There are prominent nodes in the  peripancreatic chain and along the gastrohepatic ligament. Several prominent left retroperitoneal lymph nodes are seen. There are no pathologically enlarged nodes.  Trace ascites collects in the posterior pelvic recess and tracks along the anterior perirenal fascia and pericolic gutters.  There are several colonic diverticula. No diverticulitis. No bowel inflammatory change. Normal appendix visualized.  No osteoblastic or osteolytic lesions.  IMPRESSION: 1. Acute pancreatitis. No convincing pancreatic necrosis. No evidence of an abscess or pseudocyst. No venous thrombosis. Trace ascites. 2. No other acute findings. 3. Small bilateral effusions.  Lung base atelectasis. 4. Mild diffuse hepatic steatosis.   Electronically Signed   By: Lajean Manes M.D.   On: 03/28/2014 19:57   US Abdomen Limited  03/26/2014   CLINICAL DATA:  RIGHT upper quadrant pain.  History of pancreatitis.  EXAM: US ABDOMEN LIMITED - RIGHT UPPER QUADRANT  COMPARISON:  CT of the abdomen and pelvis July 19, 2013  FINDINGS: Gallbladder:  Multiple echogenic mobile gallstones measure up to at least 7 mm. No gallbladder wall thickening, pericholecystic fluid. No sonographic Murphy's sign was elicited.  Common bile duct:  Diameter: 5 mm  Liver:  No focal lesion identified. Within normal limits in parenchymal echogenicity. Hepatopetal portal vein.  Included view of the pancreas demonstrates ductal dilatation at 4 mm.  IMPRESSION: Cholelithiasis without sonographic findings of acute cholecystitis.  Mild pancreatic duct dilatation can be seen with acute or chronic pancreatitis.   Electronically Signed   By: Elon Alas   On: 03/26/2014 01:46    Microbiology: Recent Results (from the past 240 hour(s))  Culture, blood (routine x 2)     Status: None   Collection Time: 03/27/14  9:10 AM  Result Value Ref Range Status   Specimen Description BLOOD RIGHT ANTECUBITAL  Final   Special Requests BOTTLES DRAWN AEROBIC AND ANAEROBIC 10CC  Final    Culture  Setup Time   Final    03/27/2014 15:41 Performed at [facility]    Culture   Final    NO GROWTH 5 DAYS Performed at [facility]    Report Status 04/02/2014 FINAL  Final  Culture, blood (routine x 2)     Status: None   Collection Time: 03/27/14  9:16 AM  Result Value Ref Range Status   Specimen Description BLOOD RIGHT HAND  Final   Special Requests BOTTLES DRAWN AEROBIC AND ANAEROBIC 10CC  Final   Culture  Setup Time   Final    03/27/2014 15:39 Performed at [facility]   Final    NO GROWTH 5 DAYS Performed at [facility]    Report Status 04/02/2014 FINAL  Final  Surgical pcr screen     Status: None   Collection Time: 03/29/14 10:51 AM  Result Value Ref Range Status   MRSA, PCR NEGATIVE NEGATIVE Final   Staphylococcus aureus NEGATIVE NEGATIVE Final  Comment:        The Xpert SA Assay (FDA approved for NASAL specimens in patients over 67 years of age), is one component of a comprehensive surveillance program.  Test performance has been validated by [employer] for patients greater than or equal to 68 year old. It is not intended to diagnose infection nor to guide or monitor treatment.   Clostridium Difficile by PCR     Status: None   Collection Time: 03/31/14  9:33 AM  Result Value Ref Range Status   C difficile by pcr NEGATIVE NEGATIVE Final     Labs: Basic Metabolic Panel:  Recent Labs Lab 03/28/14 0510 03/29/14 0745 03/30/14 0624 03/31/14 0911 04/02/14 0854  NA 142 145 145 145 140  K 3.7 3.3* 4.0 3.6* 3.0*  CL 109 112 115* 112 104  CO2 17* 18* 20 23 25   GLUCOSE 62* 70 105* 132* 113*  BUN 19 18 17 17 12   CREATININE 1.31 1.05 1.13 1.16 1.04  CALCIUM 7.8* 7.5* 7.5* 7.9* 8.1*   Liver Function Tests:  Recent Labs Lab 03/28/14 0510 03/29/14 0745 03/30/14 0624 03/31/14 0911 04/02/14 0854  AST 37 47* 78* 72* 38*  ALT 33 30 46 58* 42  ALKPHOS 62 59 71 73 66  BILITOT 1.9* 1.8* 1.2 1.2 0.9    PROT 6.0 5.7* 5.5* 6.0 6.1  ALBUMIN 2.5* 2.4* 2.0* 2.4* 2.3*    Recent Labs Lab 03/27/14 0500 03/28/14 0510 03/29/14 0745 03/30/14 0624 04/02/14 0854  LIPASE 509* 77* 21 20 77*   No results for input(s): AMMONIA in the last 168 hours. CBC:  Recent Labs Lab 03/28/14 0510 03/29/14 0745 03/30/14 0624 03/31/14 0911 04/01/14 1605  WBC 11.9* 9.9 10.5 11.3* 10.5  HGB 13.2 11.0* 10.6* 11.0* 10.2*  HCT 38.2* 32.9* 31.2* 32.0* 29.4*  MCV 93.6 92.2 94.3 94.1 92.5  PLT 128* 151 156 182 225   Cardiac Enzymes: No results for input(s): CKTOTAL, CKMB, CKMBINDEX, TROPONINI in the last 168 hours. BNP: BNP (last 3 results)  Recent Labs  07/10/13 0903  PROBNP 208.5*   CBG: No results for input(s): GLUCAP in the last 168 hours.     Signed:  Annita Brod  [facility] 04/02/2014, 2:13 PM   This chart is addended per patient's request which is approved, patient drinks only occasionally/socially and does not have history of alcohol abuse  Quilla Freeze M.D. [facility] 06/03/2014, 5:26 PM  Pager: [phone]

## 2014-04-02 NOTE — Plan of Care (Signed)
Problem: Consults Goal: Skin Care Protocol Initiated - if Braden Score 18 or less If consults are not indicated, leave blank or document N/A  Outcome: Completed/Met Date Met:  04/02/14 Goal: Nutrition Consult-if indicated Outcome: Completed/Met Date Met:  04/02/14  Problem: Phase I Progression Outcomes Goal: Hemodynamically stable Outcome: Completed/Met Date Met:  04/02/14 Goal: Other Phase I Outcomes/Goals Outcome: Completed/Met Date Met:  04/02/14

## 2014-04-06 ENCOUNTER — Other Ambulatory Visit: Payer: Self-pay | Admitting: Family Medicine

## 2014-04-06 ENCOUNTER — Inpatient Hospital Stay: Admission: RE | Admit: 2014-04-06 | Payer: Medicare Other | Source: Ambulatory Visit

## 2014-04-06 DIAGNOSIS — R110 Nausea: Secondary | ICD-10-CM

## 2014-04-06 DIAGNOSIS — K859 Acute pancreatitis, unspecified: Secondary | ICD-10-CM | POA: Diagnosis not present

## 2014-04-06 DIAGNOSIS — K219 Gastro-esophageal reflux disease without esophagitis: Secondary | ICD-10-CM | POA: Diagnosis not present

## 2014-04-06 DIAGNOSIS — R5082 Postprocedural fever: Secondary | ICD-10-CM

## 2014-04-06 DIAGNOSIS — R509 Fever, unspecified: Secondary | ICD-10-CM | POA: Diagnosis not present

## 2014-04-06 DIAGNOSIS — Z09 Encounter for follow-up examination after completed treatment for conditions other than malignant neoplasm: Secondary | ICD-10-CM

## 2014-04-06 DIAGNOSIS — R066 Hiccough: Secondary | ICD-10-CM | POA: Diagnosis not present

## 2014-04-07 ENCOUNTER — Encounter (HOSPITAL_COMMUNITY): Payer: Self-pay | Admitting: Neurology

## 2014-04-07 ENCOUNTER — Inpatient Hospital Stay (HOSPITAL_COMMUNITY): Payer: Medicare Other

## 2014-04-07 ENCOUNTER — Emergency Department (HOSPITAL_COMMUNITY): Payer: Medicare Other

## 2014-04-07 ENCOUNTER — Inpatient Hospital Stay (HOSPITAL_COMMUNITY)
Admission: EM | Admit: 2014-04-07 | Discharge: 2014-04-19 | DRG: 871 | Disposition: A | Discharge status: Home Health | Payer: Medicare Other | Attending: Internal Medicine | Admitting: Internal Medicine

## 2014-04-07 DIAGNOSIS — K851 Biliary acute pancreatitis: Secondary | ICD-10-CM | POA: Diagnosis present

## 2014-04-07 DIAGNOSIS — R1084 Generalized abdominal pain: Secondary | ICD-10-CM | POA: Diagnosis not present

## 2014-04-07 DIAGNOSIS — K625 Hemorrhage of anus and rectum: Secondary | ICD-10-CM | POA: Diagnosis not present

## 2014-04-07 DIAGNOSIS — E440 Moderate protein-calorie malnutrition: Secondary | ICD-10-CM | POA: Insufficient documentation

## 2014-04-07 DIAGNOSIS — R109 Unspecified abdominal pain: Secondary | ICD-10-CM | POA: Diagnosis not present

## 2014-04-07 DIAGNOSIS — K922 Gastrointestinal hemorrhage, unspecified: Secondary | ICD-10-CM

## 2014-04-07 DIAGNOSIS — R918 Other nonspecific abnormal finding of lung field: Secondary | ICD-10-CM | POA: Diagnosis not present

## 2014-04-07 DIAGNOSIS — K858 Other acute pancreatitis without necrosis or infection: Secondary | ICD-10-CM

## 2014-04-07 DIAGNOSIS — K567 Ileus, unspecified: Secondary | ICD-10-CM | POA: Diagnosis present

## 2014-04-07 DIAGNOSIS — J189 Pneumonia, unspecified organism: Secondary | ICD-10-CM | POA: Diagnosis present

## 2014-04-07 DIAGNOSIS — Z9049 Acquired absence of other specified parts of digestive tract: Secondary | ICD-10-CM | POA: Diagnosis present

## 2014-04-07 DIAGNOSIS — K8591 Acute pancreatitis with uninfected necrosis, unspecified: Secondary | ICD-10-CM | POA: Diagnosis present

## 2014-04-07 DIAGNOSIS — Y95 Nosocomial condition: Secondary | ICD-10-CM | POA: Diagnosis present

## 2014-04-07 DIAGNOSIS — E876 Hypokalemia: Secondary | ICD-10-CM | POA: Diagnosis present

## 2014-04-07 DIAGNOSIS — R509 Fever, unspecified: Secondary | ICD-10-CM

## 2014-04-07 DIAGNOSIS — E870 Hyperosmolality and hypernatremia: Secondary | ICD-10-CM | POA: Diagnosis present

## 2014-04-07 DIAGNOSIS — R935 Abnormal findings on diagnostic imaging of other abdominal regions, including retroperitoneum: Secondary | ICD-10-CM | POA: Insufficient documentation

## 2014-04-07 DIAGNOSIS — K859 Acute pancreatitis, unspecified: Secondary | ICD-10-CM | POA: Diagnosis not present

## 2014-04-07 DIAGNOSIS — D649 Anemia, unspecified: Secondary | ICD-10-CM

## 2014-04-07 DIAGNOSIS — R079 Chest pain, unspecified: Secondary | ICD-10-CM

## 2014-04-07 DIAGNOSIS — R1013 Epigastric pain: Secondary | ICD-10-CM | POA: Diagnosis not present

## 2014-04-07 DIAGNOSIS — K868 Other specified diseases of pancreas: Secondary | ICD-10-CM | POA: Diagnosis present

## 2014-04-07 DIAGNOSIS — K921 Melena: Secondary | ICD-10-CM | POA: Diagnosis not present

## 2014-04-07 DIAGNOSIS — A419 Sepsis, unspecified organism: Principal | ICD-10-CM

## 2014-04-07 DIAGNOSIS — K219 Gastro-esophageal reflux disease without esophagitis: Secondary | ICD-10-CM | POA: Diagnosis not present

## 2014-04-07 DIAGNOSIS — R1110 Vomiting, unspecified: Secondary | ICD-10-CM | POA: Diagnosis not present

## 2014-04-07 DIAGNOSIS — K648 Other hemorrhoids: Secondary | ICD-10-CM | POA: Diagnosis present

## 2014-04-07 DIAGNOSIS — D473 Essential (hemorrhagic) thrombocythemia: Secondary | ICD-10-CM | POA: Diagnosis present

## 2014-04-07 DIAGNOSIS — R0602 Shortness of breath: Secondary | ICD-10-CM | POA: Diagnosis not present

## 2014-04-07 HISTORY — DX: Gastro-esophageal reflux disease without esophagitis: K21.9

## 2014-04-07 HISTORY — DX: Other seasonal allergic rhinitis: J30.2

## 2014-04-07 LAB — CBC WITH DIFFERENTIAL/PLATELET
Basophils Absolute: 0 10*3/uL (ref 0.0–0.1)
Basophils Relative: 0 % (ref 0–1)
Eosinophils Absolute: 0 10*3/uL (ref 0.0–0.7)
Eosinophils Relative: 0 % (ref 0–5)
HCT: 30.2 % — ABNORMAL LOW (ref 39.0–52.0)
Hemoglobin: 10.1 g/dL — ABNORMAL LOW (ref 13.0–17.0)
Lymphocytes Relative: 6 % — ABNORMAL LOW (ref 12–46)
Lymphs Abs: 0.8 10*3/uL (ref 0.7–4.0)
MCH: 30.9 pg (ref 26.0–34.0)
MCHC: 33.4 g/dL (ref 30.0–36.0)
MCV: 92.4 fL (ref 78.0–100.0)
Monocytes Absolute: 0.8 10*3/uL (ref 0.1–1.0)
Monocytes Relative: 5 % (ref 3–12)
Neutro Abs: 12.6 10*3/uL — ABNORMAL HIGH (ref 1.7–7.7)
Neutrophils Relative %: 89 % — ABNORMAL HIGH (ref 43–77)
Platelets: 476 10*3/uL — ABNORMAL HIGH (ref 150–400)
RBC: 3.27 MIL/uL — ABNORMAL LOW (ref 4.22–5.81)
RDW: 14 % (ref 11.5–15.5)
WBC: 14.3 10*3/uL — ABNORMAL HIGH (ref 4.0–10.5)

## 2014-04-07 LAB — COMPREHENSIVE METABOLIC PANEL
ALT: 65 U/L — ABNORMAL HIGH (ref 0–53)
AST: 54 U/L — ABNORMAL HIGH (ref 0–37)
Albumin: 2.4 g/dL — ABNORMAL LOW (ref 3.5–5.2)
Alkaline Phosphatase: 57 U/L (ref 39–117)
Anion gap: 15 (ref 5–15)
BUN: 10 mg/dL (ref 6–23)
CO2: 23 mEq/L (ref 19–32)
Calcium: 8.3 mg/dL — ABNORMAL LOW (ref 8.4–10.5)
Chloride: 100 mEq/L (ref 96–112)
Creatinine, Ser: 1.08 mg/dL (ref 0.50–1.35)
GFR calc Af Amer: 80 mL/min — ABNORMAL LOW (ref >90)
GFR calc non Af Amer: 69 mL/min — ABNORMAL LOW (ref >90)
Glucose, Bld: 115 mg/dL — ABNORMAL HIGH (ref 70–99)
Potassium: 3.5 mEq/L — ABNORMAL LOW (ref 3.7–5.3)
Sodium: 138 mEq/L (ref 137–147)
Total Bilirubin: 0.8 mg/dL (ref 0.3–1.2)
Total Protein: 6.9 g/dL (ref 6.0–8.3)

## 2014-04-07 LAB — LIPASE, BLOOD: Lipase: 60 U/L — ABNORMAL HIGH (ref 11–59)

## 2014-04-07 LAB — TROPONIN I: Troponin I: <0.3 ng/mL (ref <0.30)

## 2014-04-07 LAB — LACTATE DEHYDROGENASE: LDH: 553 U/L — ABNORMAL HIGH (ref 94–250)

## 2014-04-07 LAB — I-STAT CG4 LACTIC ACID, ED: Lactic Acid, Venous: 0.93 mmol/L (ref 0.5–2.2)

## 2014-04-07 MED ORDER — SODIUM CHLORIDE 0.9 % IV BOLUS (SEPSIS)
1000.0000 mL | Freq: Once | INTRAVENOUS | Status: AC
  Administered 2014-04-07: 1000 mL via INTRAVENOUS

## 2014-04-07 MED ORDER — VANCOMYCIN HCL 10 G IV SOLR
1250.0000 mg | Freq: Two times a day (BID) | INTRAVENOUS | Status: DC
  Administered 2014-04-08 (×2): 1250 mg via INTRAVENOUS
  Filled 2014-04-07 (×3): qty 1250

## 2014-04-07 MED ORDER — VANCOMYCIN HCL 10 G IV SOLR
2000.0000 mg | Freq: Once | INTRAVENOUS | Status: AC
  Administered 2014-04-07: 2000 mg via INTRAVENOUS
  Filled 2014-04-07: qty 2000

## 2014-04-07 MED ORDER — PANTOPRAZOLE SODIUM 40 MG PO TBEC
40.0000 mg | DELAYED_RELEASE_TABLET | Freq: Every day | ORAL | Status: DC
  Administered 2014-04-08 – 2014-04-19 (×12): 40 mg via ORAL
  Filled 2014-04-07 (×11): qty 1

## 2014-04-07 MED ORDER — FLUTICASONE PROPIONATE HFA 220 MCG/ACT IN AERO
1.0000 | INHALATION_SPRAY | Freq: Two times a day (BID) | RESPIRATORY_TRACT | Status: DC
  Administered 2014-04-08 – 2014-04-19 (×22): 1 via RESPIRATORY_TRACT
  Filled 2014-04-07: qty 12

## 2014-04-07 MED ORDER — SODIUM CHLORIDE 0.9 % IV SOLN
INTRAVENOUS | Status: DC
  Administered 2014-04-07: 22:00:00 via INTRAVENOUS
  Administered 2014-04-07 – 2014-04-08 (×2): 1000 mL via INTRAVENOUS
  Administered 2014-04-09 – 2014-04-11 (×4): via INTRAVENOUS

## 2014-04-07 MED ORDER — SODIUM CHLORIDE 0.9 % IJ SOLN
3.0000 mL | Freq: Two times a day (BID) | INTRAMUSCULAR | Status: DC
  Administered 2014-04-08 – 2014-04-19 (×12): 3 mL via INTRAVENOUS

## 2014-04-07 MED ORDER — IOHEXOL 300 MG/ML  SOLN
25.0000 mL | Freq: Once | INTRAMUSCULAR | Status: AC | PRN
  Administered 2014-04-07: 25 mL via ORAL

## 2014-04-07 MED ORDER — PANTOPRAZOLE SODIUM 40 MG IV SOLR
40.0000 mg | Freq: Once | INTRAVENOUS | Status: AC
  Administered 2014-04-07: 40 mg via INTRAVENOUS
  Filled 2014-04-07: qty 40

## 2014-04-07 MED ORDER — PIPERACILLIN-TAZOBACTAM 3.375 G IVPB
3.3750 g | Freq: Three times a day (TID) | INTRAVENOUS | Status: DC
  Administered 2014-04-08 – 2014-04-16 (×25): 3.375 g via INTRAVENOUS
  Filled 2014-04-07 (×29): qty 50

## 2014-04-07 MED ORDER — ACETAMINOPHEN 325 MG PO TABS
650.0000 mg | ORAL_TABLET | Freq: Four times a day (QID) | ORAL | Status: DC | PRN
  Administered 2014-04-07 – 2014-04-13 (×7): 650 mg via ORAL
  Filled 2014-04-07 (×8): qty 2

## 2014-04-07 MED ORDER — HEPARIN SODIUM (PORCINE) 5000 UNIT/ML IJ SOLN
5000.0000 [IU] | Freq: Three times a day (TID) | INTRAMUSCULAR | Status: DC
  Administered 2014-04-07 – 2014-04-13 (×17): 5000 [IU] via SUBCUTANEOUS
  Filled 2014-04-07 (×21): qty 1

## 2014-04-07 MED ORDER — ONDANSETRON HCL 4 MG/2ML IJ SOLN
4.0000 mg | Freq: Once | INTRAMUSCULAR | Status: AC
  Administered 2014-04-07: 4 mg via INTRAVENOUS
  Filled 2014-04-07: qty 2

## 2014-04-07 MED ORDER — TECHNETIUM TC 99M MEBROFENIN IV KIT
5.0000 | PACK | Freq: Once | INTRAVENOUS | Status: AC | PRN
  Administered 2014-04-07: 5 via INTRAVENOUS

## 2014-04-07 MED ORDER — PIPERACILLIN-TAZOBACTAM 3.375 G IVPB 30 MIN
3.3750 g | Freq: Once | INTRAVENOUS | Status: AC
  Administered 2014-04-07: 3.375 g via INTRAVENOUS
  Filled 2014-04-07: qty 50

## 2014-04-07 MED ORDER — IOHEXOL 300 MG/ML  SOLN
100.0000 mL | Freq: Once | INTRAMUSCULAR | Status: AC | PRN
  Administered 2014-04-07: 100 mL via INTRAVENOUS

## 2014-04-07 MED ORDER — CHLORPROMAZINE HCL 25 MG/ML IJ SOLN
50.0000 mg | Freq: Once | INTRAMUSCULAR | Status: AC
  Administered 2014-04-07: 50 mg via INTRAMUSCULAR
  Filled 2014-04-07: qty 2

## 2014-04-07 MED ORDER — ACETAMINOPHEN 650 MG RE SUPP: 650.0000 mg | Freq: Four times a day (QID) | RECTAL | Status: DC | PRN

## 2014-04-07 NOTE — Progress Notes (Signed)
Pt's doctor D. Elgergawy informed of arrival.  Instructed pt can be NPO expect meds. Also informed him of his fever on arrival.  Instructed to give tylenol as ordered.  Will continue to monitor.  Karie Kirks, Therapist, sports.

## 2014-04-07 NOTE — H&P (Signed)
Patient Demographics  Mason [name], is a 68 y.o. male  MRN: [id number]   DOB - 1945/09/10  Admit Date - 04/07/2014  Outpatient Primary MD for the patient is No PCP Per Patient   With History of -  Past Medical History  Diagnosis Date  . Seasonal asthma   . Pancreatitis, acute 07/08/2013  . Gout attack 1990      Past Surgical History  Procedure Laterality Date  . Circumcision  ~ 1974  . Eye examination under anesthesia w/ retinal cryotherapy and retinal laser Right 2014    "for tear"  . Cholecystectomy N/A 03/29/2014    Procedure: LAPAROSCOPIC CHOLECYSTECTOMY WITH INTRAOPERATIVE CHOLANGIOGRAM;  Surgeon: Coralie Keens, MD;  Location: [facility];  Service: General;  Laterality: N/A;    in for   Chief Complaint  Patient presents with  . Post-op Problem     HPI  Mason [name]  is a 68 y.o. male, was recently discharged from [facility] on 04/02/14 , status post gallstone pancreatitis, with laparoscopic cholecystectomy on 03/29/14,he was treated with Primaxin initially then transitioned to Augmentin on discharge,m, patient has been complaining of fever over the last 48 hours, started to complain of epigastric abdominal pain, as well started to have belching, headache, patient was seen by his PCP yesterday who requested patient to come back to the ED for further evaluation, in ED patient was found to be afebrile, but had leukocytosis of 14,000, with tachycardia, patient had CT abdomen and pelvis done which was significant for fluid collection at the gallbladder fossa and worsening acute pancreatitis with evidence of pancreatic necrosis in body and tail, as well study was suspicious for left lower lobe pneumonia. Patient had blood cultures sent in ED, lactate level as well, started on IV vancomycin and Zosyn, and hospitalist requested to admit for further management and treatment.   Review of Systems    In addition to the HPI above, Reports fever and chills No Headache, No  changes with Vision or hearing, No problems swallowing food or Liquids, No Chest pain, Cough or Shortness of Breath, Planes ofAbdominal pain, mild Nausea or Vommitting, had one episode of diarrhea but resolved. No Blood in stool or Urine, No dysuria, No new skin rashes or bruises, No new joints pains-aches,  No new weakness, tingling, numbness in any extremity, No recent weight gain or loss, No polyuria, polydypsia or polyphagia, No significant Mental Stressors.  A full 10 point Review of Systems was done, except as stated above, all other Review of Systems were negative.   Social History History  Substance Use Topics  . Smoking status: Never Smoker   . Smokeless tobacco: Never Used  . Alcohol Use: 1.8 oz/week    2 Cans of beer, 1 Shots of liquor per week     Comment: 07/08/2013     Family History No family history on file.   Prior to Admission medications   Medication Sig Start Date End Date Taking? Authorizing Provider  albuterol (PROVENTIL HFA;VENTOLIN HFA) 108 (90 BASE) MCG/ACT inhaler Inhale 1-2 puffs into the lungs every 6 (six) hours as needed for wheezing or shortness of breath.   Yes Historical Provider, MD  amoxicillin-clavulanate (AUGMENTIN) 875-125 MG per tablet Take 1 tablet by mouth every 12 (twelve) hours. 04/02/14 04/07/14 Yes Annita Brod, MD  fluticasone (FLOVENT HFA) 220 MCG/ACT inhaler Inhale 1 puff into the lungs 2 (two) times daily.   Yes Historical Provider, MD  HYDROcodone-acetaminophen (NORCO/VICODIN) 5-325 MG per tablet Take  1 tablet by mouth every 4 (four) hours as needed for moderate pain. 04/01/14  Yes Annita Brod, MD  omeprazole (PRILOSEC) 20 MG capsule Take 20 mg by mouth daily.   Yes Historical Provider, MD  ibuprofen (ADVIL,MOTRIN) 200 MG tablet Take 400 mg by mouth every 6 (six) hours as needed for moderate pain.    Historical Provider, MD    No Known Allergies  Physical Exam  Vitals  Blood pressure 108/66, pulse 97,  temperature 98.6 F (37 C), temperature source Oral, resp. rate 25, height 6' (1.829 m), weight 92.987 kg (205 lb), SpO2 95 %.   1. General well-nourished male lying in bed in NAD,    2. Normal affect and insight, Not Suicidal or Homicidal, Awake Alert, Oriented X 3.  3. No F.N deficits, ALL C.Nerves Intact, Strength 5/5 all 4 extremities, Sensation intact all 4 extremities, Plantars down going.  4. Ears and Eyes appear Normal, Conjunctivae clear, PERRLA. Moist Oral Mucosa.  5. Supple Neck, No JVD, No cervical lymphadenopathy appriciated, No Carotid Bruits.  6. Symmetrical Chest wall movement, Good air movement bilaterally, CTAB.  7. RRR, No Gallops, Rubs or Murmurs, No Parasternal Heave.  8. Positive Bowel Sounds, Abdomen Soft, mild tenderness,mildly  No organomegaly appriciated,No rebound -guarding or rigidity.  9.  No Cyanosis, Normal Skin Turgor, No Skin Rash or Bruise.  10. Good muscle tone,  joints appear normal , no effusions, Normal ROM.  11. No Palpable Lymph Nodes in Neck or Axillae    Data Review  CBC  Recent Labs Lab 04/01/14 1605 04/07/14 1014  WBC 10.5 14.3*  HGB 10.2* 10.1*  HCT 29.4* 30.2*  PLT 225 476*  MCV 92.5 92.4  MCH 32.1 30.9  MCHC 34.7 33.4  RDW 14.0 14.0  LYMPHSABS  --  0.8  MONOABS  --  0.8  EOSABS  --  0.0  BASOSABS  --  0.0   ------------------------------------------------------------------------------------------------------------------  Chemistries   Recent Labs Lab 04/02/14 0854 04/07/14 1014  NA 140 138  K 3.0* 3.5*  CL 104 100  CO2 25 23  GLUCOSE 113* 115*  BUN 12 10  CREATININE 1.04 1.08  CALCIUM 8.1* 8.3*  AST 38* 54*  ALT 42 65*  ALKPHOS 66 57  BILITOT 0.9 0.8   ------------------------------------------------------------------------------------------------------------------ estimated creatinine clearance is 72.8 mL/min (by C-G formula based on Cr of  1.08). ------------------------------------------------------------------------------------------------------------------ No results for input(s): TSH, T4TOTAL, T3FREE, THYROIDAB in the last 72 hours.  Invalid input(s): FREET3   Coagulation profile No results for input(s): INR, PROTIME in the last 168 hours. ------------------------------------------------------------------------------------------------------------------- No results for input(s): DDIMER in the last 72 hours. -------------------------------------------------------------------------------------------------------------------  Cardiac Enzymes  Recent Labs Lab 04/07/14 1014  TROPONINI <0.30   ------------------------------------------------------------------------------------------------------------------ Invalid input(s): POCBNP   ---------------------------------------------------------------------------------------------------------------  Urinalysis    Component Value Date/Time   COLORURINE AMBER* 03/26/2014 0047   APPEARANCEUR CLEAR 03/26/2014 0047   LABSPEC 1.025 03/26/2014 0047   PHURINE 6.0 03/26/2014 0047   GLUCOSEU NEGATIVE 03/26/2014 0047   HGBUR NEGATIVE 03/26/2014 0047   BILIRUBINUR SMALL* 03/26/2014 0047   KETONESUR 15* 03/26/2014 0047   PROTEINUR NEGATIVE 03/26/2014 0047   UROBILINOGEN 1.0 03/26/2014 0047   NITRITE NEGATIVE 03/26/2014 0047   LEUKOCYTESUR NEGATIVE 03/26/2014 0047    ----------------------------------------------------------------------------------------------------------------  Imaging results:   Dg Chest 2 View  04/07/2014   CLINICAL DATA:  Short of breath with exertion  EXAM: CHEST  2 VIEW  COMPARISON:  Chest x-ray of 03/31/2014  FINDINGS: Streaky opacity remains primarily at the left  lung base which may represent scarring, atelectasis, or possibly residual pneumonia. The right lung is clear. The heart is within normal limits in size. No mediastinal or hilar abnormality is  seen. No bony abnormality is noted.  IMPRESSION: Persistent left basilar opacity may represent atelectasis, scarring, or possible residual pneumonia.   Electronically Signed   By: Ivar Drape M.D.   On: 04/07/2014 13:38   Ct Abdomen Pelvis W Contrast  04/07/2014   CLINICAL DATA:  Hiccups for 5 days with acid reflux. Recent gallbladder surgery.  EXAM: CT ABDOMEN AND PELVIS WITH CONTRAST  TECHNIQUE: Multidetector CT imaging of the abdomen and pelvis was performed using the standard protocol following bolus administration of intravenous contrast.  CONTRAST:  122mL OMNIPAQUE IOHEXOL 300 MG/ML  SOLN  COMPARISON:  03/28/2014  FINDINGS: Lower chest: Lung bases show mild airspace disease in the lower lobes, left greater than right, with a small left pleural effusion. Heart size within normal limits. No pericardial effusion.  Hepatobiliary: 4 mm low-attenuation lesion in the left hepatic lobe is unchanged and likely a cyst or hemangioma. A low-attenuation fluid collection is seen in the gallbladder fossa after cholecystectomy, measuring 2.6 x 4.3 cm. Extrahepatic bile duct measures up to 7 mm with minimal intrahepatic biliary duct dilatation.  Pancreas: There are low attenuation areas in the pancreas, especially pancreatic tail, with an enlarged, edematous and heterogeneous pancreatic head and uncinate process. Findings are progressive from 03/28/2014. Surrounding peripancreatic inflammatory stranding and fluid.  Spleen: Negative.  Adrenals/Urinary Tract: Adrenal glands and right kidney are unremarkable. 1.3 cm fluid density lesion in the left kidney is unchanged and likely a cyst. 5 mm low-attenuation lesion in the lower pole left kidney is too small to characterize but also likely a cyst. Ureters are decompressed. Bladder is grossly unremarkable.  Stomach/Bowel: Stomach is unremarkable. Duodenal wall thickening is likely secondary to the adjacent pancreatic inflammatory changes. Small bowel, appendix and colon are  otherwise unremarkable.  Vascular/Lymphatic: Scattered atherosclerotic calcification of the arterial vasculature without abdominal aortic aneurysm. Retroperitoneal lymph nodes measure up to 13 mm in the low left periaortic station, increased from 10 mm. Peripancreatic lymph nodes measure up to 11 mm, previously 9 mm.  Reproductive: Prostate is normal in size.  Other: Small bilateral inguinal hernias contain fat, left greater than right. No free fluid. No free air.  Musculoskeletal: No worrisome lytic or sclerotic lesions.  IMPRESSION: 1. Worsening acute pancreatitis with evidence of necrosis in the pancreatic body and tail. 2. Fluid collection in the gallbladder fossa. Difficult to exclude a biloma or abscess after cholecystectomy. 3. Scattered peripancreatic and retroperitoneal adenopathy, most likely reactive. 4. Small left pleural effusion with airspace disease in the left lower lobe. Difficult to exclude pneumonia.   Electronically Signed   By: Lorin Picket M.D.   On: 04/07/2014 12:49        Assessment & Plan  Principal Problem:   Sepsis Active Problems:   Pancreatitis   HCAP (healthcare-associated pneumonia)    Sepsis: She presents with tachycardia, leukocytosis, lactic acid still pending, has sepsis, most likely related to intra-abdominal process, related to pancreatic tail necrosis versus exostosis at the gallbladder fossa/biliary leak, with healthcare acquired pneumonia, blood cultures were sent, vision is being started on broad-spectrum IV antibiotics.   Acute pancreatitis -With evidence of body and tail necrosis on the CT abdomen and pelvis, continue with aggressive IV fluids, keep nothing by mouth, on broad-spectrum antibiotic coverage. -As well will obtain HIDA scan to rule out any biliary leak or  obstruction as a cause for collection around gallbladder fossa -surgery are following  Healthcare acquired pneumonia -Patient started on IV vancomycin and Zosyn.   DVT  Prophylaxis Heparin -   SCDs   AM Labs Ordered, also please review Full Orders  Family Communication: Admission, patients condition and plan of care including tests being ordered have been discussed with the patient and wife who indicate understanding and agree with the plan and Code Status.  Code Status  Full  DISPOSITION: Admit to telemetry  Condition GUARDED    Time spent in minutes :55 minutes    Mikyla Schachter M.D on 04/07/2014 at 2:15 PM  Between 7am to 7pm - Pager - [phone]  After 7pm go to [email or web address] - password [email or web address]  And look for the night coverage person covering me after hours  [facility] Office  [phone]   **Disclaimer: This note may have been dictated with voice recognition software. Similar sounding words can inadvertently be transcribed and this note may contain transcription errors which may not have been corrected upon publication of note.**

## 2014-04-07 NOTE — ED Notes (Signed)
Communication with Pharmacy

## 2014-04-07 NOTE — ED Notes (Signed)
Patient being transported by Opal Sidles, [place]

## 2014-04-07 NOTE — Consult Note (Addendum)
Reason for Consult:Post-op bloating, belching, fever Referring Physician: [name] is an 68 y.o. male.  HPI: This patient presented 11/5 with nausea, vomiting, and upper abdominal pain.  He was found to have biliary pancreatitis with a lipase over 3000 and multiple gallstones on ultrasound.  He underwent laparoscopic cholecystectomy on 11/8 by Dr. Ninfa Linden.  He improved and was discharged home on 11/12.  He was treated with Primaxin initially and transitioned to Augmentin.     He presents today with a main complaint of reflux symptoms with belching and burping.  He has had daily bowel movements, which are reportedly normal.  He did have some diarrhea but this has improved.  No nausea or vomiting.  Febrile up to 101.8.  Past Medical History  Diagnosis Date  . Seasonal asthma   . Pancreatitis, acute 07/08/2013  . Gout attack 1990    Past Surgical History  Procedure Laterality Date  . Circumcision  ~ 1974  . Eye examination under anesthesia w/ retinal cryotherapy and retinal laser Right 2014    "for tear"  . Cholecystectomy N/A 03/29/2014    Procedure: LAPAROSCOPIC CHOLECYSTECTOMY WITH INTRAOPERATIVE CHOLANGIOGRAM;  Surgeon: Coralie Keens, MD;  Location: [facility];  Service: General;  Laterality: N/A;    No family history on file.  Social History:  reports that he has never smoked. He has never used smokeless tobacco. He reports that he drinks about 1.8 oz of alcohol per week. He reports that he does not use illicit drugs.  Allergies: No Known Allergies  Medications:  Prior to Admission medications   Medication Sig Start Date End Date Taking? Authorizing Provider  albuterol (PROVENTIL HFA;VENTOLIN HFA) 108 (90 BASE) MCG/ACT inhaler Inhale 1-2 puffs into the lungs every 6 (six) hours as needed for wheezing or shortness of breath.   Yes Historical Provider, MD  amoxicillin-clavulanate (AUGMENTIN) 875-125 MG per tablet Take 1 tablet by mouth every 12 (twelve) hours. 04/02/14  04/07/14 Yes Annita Brod, MD  fluticasone (FLOVENT HFA) 220 MCG/ACT inhaler Inhale 1 puff into the lungs 2 (two) times daily.   Yes Historical Provider, MD  HYDROcodone-acetaminophen (NORCO/VICODIN) 5-325 MG per tablet Take 1 tablet by mouth every 4 (four) hours as needed for moderate pain. 04/01/14  Yes Annita Brod, MD  omeprazole (PRILOSEC) 20 MG capsule Take 20 mg by mouth daily.   Yes Historical Provider, MD  ibuprofen (ADVIL,MOTRIN) 200 MG tablet Take 400 mg by mouth every 6 (six) hours as needed for moderate pain.    Historical Provider, MD     Results for orders placed or performed during the hospital encounter of 04/07/14 (from the past 48 hour(s))  CBC with Differential     Status: Abnormal   Collection Time: 04/07/14 10:14 AM  Result Value Ref Range   WBC 14.3 (H) 4.0 - 10.5 K/uL   RBC 3.27 (L) 4.22 - 5.81 MIL/uL   Hemoglobin 10.1 (L) 13.0 - 17.0 g/dL   HCT 30.2 (L) 39.0 - 52.0 %   MCV 92.4 78.0 - 100.0 fL   MCH 30.9 26.0 - 34.0 pg   MCHC 33.4 30.0 - 36.0 g/dL   RDW 14.0 11.5 - 15.5 %   Platelets 476 (H) 150 - 400 K/uL   Neutrophils Relative % 89 (H) 43 - 77 %   Neutro Abs 12.6 (H) 1.7 - 7.7 K/uL   Lymphocytes Relative 6 (L) 12 - 46 %   Lymphs Abs 0.8 0.7 - 4.0 K/uL   Monocytes Relative 5  3 - 12 %   Monocytes Absolute 0.8 0.1 - 1.0 K/uL   Eosinophils Relative 0 0 - 5 %   Eosinophils Absolute 0.0 0.0 - 0.7 K/uL   Basophils Relative 0 0 - 1 %   Basophils Absolute 0.0 0.0 - 0.1 K/uL  Comprehensive metabolic panel     Status: Abnormal   Collection Time: 04/07/14 10:14 AM  Result Value Ref Range   Sodium 138 137 - 147 mEq/L   Potassium 3.5 (L) 3.7 - 5.3 mEq/L   Chloride 100 96 - 112 mEq/L   CO2 23 19 - 32 mEq/L   Glucose, Bld 115 (H) 70 - 99 mg/dL   BUN 10 6 - 23 mg/dL   Creatinine, Ser 1.08 0.50 - 1.35 mg/dL   Calcium 8.3 (L) 8.4 - 10.5 mg/dL   Total Protein 6.9 6.0 - 8.3 g/dL   Albumin 2.4 (L) 3.5 - 5.2 g/dL   AST 54 (H) 0 - 37 U/L   ALT 65 (H) 0 - 53  U/L   Alkaline Phosphatase 57 39 - 117 U/L   Total Bilirubin 0.8 0.3 - 1.2 mg/dL   GFR calc non Af Amer 69 (L) >90 mL/min   GFR calc Af Amer 80 (L) >90 mL/min    Comment: (NOTE) The eGFR has been calculated using the CKD EPI equation. This calculation has not been validated in all clinical situations. eGFR's persistently <90 mL/min signify possible Chronic Kidney Disease.    Anion gap 15 5 - 15  Troponin I     Status: None   Collection Time: 04/07/14 10:14 AM  Result Value Ref Range   Troponin I <0.30 <0.30 ng/mL    Comment:        Due to the release kinetics of cTnI, a negative result within the first hours of the onset of symptoms does not rule out myocardial infarction with certainty. If myocardial infarction is still suspected, repeat the test at appropriate intervals.   Lipase, blood     Status: Abnormal   Collection Time: 04/07/14 10:14 AM  Result Value Ref Range   Lipase 60 (H) 11 - 59 U/L    Dg Chest 2 View  04/07/2014   CLINICAL DATA:  Short of breath with exertion  EXAM: CHEST  2 VIEW  COMPARISON:  Chest x-ray of 03/31/2014  FINDINGS: Streaky opacity remains primarily at the left lung base which may represent scarring, atelectasis, or possibly residual pneumonia. The right lung is clear. The heart is within normal limits in size. No mediastinal or hilar abnormality is seen. No bony abnormality is noted.  IMPRESSION: Persistent left basilar opacity may represent atelectasis, scarring, or possible residual pneumonia.   Electronically Signed   By: Ivar Drape M.D.   On: 04/07/2014 13:38   Ct Abdomen Pelvis W Contrast  04/07/2014   CLINICAL DATA:  Hiccups for 5 days with acid reflux. Recent gallbladder surgery.  EXAM: CT ABDOMEN AND PELVIS WITH CONTRAST  TECHNIQUE: Multidetector CT imaging of the abdomen and pelvis was performed using the standard protocol following bolus administration of intravenous contrast.  CONTRAST:  169m OMNIPAQUE IOHEXOL 300 MG/ML  SOLN  COMPARISON:   03/28/2014  FINDINGS: Lower chest: Lung bases show mild airspace disease in the lower lobes, left greater than right, with a small left pleural effusion. Heart size within normal limits. No pericardial effusion.  Hepatobiliary: 4 mm low-attenuation lesion in the left hepatic lobe is unchanged and likely a cyst or hemangioma. A low-attenuation fluid collection is  seen in the gallbladder fossa after cholecystectomy, measuring 2.6 x 4.3 cm. Extrahepatic bile duct measures up to 7 mm with minimal intrahepatic biliary duct dilatation.  Pancreas: There are low attenuation areas in the pancreas, especially pancreatic tail, with an enlarged, edematous and heterogeneous pancreatic head and uncinate process. Findings are progressive from 03/28/2014. Surrounding peripancreatic inflammatory stranding and fluid.  Spleen: Negative.  Adrenals/Urinary Tract: Adrenal glands and right kidney are unremarkable. 1.3 cm fluid density lesion in the left kidney is unchanged and likely a cyst. 5 mm low-attenuation lesion in the lower pole left kidney is too small to characterize but also likely a cyst. Ureters are decompressed. Bladder is grossly unremarkable.  Stomach/Bowel: Stomach is unremarkable. Duodenal wall thickening is likely secondary to the adjacent pancreatic inflammatory changes. Small bowel, appendix and colon are otherwise unremarkable.  Vascular/Lymphatic: Scattered atherosclerotic calcification of the arterial vasculature without abdominal aortic aneurysm. Retroperitoneal lymph nodes measure up to 13 mm in the low left periaortic station, increased from 10 mm. Peripancreatic lymph nodes measure up to 11 mm, previously 9 mm.  Reproductive: Prostate is normal in size.  Other: Small bilateral inguinal hernias contain fat, left greater than right. No free fluid. No free air.  Musculoskeletal: No worrisome lytic or sclerotic lesions.  IMPRESSION: 1. Worsening acute pancreatitis with evidence of necrosis in the pancreatic body  and tail. 2. Fluid collection in the gallbladder fossa. Difficult to exclude a biloma or abscess after cholecystectomy. 3. Scattered peripancreatic and retroperitoneal adenopathy, most likely reactive. 4. Small left pleural effusion with airspace disease in the left lower lobe. Difficult to exclude pneumonia.   Electronically Signed   By: Lorin Picket M.D.   On: 04/07/2014 12:49    ROS Blood pressure 111/61, pulse 93, temperature 98.6 F (37 C), temperature source Oral, resp. rate 26, height 6' (1.829 m), weight 205 lb (92.987 kg), SpO2 93 %. Physical Exam WDWN in NAD HEENT:  EOMI, sclera anicteric Neck:  No masses, no thyromegaly Lungs:  CTA bilaterally; normal respiratory effort CV:  Regular rate and rhythm; no murmurs Abd:  +bowel sounds, soft, mildly distended; well-healed incisions; no abdominal tenderness Ext:  Well-perfused; no edema Skin:  Warm, dry; no sign of jaundice    Assessment/Plan: S/p lap chole with IOC for gallstone pancreatitis - now with GERD symptoms and abnormal findings on CT scan.    HIDA scan to rule out biliary leak or obstruction Admit for bowel rest (may have ice chips) IV antibiotics - we will watch the pancreatic tail necrosis for now   Protonix for reflux symptoms.  Shamia Uppal K. 04/07/2014, 2:05 PM

## 2014-04-07 NOTE — ED Notes (Addendum)
Pt coming from Dr. Zadie Cleverly office at [place], he is 9 days post-op gallbladder removal since then has been having epigastric pain, burning, burping and hiccups. Also fevers and 1 loose stool. Pt is a x 4. Reports he feels exhausted.

## 2014-04-07 NOTE — ED Notes (Signed)
MD at bedside. 

## 2014-04-07 NOTE — Progress Notes (Addendum)
ANTIBIOTIC CONSULT NOTE - INITIAL  Pharmacy Consult for Vancomycin Indication: pneumonia  No Known Allergies  Patient Measurements: Height: 6' (182.9 cm) Weight: 205 lb (92.987 kg) IBW/kg (Calculated) : 77.6  Vital Signs: Temp: 98.6 F (37 C) (11/17 0928) Temp Source: Oral (11/17 0928) BP: 111/61 mmHg (11/17 1300) Pulse Rate: 93 (11/17 1300) Intake/Output from previous day:   Intake/Output from this shift:    Labs:  Recent Labs  04/07/14 1014  WBC 14.3*  HGB 10.1*  PLT 476*  CREATININE 1.08   Estimated Creatinine Clearance: 72.8 mL/min (by C-G formula based on Cr of 1.08). No results for input(s): VANCOTROUGH, VANCOPEAK, VANCORANDOM, GENTTROUGH, GENTPEAK, GENTRANDOM, TOBRATROUGH, TOBRAPEAK, TOBRARND, AMIKACINPEAK, AMIKACINTROU, AMIKACIN in the last 72 hours.   Microbiology: Recent Results (from the past 720 hour(s))  Culture, blood (routine x 2)     Status: None   Collection Time: 03/27/14  9:10 AM  Result Value Ref Range Status   Specimen Description BLOOD RIGHT ANTECUBITAL  Final   Special Requests BOTTLES DRAWN AEROBIC AND ANAEROBIC 10CC  Final   Culture  Setup Time   Final    03/27/2014 15:41 Performed at [facility]    Culture   Final    NO GROWTH 5 DAYS Performed at [facility]    Report Status 04/02/2014 FINAL  Final  Culture, blood (routine x 2)     Status: None   Collection Time: 03/27/14  9:16 AM  Result Value Ref Range Status   Specimen Description BLOOD RIGHT HAND  Final   Special Requests BOTTLES DRAWN AEROBIC AND ANAEROBIC 10CC  Final   Culture  Setup Time   Final    03/27/2014 15:39 Performed at [facility]    Culture   Final    NO GROWTH 5 DAYS Performed at [facility]    Report Status 04/02/2014 FINAL  Final  Surgical pcr screen     Status: None   Collection Time: 03/29/14 10:51 AM  Result Value Ref Range Status   MRSA, PCR NEGATIVE NEGATIVE Final   Staphylococcus aureus NEGATIVE NEGATIVE Final     Comment:        The Xpert SA Assay (FDA approved for NASAL specimens in patients over 50 years of age), is one component of a comprehensive surveillance program.  Test performance has been validated by [employer] for patients greater than or equal to 21 year old. It is not intended to diagnose infection nor to guide or monitor treatment.   Clostridium Difficile by PCR     Status: None   Collection Time: 03/31/14  9:33 AM  Result Value Ref Range Status   C difficile by pcr NEGATIVE NEGATIVE Final    Medical History: Past Medical History  Diagnosis Date  . Seasonal asthma   . Pancreatitis, acute 07/08/2013  . Gout attack 1990   Assessment: 68 yo m who presented to the ED on 11/17 for epigastirc pain, burning, burping and hiccups s/p gallbladder removal.  Pharmacy is consulted to begin vancomycin for pneumonia.  Wbc 14.3, tmax 98.6, SCr 1.08, CrCl ~73 ml/min.   Goal of Therapy:  Vancomycin trough level 15-20 mcg/ml  Plan:  Vancomycin 2,000 mg IV x 1 Followed by vancomycin 1,250 mg IV q12hrs Monitor CBC, renal function, C&S, clinical course  Cassie L. Nicole Kindred, PharmD Clinical Pharmacy Resident Pager: [phone] 04/07/2014 2:12 PM   Addendum: Adding Zosyn for HCAP coverage.  Zosyn 3.375g IV x1 given in ED at 1707 PM.  Plan: Zosyn  3.375g IV q8h - each dose over 4 hours.   Sloan Leiter, PharmD, BCPS Clinical Pharmacist [phone] 04/07/2014, 9:52 PM

## 2014-04-07 NOTE — ED Provider Notes (Signed)
CSN: [id number]     Arrival date & time 04/07/14  9509 History   First MD Initiated Contact with Patient 04/07/14 0932     Chief Complaint  Patient presents with  . Post-op Problem      HPI  Patient presents for evaluation of abdominal pain, burping and hiccups, nausea and vomiting, after recent admission for gallstone pancreatitis, and laparoscopic cholecystectomy.  Admission in February of this year with gallstone pancreatitis. Treated with bowel rest and nothing by mouth IV fluids. Had intermittent episodes of fever while hospitalized. Ultimately recovered and was able to be discharged.Did not have outpatient cholecystectomy.   Presented again 11/5 with abdominal pain and found to have gallstone pancreatitis with markedly elevated lipase. Place nothing by mouth and underwent laparoscopic cholecystectomy. His pancreatitis improved. His enzymes improved. He is able to take some by mouth. However, upon discharge last Thursday, 5 days ago, he  redeveloped epigastric pain, frequent belching and almost constant hiccups.   Seen by his primary care physician yesterday, Dr. Orland Mustard at [facility]. CT scan recommended, but declined by patient and more so by his wife. Had emesis in their office.  Resistance with continued symptoms. Sleepless nights 2/2 hiccups, and Increasing Abdominal Pain. He Has Had A Few Loose Bowel Movements since Discharge.  Past Medical History  Diagnosis Date  . Seasonal asthma   . Pancreatitis, acute 07/08/2013  . Gout attack 1990   Past Surgical History  Procedure Laterality Date  . Circumcision  ~ 1974  . Eye examination under anesthesia w/ retinal cryotherapy and retinal laser Right 2014    "for tear"  . Cholecystectomy N/A 03/29/2014    Procedure: LAPAROSCOPIC CHOLECYSTECTOMY WITH INTRAOPERATIVE CHOLANGIOGRAM;  Surgeon: Coralie Keens, MD;  Location: [place];  Service: General;  Laterality: N/A;   No family history on file. History  Substance Use Topics  .  Smoking status: Never Smoker   . Smokeless tobacco: Never Used  . Alcohol Use: 1.8 oz/week    2 Cans of beer, 1 Shots of liquor per week     Comment: 07/08/2013    Review of Systems  Constitutional: Negative for fever, chills, diaphoresis, appetite change and fatigue.  HENT: Negative for mouth sores, sore throat and trouble swallowing.   Eyes: Negative for visual disturbance.  Respiratory: Negative for cough, chest tightness, shortness of breath and wheezing.   Cardiovascular: Negative for chest pain.  Gastrointestinal: Positive for nausea, vomiting, abdominal pain, diarrhea and abdominal distention.       Hiccups, and frequent belching  Endocrine: Negative for polydipsia, polyphagia and polyuria.  Genitourinary: Negative for dysuria, frequency and hematuria.  Musculoskeletal: Negative for gait problem.  Skin: Negative for color change, pallor and rash.  Neurological: Negative for dizziness, syncope, light-headedness and headaches.  Hematological: Does not bruise/bleed easily.  Psychiatric/Behavioral: Negative for behavioral problems and confusion.      Allergies  Review of patient's allergies indicates no known allergies.  Home Medications   Prior to Admission medications   Medication Sig Start Date End Date Taking? Authorizing Provider  albuterol (PROVENTIL HFA;VENTOLIN HFA) 108 (90 BASE) MCG/ACT inhaler Inhale 1-2 puffs into the lungs every 6 (six) hours as needed for wheezing or shortness of breath.   Yes Historical Provider, MD  amoxicillin-clavulanate (AUGMENTIN) 875-125 MG per tablet Take 1 tablet by mouth every 12 (twelve) hours. 04/02/14 04/07/14 Yes Annita Brod, MD  fluticasone (FLOVENT HFA) 220 MCG/ACT inhaler Inhale 1 puff into the lungs 2 (two) times daily.   Yes Historical Provider,  MD  HYDROcodone-acetaminophen (NORCO/VICODIN) 5-325 MG per tablet Take 1 tablet by mouth every 4 (four) hours as needed for moderate pain. 04/01/14  Yes Annita Brod, MD    omeprazole (PRILOSEC) 20 MG capsule Take 20 mg by mouth daily.   Yes Historical Provider, MD  ibuprofen (ADVIL,MOTRIN) 200 MG tablet Take 400 mg by mouth every 6 (six) hours as needed for moderate pain.    Historical Provider, MD   BP 111/61 mmHg  Pulse 93  Temp(Src) 98.6 F (37 C) (Oral)  Resp 26  Ht 6' (1.829 m)  Wt 205 lb (92.987 kg)  BMI 27.80 kg/m2  SpO2 93% Physical Exam  Constitutional: He is oriented to person, place, and time. He appears well-developed and well-nourished. No distress.  HENT:  Head: Normocephalic.  Eyes: Conjunctivae are normal. Pupils are equal, round, and reactive to light. No scleral icterus.  Neck: Normal range of motion. Neck supple. No thyromegaly present.  Cardiovascular: Normal rate and regular rhythm.  Exam reveals no gallop and no friction rub.   No murmur heard. Pulmonary/Chest: Effort normal and breath sounds normal. No respiratory distress. He has no wheezes. He has no rales.  Clear lungs. Well oxygenated.  Abdominal: Soft. He exhibits distension. Bowel sounds are decreased. There is tenderness in the epigastric area. There is no rigidity, no rebound and no guarding.    Musculoskeletal: Normal range of motion.  Neurological: He is alert and oriented to person, place, and time.  Skin: Skin is warm and dry. No rash noted.  Psychiatric: He has a normal mood and affect. His behavior is normal.    ED Course  Procedures (including critical care time) Labs Review Labs Reviewed  CBC WITH DIFFERENTIAL - Abnormal; Notable for the following:    WBC 14.3 (*)    RBC 3.27 (*)    Hemoglobin 10.1 (*)    HCT 30.2 (*)    Platelets 476 (*)    Neutrophils Relative % 89 (*)    Neutro Abs 12.6 (*)    Lymphocytes Relative 6 (*)    All other components within normal limits  COMPREHENSIVE METABOLIC PANEL - Abnormal; Notable for the following:    Potassium 3.5 (*)    Glucose, Bld 115 (*)    Calcium 8.3 (*)    Albumin 2.4 (*)    AST 54 (*)    ALT 65 (*)     GFR calc non Af Amer 69 (*)    GFR calc Af Amer 80 (*)    All other components within normal limits  LIPASE, BLOOD - Abnormal; Notable for the following:    Lipase 60 (*)    All other components within normal limits  CULTURE, BLOOD (ROUTINE X 2)  CULTURE, BLOOD (ROUTINE X 2)  TROPONIN I  LACTATE DEHYDROGENASE  I-STAT CG4 LACTIC ACID, ED    Imaging Review Dg Chest 2 View  04/07/2014   CLINICAL DATA:  Short of breath with exertion  EXAM: CHEST  2 VIEW  COMPARISON:  Chest x-ray of 03/31/2014  FINDINGS: Streaky opacity remains primarily at the left lung base which may represent scarring, atelectasis, or possibly residual pneumonia. The right lung is clear. The heart is within normal limits in size. No mediastinal or hilar abnormality is seen. No bony abnormality is noted.  IMPRESSION: Persistent left basilar opacity may represent atelectasis, scarring, or possible residual pneumonia.   Electronically Signed   By: Ivar Drape M.D.   On: 04/07/2014 13:38   Ct Abdomen Pelvis W  Contrast  04/07/2014   CLINICAL DATA:  Hiccups for 5 days with acid reflux. Recent gallbladder surgery.  EXAM: CT ABDOMEN AND PELVIS WITH CONTRAST  TECHNIQUE: Multidetector CT imaging of the abdomen and pelvis was performed using the standard protocol following bolus administration of intravenous contrast.  CONTRAST:  193mL OMNIPAQUE IOHEXOL 300 MG/ML  SOLN  COMPARISON:  03/28/2014  FINDINGS: Lower chest: Lung bases show mild airspace disease in the lower lobes, left greater than right, with a small left pleural effusion. Heart size within normal limits. No pericardial effusion.  Hepatobiliary: 4 mm low-attenuation lesion in the left hepatic lobe is unchanged and likely a cyst or hemangioma. A low-attenuation fluid collection is seen in the gallbladder fossa after cholecystectomy, measuring 2.6 x 4.3 cm. Extrahepatic bile duct measures up to 7 mm with minimal intrahepatic biliary duct dilatation.  Pancreas: There are low  attenuation areas in the pancreas, especially pancreatic tail, with an enlarged, edematous and heterogeneous pancreatic head and uncinate process. Findings are progressive from 03/28/2014. Surrounding peripancreatic inflammatory stranding and fluid.  Spleen: Negative.  Adrenals/Urinary Tract: Adrenal glands and right kidney are unremarkable. 1.3 cm fluid density lesion in the left kidney is unchanged and likely a cyst. 5 mm low-attenuation lesion in the lower pole left kidney is too small to characterize but also likely a cyst. Ureters are decompressed. Bladder is grossly unremarkable.  Stomach/Bowel: Stomach is unremarkable. Duodenal wall thickening is likely secondary to the adjacent pancreatic inflammatory changes. Small bowel, appendix and colon are otherwise unremarkable.  Vascular/Lymphatic: Scattered atherosclerotic calcification of the arterial vasculature without abdominal aortic aneurysm. Retroperitoneal lymph nodes measure up to 13 mm in the low left periaortic station, increased from 10 mm. Peripancreatic lymph nodes measure up to 11 mm, previously 9 mm.  Reproductive: Prostate is normal in size.  Other: Small bilateral inguinal hernias contain fat, left greater than right. No free fluid. No free air.  Musculoskeletal: No worrisome lytic or sclerotic lesions.  IMPRESSION: 1. Worsening acute pancreatitis with evidence of necrosis in the pancreatic body and tail. 2. Fluid collection in the gallbladder fossa. Difficult to exclude a biloma or abscess after cholecystectomy. 3. Scattered peripancreatic and retroperitoneal adenopathy, most likely reactive. 4. Small left pleural effusion with airspace disease in the left lower lobe. Difficult to exclude pneumonia.   Electronically Signed   By: Lorin Picket M.D.   On: 04/07/2014 12:49     EKG Interpretation   Date/Time:  Tuesday April 07 2014 10:05:38 EST Ventricular Rate:  95 PR Interval:  138 QRS Duration: 84 QT Interval:  387 QTC Calculation:  486 R Axis:   -34 Text Interpretation:  Sinus rhythm Probable left atrial enlargement Left  axis deviation Abnormal R-wave progression, early transition Confirmed by  Jeneen Rinks  MD, [place] ([id number]) on 04/07/2014 11:03:10 AM      MDM   Final diagnoses:  Chest pain  Abdominal pain  Other acute pancreatitis  Ileus  Fever, unspecified fever cause    After initial history and physical I spent a great deal of time with the patient, particularly with his wife. She essentially was refusing CT scanning. She stated "were just here to have a gastrologist put that scope down there and see why his chest hurts so bad". I discussed with her my concerns about postoperative infection, complications, bile leak, pseudocyst, pancreatic necrosis or abscess. I discussed with her that this may be related to a gastritis but that would be more of a diagnosis of exclusion today based on the above  concerns. I placed a call the patient's primary care physician Dr. Army Chaco. Dr. Orland Mustard and I discussed Mr. Arizpe. He was in agreement need for imaging, and thus had ordered the CT yesterday that the patient's wife had declined.    I spent approximately tender 15 minutes and made a balloon drawing of the diaphragms, stomach, liver, pancreas, and lungs in an attempt to help her understand my concerns.  Ultimately, she reluctantly remits to allowing CT scan.  Diagnostic considerations would include worsening or continued pancreatitis, bile leak, biloma, pseudocyst or pancreatic abscess. Gastritis due to Augmentin, pneumonia, pleural fluid.  13:30:  Recheck temp 101.7. CT findings noted. Increasing abnormality as but the pancreas with edema and focal areas of possible necrosis. Possible left lower lobe infiltrate. Gallbladder fossa fluid collection.    Blood cultures and lactate requested. I discussed the case with general surgery. Hiatus scan ordered. Patient will be seen by general surgery consult. Hopefully not biloma or bile leak.  He has ileus on exam with distention and hypoactive bowel sounds. He has no peritoneal irritation guarding or rebound. He is not tachypneic or hypoxemic. He will car mission for further evaluation and treatment including bowel rest and IV fluids and symptom control. With Thorazine his hiccups have Resolved.      Tanna Furry, MD 04/07/14 1351

## 2014-04-07 NOTE — ED Notes (Signed)
Patient not in room at this time have test done.

## 2014-04-07 NOTE — Plan of Care (Signed)
Problem: Phase I Progression Outcomes Goal: Voiding-avoid urinary catheter unless indicated Outcome: Completed/Met Date Met:  04/07/14     

## 2014-04-07 NOTE — ED Notes (Signed)
Admitting Physician at the bedside.  

## 2014-04-07 NOTE — Progress Notes (Signed)
Pt arrived to 3E06 from ED A&0x4.  Oriented to room .  Call bell at reach.  Instructed to call for assistance.  Verbalized understinding.  Will continue to monitor.  Sheronda Parran, Therapist, sports.

## 2014-04-07 NOTE — ED Notes (Signed)
Patient returned from CT

## 2014-04-08 DIAGNOSIS — A419 Sepsis, unspecified organism: Secondary | ICD-10-CM

## 2014-04-08 DIAGNOSIS — K851 Biliary acute pancreatitis: Secondary | ICD-10-CM

## 2014-04-08 LAB — LIPASE, BLOOD: Lipase: 57 U/L (ref 11–59)

## 2014-04-08 LAB — BASIC METABOLIC PANEL
Anion gap: 13 (ref 5–15)
BUN: 10 mg/dL (ref 6–23)
CO2: 24 mEq/L (ref 19–32)
Calcium: 7.9 mg/dL — ABNORMAL LOW (ref 8.4–10.5)
Chloride: 105 mEq/L (ref 96–112)
Creatinine, Ser: 1.17 mg/dL (ref 0.50–1.35)
GFR calc Af Amer: 73 mL/min — ABNORMAL LOW (ref >90)
GFR calc non Af Amer: 63 mL/min — ABNORMAL LOW (ref >90)
Glucose, Bld: 79 mg/dL (ref 70–99)
Potassium: 4.2 mEq/L (ref 3.7–5.3)
Sodium: 142 mEq/L (ref 137–147)

## 2014-04-08 LAB — CBC
HCT: 28.4 % — ABNORMAL LOW (ref 39.0–52.0)
Hemoglobin: 9.1 g/dL — ABNORMAL LOW (ref 13.0–17.0)
MCH: 30.2 pg (ref 26.0–34.0)
MCHC: 32 g/dL (ref 30.0–36.0)
MCV: 94.4 fL (ref 78.0–100.0)
Platelets: 489 10*3/uL — ABNORMAL HIGH (ref 150–400)
RBC: 3.01 MIL/uL — ABNORMAL LOW (ref 4.22–5.81)
RDW: 14.5 % (ref 11.5–15.5)
WBC: 13.6 10*3/uL — ABNORMAL HIGH (ref 4.0–10.5)

## 2014-04-08 LAB — HEPATIC FUNCTION PANEL
ALT: 45 U/L (ref 0–53)
AST: 33 U/L (ref 0–37)
Albumin: 2.2 g/dL — ABNORMAL LOW (ref 3.5–5.2)
Alkaline Phosphatase: 65 U/L (ref 39–117)
Bilirubin, Direct: 0.3 mg/dL (ref 0.0–0.3)
Indirect Bilirubin: 0.5 mg/dL (ref 0.3–0.9)
Total Bilirubin: 0.8 mg/dL (ref 0.3–1.2)
Total Protein: 6.2 g/dL (ref 6.0–8.3)

## 2014-04-08 LAB — PROTIME-INR
INR: 1.36 (ref 0.00–1.49)
Prothrombin Time: 16.9 seconds — ABNORMAL HIGH (ref 11.6–15.2)

## 2014-04-08 MED ORDER — FAMOTIDINE IN NACL 20-0.9 MG/50ML-% IV SOLN
20.0000 mg | Freq: Two times a day (BID) | INTRAVENOUS | Status: DC
  Administered 2014-04-08 – 2014-04-12 (×10): 20 mg via INTRAVENOUS
  Filled 2014-04-08 (×14): qty 50

## 2014-04-08 MED ORDER — CHLORPROMAZINE HCL 25 MG/ML IJ SOLN
25.0000 mg | Freq: Once | INTRAMUSCULAR | Status: AC
  Administered 2014-04-08: 25 mg via INTRAMUSCULAR
  Filled 2014-04-08: qty 1

## 2014-04-08 MED ORDER — ALUM & MAG HYDROXIDE-SIMETH 200-200-20 MG/5ML PO SUSP
15.0000 mL | ORAL | Status: DC | PRN
  Administered 2014-04-08 – 2014-04-13 (×2): 15 mL via ORAL
  Filled 2014-04-08 (×2): qty 30

## 2014-04-08 NOTE — Progress Notes (Signed)
Instructed by RT that pt c/o of pain and wanted some pain med.  On assessment pt voiced he is having epigastric pain and burping and does not want tylenol or something by mouth as was instructed yesterday that he should not have anything by mouth.  Informed pt that MD yesterday said ok to give meds by mouth with sip of H20.  Informed Dr. Ulyses Jarred and instructed is ok to give meds with sip of H20 and will other additional med to help pt..  Pt made aware and verbalized understanding.  Will continue to monitor.

## 2014-04-08 NOTE — Progress Notes (Signed)
[facility] Progress Note   [name] OHY:[id number] DOB: 1946/02/02 DOA: 04/07/2014 PCP: London Pepper, MD  Brief narrative: Mason [name] is a 68 y.o. male status post laparoscopic cholecystectomy on 03/29/14 for gallstone pancreatitis. Treated with Primaxin in the hospital and discharged home on Augmentin. The patient returns with a complaint of fever for 48 hours and epigastric pain and is found to have necrosis of the pancreatic tail and related sepsis.   Subjective: Having some epigastric pain precipitated by hi-ccups. No significant nausea  Assessment/Plan: Principal Problem:   Sepsis due to pancreatic necrosis -Continue Zosyn, follow temperature curve and WBC count  -maintain NPO except meds -Surgery has evaluated the patient and at this time there is no urgent need for surgical  Active Problems: Recent cholecystectomy -Ultrasound reveals no bile leak   Code Status: Full code Family Communication: None Disposition Plan: Home when stable DVT prophylaxis: Heparin  Consultants: surgery  Procedures: none  Antibiotics: Anti-infectives    Start     Dose/Rate Route Frequency Ordered Stop   04/08/14 0200  vancomycin (VANCOCIN) 1,250 mg in sodium chloride 0.9 % 250 mL IVPB     1,250 mg166.7 mL/hr over 90 Minutes Intravenous Every 12 hours 04/07/14 1413     04/08/14 0200  piperacillin-tazobactam (ZOSYN) IVPB 3.375 g     3.375 g12.5 mL/hr over 240 Minutes Intravenous Every 8 hours 04/07/14 2153     04/07/14 1400  piperacillin-tazobactam (ZOSYN) IVPB 3.375 g     3.375 g100 mL/hr over 30 Minutes Intravenous  Once 04/07/14 1347 04/07/14 1737   04/07/14 1400  vancomycin (VANCOCIN) 2,000 mg in sodium chloride 0.9 % 500 mL IVPB     2,000 mg250 mL/hr over 120 Minutes Intravenous  Once 04/07/14 1354 04/07/14 1914         Objective: Filed Weights   04/07/14 0928 04/07/14 1744 04/08/14 0528  Weight: 92.987 kg (205 lb) 98.8 kg (217 lb 13 oz) 90.5 kg (199 lb 8.3 oz)     Intake/Output Summary (Last 24 hours) at 04/08/14 1602 Last data filed at 04/08/14 1409  Gross per 24 hour  Intake   1400 ml  Output   1575 ml  Net   -175 ml     Vitals Filed Vitals:   04/08/14 0528 04/08/14 0755 04/08/14 0902 04/08/14 1410  BP: 110/57  104/62 118/59  Pulse: 96  104 111  Temp: 100.7 F (38.2 C)  100.8 F (38.2 C) 102.6 F (39.2 C)  TempSrc: Oral  Oral Oral  Resp: 20   20  Height:      Weight: 90.5 kg (199 lb 8.3 oz)     SpO2: 95% 96%  93%    Exam: General: No acute respiratory distress Lungs: Clear to auscultation bilaterally without wheezes or crackles Cardiovascular: Regular rate and rhythm without murmur gallop or rub normal S1 and S2 Abdomen: epigastric tenderness, nondistended, soft, bowel sounds positive, no rebound, no ascites, no appreciable mass Extremities: No significant cyanosis, clubbing, or edema bilateral lower extremities  Data Reviewed: Basic Metabolic Panel:  Recent Labs Lab 04/02/14 0854 04/07/14 1014 04/08/14 0454  NA 140 138 142  K 3.0* 3.5* 4.2  CL 104 100 105  CO2 25 23 24   GLUCOSE 113* 115* 79  BUN 12 10 10   CREATININE 1.04 1.08 1.17  CALCIUM 8.1* 8.3* 7.9*   Liver Function Tests:  Recent Labs Lab 04/02/14 0854 04/07/14 1014 04/08/14 0454  AST 38* 54* 33  ALT 42 65* 45  ALKPHOS 66 57 65  BILITOT 0.9 0.8 0.8  PROT 6.1 6.9 6.2  ALBUMIN 2.3* 2.4* 2.2*    Recent Labs Lab 04/02/14 0854 04/07/14 1014 04/08/14 0454  LIPASE 77* 60* 57   No results for input(s): AMMONIA in the last 168 hours. CBC:  Recent Labs Lab 04/01/14 1605 04/07/14 1014 04/08/14 0454  WBC 10.5 14.3* 13.6*  NEUTROABS  --  12.6*  --   HGB 10.2* 10.1* 9.1*  HCT 29.4* 30.2* 28.4*  MCV 92.5 92.4 94.4  PLT 225 476* 489*   Cardiac Enzymes:  Recent Labs Lab 04/07/14 1014  TROPONINI <0.30   BNP (last 3 results)  Recent Labs  07/10/13 0903  PROBNP 208.5*   CBG: No results for input(s): GLUCAP in the last 168  hours.  Recent Results (from the past 240 hour(s))  Clostridium Difficile by PCR     Status: None   Collection Time: 03/31/14  9:33 AM  Result Value Ref Range Status   C difficile by pcr NEGATIVE NEGATIVE Final     Studies:  Recent x-ray studies have been reviewed in detail by the Attending Physician  Scheduled Meds:  Scheduled Meds: . famotidine (PEPCID) IV  20 mg Intravenous Q12H  . fluticasone  1 puff Inhalation BID  . heparin  5,000 Units Subcutaneous 3 times per day  . pantoprazole  40 mg Oral Daily  . piperacillin-tazobactam (ZOSYN)  IV  3.375 g Intravenous Q8H  . sodium chloride  3 mL Intravenous Q12H  . vancomycin  1,250 mg Intravenous Q12H   Continuous Infusions: . sodium chloride 100 mL/hr at 04/07/14 2156    Time spent on care of this patient: 35 min  [place], MD 04/08/2014, 4:02 PM  LOS: 1 day   [facility] Office  [phone] Pager - Text Page per [email or web address]  If 7PM-7AM, please contact night-coverage [email or web address]

## 2014-04-08 NOTE — Progress Notes (Signed)
INITIAL NUTRITION ASSESSMENT  DOCUMENTATION CODES Per approved criteria  -Non-severe (moderate) malnutrition in the context of acute illness or injury  Pt meets criteria for NON-SEVERE (MODERATE) MALNUTRITION in the context of ACUTE ILLNESS/INJURY as evidenced by 7% weight loss in less than 2 weeks, estimated energy intake <75% of estimated energy needs for >7 days, and mild muscle wasting.  INTERVENTION: Diet advancement per MD Recommend Resource Breeze BID if PO intake remains poor RD to continue to monitor for PO adequacy   NUTRITION DIAGNOSIS: Inadequate oral intake related to pain and nausea as evidenced by 7% weight loss in less than 2 weeks.   Goal: Pt to meet >/= 90% of their estimated nutrition needs   Monitor:  Diet advancement, PO intake, weight trend, labs  Reason for Assessment: Malnutrition Screening Tool, score of 2  68 y.o. male  Admitting Dx: Sepsis  ASSESSMENT: 68 y.o. male, was recently discharged from [facility] on 04/02/14 , status post gallstone pancreatitis, with laparoscopic cholecystectomy on 03/29/14. Patient has been complaining of fever over the last 48 hours, started to complain of epigastric abdominal pain, as well started to have belching, headache. In ED patient was found to be afebrile, but had leukocytosis of 14,000, with tachycardia, patient had CT abdomen and pelvis done which was significant for fluid collection at the gallbladder fossa and worsening acute pancreatitis with evidence of pancreatic necrosis in body and tail, as well study was suspicious for left lower lobe pneumonia.  Pt reports having a poor appetite and eating about 75% less than usual since his surgery on 03/29/14. He states that prior to surgery he was eating well and maintaining his weight at 213 lbs. He is NPO today and continues to complain of pain.  Nutrition Focused Physical Exam:  Subcutaneous Fat:  Orbital Region: wnl Upper Arm Region: mild wasting Thoracic  and Lumbar Region: NA  Muscle:  Temple Region: wnl Clavicle Bone Region: moderate wasting Clavicle and Acromion Bone Region: mild wasting Scapular Bone Region: NA Dorsal Hand: WNL Patellar Region: moderate wasting Anterior Thigh Region: moderate wasting Posterior Calf Region: WNL  Edema: none noted   Height: Ht Readings from Last 1 Encounters:  04/07/14 6' (1.829 m)    Weight: Wt Readings from Last 1 Encounters:  04/08/14 199 lb 8.3 oz (90.5 kg)    Ideal Body Weight: 178 lbs  % Ideal Body Weight: 112%  Wt Readings from Last 10 Encounters:  04/08/14 199 lb 8.3 oz (90.5 kg)  04/01/14 207 lb (93.895 kg)  07/08/13 210 lb (95.255 kg)    Usual Body Weight: 213 lbs  % Usual Body Weight: 93%  BMI:  Body mass index is 27.05 kg/(m^2).  Estimated Nutritional Needs: Kcal: 2100-2300 Protein: 100 grams Fluid: 2.1-2.3 L/day  Skin: closed abdominal incision  Diet Order: Diet NPO time specified Except for: Sips with Meds  EDUCATION NEEDS: -No education needs identified at this time   Intake/Output Summary (Last 24 hours) at 04/08/14 1245 Last data filed at 04/08/14 0842  Gross per 24 hour  Intake   1400 ml  Output   1250 ml  Net    150 ml    Last BM: 11/16   Labs:   Recent Labs Lab 04/02/14 0854 04/07/14 1014 04/08/14 0454  NA 140 138 142  K 3.0* 3.5* 4.2  CL 104 100 105  CO2 25 23 24   BUN 12 10 10   CREATININE 1.04 1.08 1.17  CALCIUM 8.1* 8.3* 7.9*  GLUCOSE 113* 115* 79  CBG (last 3)  No results for input(s): GLUCAP in the last 72 hours.  Scheduled Meds: . famotidine (PEPCID) IV  20 mg Intravenous Q12H  . fluticasone  1 puff Inhalation BID  . heparin  5,000 Units Subcutaneous 3 times per day  . pantoprazole  40 mg Oral Daily  . piperacillin-tazobactam (ZOSYN)  IV  3.375 g Intravenous Q8H  . sodium chloride  3 mL Intravenous Q12H  . vancomycin  1,250 mg Intravenous Q12H    Continuous Infusions: . sodium chloride 100 mL/hr at 04/07/14  2156    Past Medical History  Diagnosis Date  . Pancreatitis, acute 07/08/2013  . Gout attack 1990  . Seasonal allergies   . Seasonal asthma     "related to allergies"  . GERD (gastroesophageal reflux disease)     Past Surgical History  Procedure Laterality Date  . Circumcision  ~ 1974  . Eye examination under anesthesia w/ retinal cryotherapy and retinal laser Right 2014    "for tear"  . Cholecystectomy N/A 03/29/2014    Procedure: LAPAROSCOPIC CHOLECYSTECTOMY WITH INTRAOPERATIVE CHOLANGIOGRAM;  Surgeon: Coralie Keens, MD;  Location: [place];  Service: General;  Laterality: N/A;    Pryor Ochoa RD, LDN Inpatient Clinical Dietitian Pager: [phone] After Hours Pager: [phone]

## 2014-04-08 NOTE — Progress Notes (Signed)
Patient ID: Mason [name], male   DOB: 1946-02-04, 68 y.o.   MRN: [id number]    Subjective: Pt feels ok today.  Having some hiccups still and burps.  Some epigastric abd pain.  Objective: Vital signs in last 24 hours: Temp:  [98.2 F (36.8 C)-101.1 F (38.4 C)] 100.7 F (38.2 C) (11/18 0528) Pulse Rate:  [84-110] 96 (11/18 0528) Resp:  [17-31] 20 (11/18 0528) BP: (91-123)/(44-73) 110/57 mmHg (11/18 0528) SpO2:  [91 %-98 %] 96 % (11/18 0755) Weight:  [199 lb 8.3 oz (90.5 kg)-217 lb 13 oz (98.8 kg)] 199 lb 8.3 oz (90.5 kg) (11/18 0528) Last BM Date: 04/06/14  Intake/Output from previous day: 11/17 0701 - 11/18 0700 In: 1400 [I.V.:1100; IV Piggyback:300] Out: 1250 [Urine:1250] Intake/Output this shift:    PE: Abd: soft, mild tenderness in epigastrium and LUQ, +BS, ND  Lab Results:   Recent Labs  04/07/14 1014 04/08/14 0454  WBC 14.3* 13.6*  HGB 10.1* 9.1*  HCT 30.2* 28.4*  PLT 476* 489*   BMET  Recent Labs  04/07/14 1014 04/08/14 0454  NA 138 142  K 3.5* 4.2  CL 100 105  CO2 23 24  GLUCOSE 115* 79  BUN 10 10  CREATININE 1.08 1.17  CALCIUM 8.3* 7.9*   PT/INR  Recent Labs  04/08/14 0454  LABPROT 16.9*  INR 1.36   CMP     Component Value Date/Time   NA 142 04/08/2014 0454   K 4.2 04/08/2014 0454   CL 105 04/08/2014 0454   CO2 24 04/08/2014 0454   GLUCOSE 79 04/08/2014 0454   BUN 10 04/08/2014 0454   CREATININE 1.17 04/08/2014 0454   CALCIUM 7.9* 04/08/2014 0454   PROT 6.2 04/08/2014 0454   ALBUMIN 2.2* 04/08/2014 0454   AST 33 04/08/2014 0454   ALT 45 04/08/2014 0454   ALKPHOS 65 04/08/2014 0454   BILITOT 0.8 04/08/2014 0454   GFRNONAA 63* 04/08/2014 0454   GFRAA 73* 04/08/2014 0454   Lipase     Component Value Date/Time   LIPASE 57 04/08/2014 0454       Studies/Results: Dg Chest 2 View  04/07/2014   CLINICAL DATA:  Short of breath with exertion  EXAM: CHEST  2 VIEW  COMPARISON:  Chest x-ray of 03/31/2014  FINDINGS: Streaky opacity  remains primarily at the left lung base which may represent scarring, atelectasis, or possibly residual pneumonia. The right lung is clear. The heart is within normal limits in size. No mediastinal or hilar abnormality is seen. No bony abnormality is noted.  IMPRESSION: Persistent left basilar opacity may represent atelectasis, scarring, or possible residual pneumonia.   Electronically Signed   By: Ivar Drape M.D.   On: 04/07/2014 13:38   Nm Hepatobiliary Liver Func  04/07/2014   CLINICAL DATA:  68 year old male status post cholecystectomy on 03/29/2014. Fluid collection within the gallbladder fossa concerning for possible biliary leak.  EXAM: NUCLEAR MEDICINE HEPATOBILIARY IMAGING  TECHNIQUE: Sequential images of the abdomen were obtained out to 60 minutes following intravenous administration of radiopharmaceutical.  RADIOPHARMACEUTICALS:  5 Millic[employer]ie VF-64P Choletec  COMPARISON:  CT scan of the abdomen and pelvis 04/07/2014  FINDINGS: No evidence of abnormal collection of radiotracer in the gallbladder fossa or within knee peritoneum to suggest bile leak. There is a marked reflux of radiotracer from the proximal duodenum into the stomach. Ultimately, radiotracer does progress through into the small bowel.  IMPRESSION: 1. Negative for evidence of biliary leak. 2. Probable partial functional obstruction of the transverse  duodenum. Marked reflux of excreted radiotracer from the duodenum into the stomach. Ultimately, radiotracer does progress into the small bowel. This is not unexpected the giving the extensive secondary inflammatory changes of the descending and transverse duodenum seen on the CT scan from earlier today.   Electronically Signed   By: Jacqulynn Cadet M.D.   On: 04/07/2014 16:56   Ct Abdomen Pelvis W Contrast  04/07/2014   CLINICAL DATA:  Hiccups for 5 days with acid reflux. Recent gallbladder surgery.  EXAM: CT ABDOMEN AND PELVIS WITH CONTRAST  TECHNIQUE: Multidetector CT imaging of the  abdomen and pelvis was performed using the standard protocol following bolus administration of intravenous contrast.  CONTRAST:  155mL OMNIPAQUE IOHEXOL 300 MG/ML  SOLN  COMPARISON:  03/28/2014  FINDINGS: Lower chest: Lung bases show mild airspace disease in the lower lobes, left greater than right, with a small left pleural effusion. Heart size within normal limits. No pericardial effusion.  Hepatobiliary: 4 mm low-attenuation lesion in the left hepatic lobe is unchanged and likely a cyst or hemangioma. A low-attenuation fluid collection is seen in the gallbladder fossa after cholecystectomy, measuring 2.6 x 4.3 cm. Extrahepatic bile duct measures up to 7 mm with minimal intrahepatic biliary duct dilatation.  Pancreas: There are low attenuation areas in the pancreas, especially pancreatic tail, with an enlarged, edematous and heterogeneous pancreatic head and uncinate process. Findings are progressive from 03/28/2014. Surrounding peripancreatic inflammatory stranding and fluid.  Spleen: Negative.  Adrenals/Urinary Tract: Adrenal glands and right kidney are unremarkable. 1.3 cm fluid density lesion in the left kidney is unchanged and likely a cyst. 5 mm low-attenuation lesion in the lower pole left kidney is too small to characterize but also likely a cyst. Ureters are decompressed. Bladder is grossly unremarkable.  Stomach/Bowel: Stomach is unremarkable. Duodenal wall thickening is likely secondary to the adjacent pancreatic inflammatory changes. Small bowel, appendix and colon are otherwise unremarkable.  Vascular/Lymphatic: Scattered atherosclerotic calcification of the arterial vasculature without abdominal aortic aneurysm. Retroperitoneal lymph nodes measure up to 13 mm in the low left periaortic station, increased from 10 mm. Peripancreatic lymph nodes measure up to 11 mm, previously 9 mm.  Reproductive: Prostate is normal in size.  Other: Small bilateral inguinal hernias contain fat, left greater than right.  No free fluid. No free air.  Musculoskeletal: No worrisome lytic or sclerotic lesions.  IMPRESSION: 1. Worsening acute pancreatitis with evidence of necrosis in the pancreatic body and tail. 2. Fluid collection in the gallbladder fossa. Difficult to exclude a biloma or abscess after cholecystectomy. 3. Scattered peripancreatic and retroperitoneal adenopathy, most likely reactive. 4. Small left pleural effusion with airspace disease in the left lower lobe. Difficult to exclude pneumonia.   Electronically Signed   By: Lorin Picket M.D.   On: 04/07/2014 12:49    Anti-infectives: Anti-infectives    Start     Dose/Rate Route Frequency Ordered Stop   04/08/14 0200  vancomycin (VANCOCIN) 1,250 mg in sodium chloride 0.9 % 250 mL IVPB     1,250 mg166.7 mL/hr over 90 Minutes Intravenous Every 12 hours 04/07/14 1413     04/08/14 0200  piperacillin-tazobactam (ZOSYN) IVPB 3.375 g     3.375 g12.5 mL/hr over 240 Minutes Intravenous Every 8 hours 04/07/14 2153     04/07/14 1400  piperacillin-tazobactam (ZOSYN) IVPB 3.375 g     3.375 g100 mL/hr over 30 Minutes Intravenous  Once 04/07/14 1347 04/07/14 1737   04/07/14 1400  vancomycin (VANCOCIN) 2,000 mg in sodium chloride 0.9 % 500  mL IVPB     2,000 mg250 mL/hr over 120 Minutes Intravenous  Once 04/07/14 1354 04/07/14 1914       Assessment/Plan  1. Pancreatitis 2. S/p lap chole 3. Fevers  Plan: 1. HIDA scan is negative for leak or post op complications 2. He has some necrosis of his body and tail, which is likely the source for his fevers.  Would recommend NPO and continuing IV abx for now. 3. No surgical intervention.  Will follow.   LOS: 1 day    Jaqulyn Chancellor E 04/08/2014, 8:03 AM Pager: [phone]

## 2014-04-09 DIAGNOSIS — E440 Moderate protein-calorie malnutrition: Secondary | ICD-10-CM

## 2014-04-09 LAB — CBC
HCT: 25.1 % — ABNORMAL LOW (ref 39.0–52.0)
Hemoglobin: 8.2 g/dL — ABNORMAL LOW (ref 13.0–17.0)
MCH: 30.7 pg (ref 26.0–34.0)
MCHC: 32.7 g/dL (ref 30.0–36.0)
MCV: 94 fL (ref 78.0–100.0)
Platelets: 512 10*3/uL — ABNORMAL HIGH (ref 150–400)
RBC: 2.67 MIL/uL — ABNORMAL LOW (ref 4.22–5.81)
RDW: 14.5 % (ref 11.5–15.5)
WBC: 15.9 10*3/uL — ABNORMAL HIGH (ref 4.0–10.5)

## 2014-04-09 LAB — URINALYSIS, ROUTINE W REFLEX MICROSCOPIC
Glucose, UA: NEGATIVE mg/dL
Ketones, ur: 40 mg/dL — AB
Leukocytes, UA: NEGATIVE
Nitrite: NEGATIVE
Protein, ur: NEGATIVE mg/dL
Specific Gravity, Urine: 1.025 (ref 1.005–1.030)
Urobilinogen, UA: 1 mg/dL (ref 0.0–1.0)
pH: 5 (ref 5.0–8.0)

## 2014-04-09 LAB — URINE MICROSCOPIC-ADD ON

## 2014-04-09 NOTE — Clinical Documentation Improvement (Signed)
Please clarify Principal Problem. Thank you  Possible Conditions Sepsis due to pancreatic necrosis Necrotizing pancreatitis Other Not able to determine  Indictors: Conflicting documentation:  Dr. Waldron Labs H&P: Principal Problem:Sepsis Sepsis: She presents with tachycardia, leukocytosis, lactic acid still pending, has sepsis, most likely related to intra-abdominal process, related to pancreatic tail necrosis versus exostosis at the gallbladder fossa/biliary leak, with healthcare acquired pneumonia, blood cultures were sent, vision is being started on broad-spectrum IV antibiotics. 11/18 Principal Problem:  Sepsis due to pancreatic necrosis 11/19 Sepsis due to pancreatic necrosis -Continue Zosyn, continues to spike fevers with slight increase in WBC count  -maintain NPO except meds -Surgery has evaluated the patient and at this time there is no urgent need for surgical intervention  Per Dr. Georgette Dover 11/18: Pancreatitis with some necrosis (appears to be sterile, patient not septic) He has some necrosis of his body and tail, which is likely the source for his fevers. Would recommend NPO and continuing IV abx for now. 11/19 1. Patient continues to spike fevers as reactivity to his pancreatitis. This is sterile and there is no evidence of infection currently. His WBC is likely reactive to this inflammation as well.  Thank you,  Estella Husk ,RN Clinical Documentation Specialist:  [facility] [employer]

## 2014-04-09 NOTE — Progress Notes (Signed)
[facility] Progress Note   Mason [name] GXQ:[id number] DOB: July 27, 1945 DOA: 04/07/2014 PCP: London Pepper, MD  Brief narrative: Mason [name] is a 68 y.o. male status post laparoscopic cholecystectomy on 03/29/14 for gallstone pancreatitis. Treated with Primaxin in the hospital and discharged home on Augmentin. The patient returns with a complaint of fever for 48 hours and epigastric pain and is found to have necrosis of the pancreatic tail and related sepsis.   Subjective: Epigastric pain persists but is controlled with medications. No nausea.   Assessment/Plan: Principal Problem:   Sepsis due to pancreatic necrosis -Continue Zosyn, continues to spike fevers with slight increase in WBC count  -maintain NPO except meds -Surgery has evaluated the patient and at this time there is no urgent need for surgical intervention  Active Problems: Recent cholecystectomy -Ultrasound reveals no bile leak   Code Status: Full code Family Communication: None Disposition Plan: Home when stable DVT prophylaxis: Heparin  Consultants: surgery  Procedures: none  Antibiotics: Anti-infectives    Start     Dose/Rate Route Frequency Ordered Stop   04/08/14 0200  vancomycin (VANCOCIN) 1,250 mg in sodium chloride 0.9 % 250 mL IVPB  Status:  Discontinued     1,250 mg166.7 mL/hr over 90 Minutes Intravenous Every 12 hours 04/07/14 1413 04/08/14 1608   04/08/14 0200  piperacillin-tazobactam (ZOSYN) IVPB 3.375 g     3.375 g12.5 mL/hr over 240 Minutes Intravenous Every 8 hours 04/07/14 2153     04/07/14 1400  piperacillin-tazobactam (ZOSYN) IVPB 3.375 g     3.375 g100 mL/hr over 30 Minutes Intravenous  Once 04/07/14 1347 04/07/14 1737   04/07/14 1400  vancomycin (VANCOCIN) 2,000 mg in sodium chloride 0.9 % 500 mL IVPB     2,000 mg250 mL/hr over 120 Minutes Intravenous  Once 04/07/14 1354 04/07/14 1914         Objective: Filed Weights   04/07/14 1744 04/08/14 0528 04/09/14 0434  Weight:  98.8 kg (217 lb 13 oz) 90.5 kg (199 lb 8.3 oz) 89.5 kg (197 lb 5 oz)    Intake/Output Summary (Last 24 hours) at 04/09/14 1203 Last data filed at 04/09/14 1016  Gross per 24 hour  Intake   1360 ml  Output   1150 ml  Net    210 ml     Vitals Filed Vitals:   04/08/14 1717 04/08/14 2031 04/09/14 0434 04/09/14 0806  BP:  128/64 128/64 117/59  Pulse:  94 115 91  Temp: 100.1 F (37.8 C) 98.8 F (37.1 C) 101.7 F (38.7 C) 98.9 F (37.2 C)  TempSrc: Oral Oral Oral Oral  Resp:  20 20 20   Height:      Weight:   89.5 kg (197 lb 5 oz)   SpO2:  95% 94% 95%    Exam: General: No acute respiratory distress Lungs: Clear to auscultation bilaterally without wheezes or crackles Cardiovascular: Regular rate and rhythm without murmur gallop or rub normal S1 and S2 Abdomen: epigastric tenderness, nondistended, soft, bowel sounds positive, no rebound, no ascites, no appreciable mass Extremities: No significant cyanosis, clubbing, or edema bilateral lower extremities  Data Reviewed: Basic Metabolic Panel:  Recent Labs Lab 04/07/14 1014 04/08/14 0454  NA 138 142  K 3.5* 4.2  CL 100 105  CO2 23 24  GLUCOSE 115* 79  BUN 10 10  CREATININE 1.08 1.17  CALCIUM 8.3* 7.9*   Liver Function Tests:  Recent Labs Lab 04/07/14 1014 04/08/14 0454  AST 54* 33  ALT 65* 45  ALKPHOS 57  65  BILITOT 0.8 0.8  PROT 6.9 6.2  ALBUMIN 2.4* 2.2*    Recent Labs Lab 04/07/14 1014 04/08/14 0454  LIPASE 60* 57   No results for input(s): AMMONIA in the last 168 hours. CBC:  Recent Labs Lab 04/07/14 1014 04/08/14 0454 04/09/14 0545  WBC 14.3* 13.6* 15.9*  NEUTROABS 12.6*  --   --   HGB 10.1* 9.1* 8.2*  HCT 30.2* 28.4* 25.1*  MCV 92.4 94.4 94.0  PLT 476* 489* 512*   Cardiac Enzymes:  Recent Labs Lab 04/07/14 1014  TROPONINI <0.30   BNP (last 3 results)  Recent Labs  07/10/13 0903  PROBNP 208.5*   CBG: No results for input(s): GLUCAP in the last 168 hours.  Recent Results  (from the past 240 hour(s))  Clostridium Difficile by PCR     Status: None   Collection Time: 03/31/14  9:33 AM  Result Value Ref Range Status   C difficile by pcr NEGATIVE NEGATIVE Final  Culture, blood (routine x 2)     Status: None (Preliminary result)   Collection Time: 04/07/14  5:00 PM  Result Value Ref Range Status   Specimen Description BLOOD RIGHT HAND  Final   Special Requests BOTTLES DRAWN AEROBIC AND ANAEROBIC 5CC  Final   Culture  Setup Time   Final    04/08/2014 00:47 Performed at [facility]    Culture   Final           BLOOD CULTURE RECEIVED NO GROWTH TO DATE CULTURE WILL BE HELD FOR 5 DAYS BEFORE ISSUING A FINAL NEGATIVE REPORT Performed at [facility]    Report Status PENDING  Incomplete  Culture, blood (routine x 2)     Status: None (Preliminary result)   Collection Time: 04/07/14  5:00 PM  Result Value Ref Range Status   Specimen Description BLOOD LEFT ARM  Final   Special Requests BOTTLES DRAWN AEROBIC AND ANAEROBIC 5CC  Final   Culture  Setup Time   Final    04/08/2014 00:47 Performed at [facility]    Culture   Final           BLOOD CULTURE RECEIVED NO GROWTH TO DATE CULTURE WILL BE HELD FOR 5 DAYS BEFORE ISSUING A FINAL NEGATIVE REPORT Performed at [facility]    Report Status PENDING  Incomplete     Studies:  Recent x-ray studies have been reviewed in detail by the Attending Physician  Scheduled Meds:  Scheduled Meds: . famotidine (PEPCID) IV  20 mg Intravenous Q12H  . fluticasone  1 puff Inhalation BID  . heparin  5,000 Units Subcutaneous 3 times per day  . pantoprazole  40 mg Oral Daily  . piperacillin-tazobactam (ZOSYN)  IV  3.375 g Intravenous Q8H  . sodium chloride  3 mL Intravenous Q12H   Continuous Infusions: . sodium chloride 150 mL/hr at 04/09/14 0804    Time spent on care of this patient: 35 min  [place], MD 04/09/2014, 12:03 PM  LOS: 2 days   [facility] Office   [phone] Pager - Text Page per [email or web address]  If 7PM-7AM, please contact night-coverage [email or web address]

## 2014-04-09 NOTE — Progress Notes (Signed)
Patient ID: Mason [name], male   DOB: Dec 17, 1945, 68 y.o.   MRN: [id number]    Subjective: Pt feels ok.  Still has some epigastric abdominal pain.  No nausea.  Hiccups and burping much better  Objective: Vital signs in last 24 hours: Temp:  [98.8 F (37.1 C)-102.6 F (39.2 C)] 101.7 F (38.7 C) (11/19 0434) Pulse Rate:  [94-115] 115 (11/19 0434) Resp:  [20] 20 (11/19 0434) BP: (104-128)/(59-64) 128/64 mmHg (11/19 0434) SpO2:  [93 %-95 %] 94 % (11/19 0434) Weight:  [197 lb 5 oz (89.5 kg)] 197 lb 5 oz (89.5 kg) (11/19 0434) Last BM Date: 04/08/14  Intake/Output from previous day: 11/18 0701 - 11/19 0700 In: 1460 [I.V.:1310; IV Piggyback:150] Out: 1150 [Urine:1150] Intake/Output this shift:    PE: Abd: soft, minimally tender in epigastrium and LUQ, +BS, ND Heart: regular  Lab Results:   Recent Labs  04/08/14 0454 04/09/14 0545  WBC 13.6* 15.9*  HGB 9.1* 8.2*  HCT 28.4* 25.1*  PLT 489* 512*   BMET  Recent Labs  04/07/14 1014 04/08/14 0454  NA 138 142  K 3.5* 4.2  CL 100 105  CO2 23 24  GLUCOSE 115* 79  BUN 10 10  CREATININE 1.08 1.17  CALCIUM 8.3* 7.9*   PT/INR  Recent Labs  04/08/14 0454  LABPROT 16.9*  INR 1.36   CMP     Component Value Date/Time   NA 142 04/08/2014 0454   K 4.2 04/08/2014 0454   CL 105 04/08/2014 0454   CO2 24 04/08/2014 0454   GLUCOSE 79 04/08/2014 0454   BUN 10 04/08/2014 0454   CREATININE 1.17 04/08/2014 0454   CALCIUM 7.9* 04/08/2014 0454   PROT 6.2 04/08/2014 0454   ALBUMIN 2.2* 04/08/2014 0454   AST 33 04/08/2014 0454   ALT 45 04/08/2014 0454   ALKPHOS 65 04/08/2014 0454   BILITOT 0.8 04/08/2014 0454   GFRNONAA 63* 04/08/2014 0454   GFRAA 73* 04/08/2014 0454   Lipase     Component Value Date/Time   LIPASE 57 04/08/2014 0454       Studies/Results: Dg Chest 2 View  04/07/2014   CLINICAL DATA:  Short of breath with exertion  EXAM: CHEST  2 VIEW  COMPARISON:  Chest x-ray of 03/31/2014  FINDINGS: Streaky  opacity remains primarily at the left lung base which may represent scarring, atelectasis, or possibly residual pneumonia. The right lung is clear. The heart is within normal limits in size. No mediastinal or hilar abnormality is seen. No bony abnormality is noted.  IMPRESSION: Persistent left basilar opacity may represent atelectasis, scarring, or possible residual pneumonia.   Electronically Signed   By: Ivar Drape M.D.   On: 04/07/2014 13:38   Nm Hepatobiliary Liver Func  04/07/2014   CLINICAL DATA:  68 year old male status post cholecystectomy on 03/29/2014. Fluid collection within the gallbladder fossa concerning for possible biliary leak.  EXAM: NUCLEAR MEDICINE HEPATOBILIARY IMAGING  TECHNIQUE: Sequential images of the abdomen were obtained out to 60 minutes following intravenous administration of radiopharmaceutical.  RADIOPHARMACEUTICALS:  5 [employer] OI-71I Choletec  COMPARISON:  CT scan of the abdomen and pelvis 04/07/2014  FINDINGS: No evidence of abnormal collection of radiotracer in the gallbladder fossa or within knee peritoneum to suggest bile leak. There is a marked reflux of radiotracer from the proximal duodenum into the stomach. Ultimately, radiotracer does progress through into the small bowel.  IMPRESSION: 1. Negative for evidence of biliary leak. 2. Probable partial functional obstruction of the transverse  duodenum. Marked reflux of excreted radiotracer from the duodenum into the stomach. Ultimately, radiotracer does progress into the small bowel. This is not unexpected the giving the extensive secondary inflammatory changes of the descending and transverse duodenum seen on the CT scan from earlier today.   Electronically Signed   By: Jacqulynn Cadet M.D.   On: 04/07/2014 16:56   Ct Abdomen Pelvis W Contrast  04/07/2014   CLINICAL DATA:  Hiccups for 5 days with acid reflux. Recent gallbladder surgery.  EXAM: CT ABDOMEN AND PELVIS WITH CONTRAST  TECHNIQUE: Multidetector CT imaging  of the abdomen and pelvis was performed using the standard protocol following bolus administration of intravenous contrast.  CONTRAST:  158mL OMNIPAQUE IOHEXOL 300 MG/ML  SOLN  COMPARISON:  03/28/2014  FINDINGS: Lower chest: Lung bases show mild airspace disease in the lower lobes, left greater than right, with a small left pleural effusion. Heart size within normal limits. No pericardial effusion.  Hepatobiliary: 4 mm low-attenuation lesion in the left hepatic lobe is unchanged and likely a cyst or hemangioma. A low-attenuation fluid collection is seen in the gallbladder fossa after cholecystectomy, measuring 2.6 x 4.3 cm. Extrahepatic bile duct measures up to 7 mm with minimal intrahepatic biliary duct dilatation.  Pancreas: There are low attenuation areas in the pancreas, especially pancreatic tail, with an enlarged, edematous and heterogeneous pancreatic head and uncinate process. Findings are progressive from 03/28/2014. Surrounding peripancreatic inflammatory stranding and fluid.  Spleen: Negative.  Adrenals/Urinary Tract: Adrenal glands and right kidney are unremarkable. 1.3 cm fluid density lesion in the left kidney is unchanged and likely a cyst. 5 mm low-attenuation lesion in the lower pole left kidney is too small to characterize but also likely a cyst. Ureters are decompressed. Bladder is grossly unremarkable.  Stomach/Bowel: Stomach is unremarkable. Duodenal wall thickening is likely secondary to the adjacent pancreatic inflammatory changes. Small bowel, appendix and colon are otherwise unremarkable.  Vascular/Lymphatic: Scattered atherosclerotic calcification of the arterial vasculature without abdominal aortic aneurysm. Retroperitoneal lymph nodes measure up to 13 mm in the low left periaortic station, increased from 10 mm. Peripancreatic lymph nodes measure up to 11 mm, previously 9 mm.  Reproductive: Prostate is normal in size.  Other: Small bilateral inguinal hernias contain fat, left greater than  right. No free fluid. No free air.  Musculoskeletal: No worrisome lytic or sclerotic lesions.  IMPRESSION: 1. Worsening acute pancreatitis with evidence of necrosis in the pancreatic body and tail. 2. Fluid collection in the gallbladder fossa. Difficult to exclude a biloma or abscess after cholecystectomy. 3. Scattered peripancreatic and retroperitoneal adenopathy, most likely reactive. 4. Small left pleural effusion with airspace disease in the left lower lobe. Difficult to exclude pneumonia.   Electronically Signed   By: Lorin Picket M.D.   On: 04/07/2014 12:49    Anti-infectives: Anti-infectives    Start     Dose/Rate Route Frequency Ordered Stop   04/08/14 0200  vancomycin (VANCOCIN) 1,250 mg in sodium chloride 0.9 % 250 mL IVPB  Status:  Discontinued     1,250 mg166.7 mL/hr over 90 Minutes Intravenous Every 12 hours 04/07/14 1413 04/08/14 1608   04/08/14 0200  piperacillin-tazobactam (ZOSYN) IVPB 3.375 g     3.375 g12.5 mL/hr over 240 Minutes Intravenous Every 8 hours 04/07/14 2153     04/07/14 1400  piperacillin-tazobactam (ZOSYN) IVPB 3.375 g     3.375 g100 mL/hr over 30 Minutes Intravenous  Once 04/07/14 1347 04/07/14 1737   04/07/14 1400  vancomycin (VANCOCIN) 2,000 mg in sodium  chloride 0.9 % 500 mL IVPB     2,000 mg250 mL/hr over 120 Minutes Intravenous  Once 04/07/14 1354 04/07/14 1914       Assessment/Plan  1. Necrotizing pancreatitis 2. S/p lap chole for gallstone pancreatitis 3. Fevers, secondary to #1  Plan: 1. Patient continues to spike fevers as reactivity to his pancreatitis.  This is sterile and there is no evidence of infection currently.  His WBC is likely reactive to this inflammation as well. 2. Cont NPO x ice chips for now 3. The wife had a lot of questions.  I spend about 20 minutes answering these questions and showing her images per her request. 4. No surgical interventions planned or anticipated   LOS: 2 days    Shelitha Magley E 04/09/2014, 8:00  AM Pager: [phone]

## 2014-04-10 LAB — HEPATIC FUNCTION PANEL
ALT: 26 U/L (ref 0–53)
AST: 24 U/L (ref 0–37)
Albumin: 1.8 g/dL — ABNORMAL LOW (ref 3.5–5.2)
Alkaline Phosphatase: 56 U/L (ref 39–117)
Bilirubin, Direct: 0.3 mg/dL (ref 0.0–0.3)
Indirect Bilirubin: 0.8 mg/dL (ref 0.3–0.9)
Total Bilirubin: 1.1 mg/dL (ref 0.3–1.2)
Total Protein: 6 g/dL (ref 6.0–8.3)

## 2014-04-10 LAB — URINE CULTURE
Colony Count: NO GROWTH
Culture: NO GROWTH

## 2014-04-10 LAB — CBC
HCT: 25.4 % — ABNORMAL LOW (ref 39.0–52.0)
Hemoglobin: 8.4 g/dL — ABNORMAL LOW (ref 13.0–17.0)
MCH: 31.7 pg (ref 26.0–34.0)
MCHC: 33.1 g/dL (ref 30.0–36.0)
MCV: 95.8 fL (ref 78.0–100.0)
Platelets: 545 10*3/uL — ABNORMAL HIGH (ref 150–400)
RBC: 2.65 MIL/uL — ABNORMAL LOW (ref 4.22–5.81)
RDW: 14.6 % (ref 11.5–15.5)
WBC: 16.8 10*3/uL — ABNORMAL HIGH (ref 4.0–10.5)

## 2014-04-10 NOTE — Progress Notes (Signed)
LOS: 3 days   Subjective: Ambulatory in hall, appears comfortable. Three BMs overnight. Reports pain improving. No issues with nausea or vomiting.   Objective: Vital signs in last 24 hours: febrile over night, normotensive Temp:  [99 F (37.2 C)-101.5 F (38.6 C)] 99 F (37.2 C) (11/20 0559) Pulse Rate:  [89-102] 89 (11/20 0559) Resp:  [17-18] 18 (11/20 0559) BP: (118-132)/(61) 132/61 mmHg (11/20 0559) SpO2:  [93 %-97 %] 97 % (11/20 0752) Weight:  [89.676 kg (197 lb 11.2 oz)] 89.676 kg (197 lb 11.2 oz) (11/20 0559) Last BM Date: 04/10/14   Laboratory  CBC  Recent Labs  04/08/14 0454 04/09/14 0545  WBC 13.6* 15.9*  HGB 9.1* 8.2*  HCT 28.4* 25.1*  PLT 489* 512*   BMET  Recent Labs  04/07/14 1014 04/08/14 0454  NA 138 142  K 3.5* 4.2  CL 100 105  CO2 23 24  GLUCOSE 115* 79  BUN 10 10  CREATININE 1.08 1.17  CALCIUM 8.3* 7.9*     Physical Exam General appearance: alert, cooperative, appears stated age and no distress Eyes: no scleral icterus Resp: clear to auscultation bilaterally GI: NT with minimal distension. NABS. Very mild tenderness in epigastrium   Assessment: HD#3 1. Necrotizing pancreatitis 2. S/p lap chole for gallstone pancreatitis 3. Fever and leukocytosis, secondary to #1. Hemodynamically stable. 4. Mild anemia  Plan: 1. Patient continues to have febrile episodes as reactivity to his pancreatitis. This is sterile and there is no evidence of active infection. His leukocytosis is likely reactive to inflammation as well. May continue IV Abx.  2. Can advance diet to sips of clears 3. No planned or anticipated surgical interventions. 4. CBC, INR, LFTs pending   Lahoma Rocker, [facility] Pager [phone] 04/10/2014

## 2014-04-10 NOTE — Progress Notes (Signed)
ANTIBIOTIC CONSULT NOTE - FOLLOW UP  Pharmacy Consult for Zosyn Indication: Pneumonia  No Known Allergies  Patient Measurements: Height: 6' (182.9 cm) Weight: 197 lb 11.2 oz (89.676 kg) (a scale) IBW/kg (Calculated) : 77.6  Vital Signs: Temp: 99.8 F (37.7 C) (11/20 1037) Temp Source: Oral (11/20 1037) BP: 129/77 mmHg (11/20 1037) Pulse Rate: 98 (11/20 1037) Intake/Output from previous day: 11/19 0701 - 11/20 0700 In: 4851.7 [P.O.:90; I.V.:4611.7; IV Piggyback:150] Out: 725 [Urine:725] Intake/Output from this shift:    Labs:  Recent Labs  04/08/14 0454 04/09/14 0545 04/10/14 1037  WBC 13.6* 15.9* 16.8*  HGB 9.1* 8.2* 8.4*  PLT 489* 512* 545*  CREATININE 1.17  --   --    Estimated Creatinine Clearance: 67.2 mL/min (by C-G formula based on Cr of 1.17). No results for input(s): VANCOTROUGH, VANCOPEAK, VANCORANDOM, GENTTROUGH, GENTPEAK, GENTRANDOM, TOBRATROUGH, TOBRAPEAK, TOBRARND, AMIKACINPEAK, AMIKACINTROU, AMIKACIN in the last 72 hours.   Microbiology: Recent Results (from the past 720 hour(s))  Culture, blood (routine x 2)     Status: None   Collection Time: 03/27/14  9:10 AM  Result Value Ref Range Status   Specimen Description BLOOD RIGHT ANTECUBITAL  Final   Special Requests BOTTLES DRAWN AEROBIC AND ANAEROBIC 10CC  Final   Culture  Setup Time   Final    03/27/2014 15:41 Performed at [facility]    Culture   Final    NO GROWTH 5 DAYS Performed at [facility]    Report Status 04/02/2014 FINAL  Final  Culture, blood (routine x 2)     Status: None   Collection Time: 03/27/14  9:16 AM  Result Value Ref Range Status   Specimen Description BLOOD RIGHT HAND  Final   Special Requests BOTTLES DRAWN AEROBIC AND ANAEROBIC 10CC  Final   Culture  Setup Time   Final    03/27/2014 15:39 Performed at [facility]    Culture   Final    NO GROWTH 5 DAYS Performed at [facility]    Report Status 04/02/2014 FINAL  Final  Surgical  pcr screen     Status: None   Collection Time: 03/29/14 10:51 AM  Result Value Ref Range Status   MRSA, PCR NEGATIVE NEGATIVE Final   Staphylococcus aureus NEGATIVE NEGATIVE Final    Comment:        The Xpert SA Assay (FDA approved for NASAL specimens in patients over 61 years of age), is one component of a comprehensive surveillance program.  Test performance has been validated by [employer] for patients greater than or equal to 46 year old. It is not intended to diagnose infection nor to guide or monitor treatment.   Clostridium Difficile by PCR     Status: None   Collection Time: 03/31/14  9:33 AM  Result Value Ref Range Status   C difficile by pcr NEGATIVE NEGATIVE Final  Culture, blood (routine x 2)     Status: None (Preliminary result)   Collection Time: 04/07/14  5:00 PM  Result Value Ref Range Status   Specimen Description BLOOD RIGHT HAND  Final   Special Requests BOTTLES DRAWN AEROBIC AND ANAEROBIC 5CC  Final   Culture  Setup Time   Final    04/08/2014 00:47 Performed at [facility]    Culture   Final           BLOOD CULTURE RECEIVED NO GROWTH TO DATE CULTURE WILL BE HELD FOR 5 DAYS BEFORE ISSUING A FINAL NEGATIVE  REPORT Performed at [facility]    Report Status PENDING  Incomplete  Culture, blood (routine x 2)     Status: None (Preliminary result)   Collection Time: 04/07/14  5:00 PM  Result Value Ref Range Status   Specimen Description BLOOD LEFT ARM  Final   Special Requests BOTTLES DRAWN AEROBIC AND ANAEROBIC 5CC  Final   Culture  Setup Time   Final    04/08/2014 00:47 Performed at [facility]    Culture   Final           BLOOD CULTURE RECEIVED NO GROWTH TO DATE CULTURE WILL BE HELD FOR 5 DAYS BEFORE ISSUING A FINAL NEGATIVE REPORT Performed at [facility]    Report Status PENDING  Incomplete    Anti-infectives    Start     Dose/Rate Route Frequency Ordered Stop   04/08/14 0200  vancomycin (VANCOCIN) 1,250 mg in  sodium chloride 0.9 % 250 mL IVPB  Status:  Discontinued     1,250 mg166.7 mL/hr over 90 Minutes Intravenous Every 12 hours 04/07/14 1413 04/08/14 1608   04/08/14 0200  piperacillin-tazobactam (ZOSYN) IVPB 3.375 g     3.375 g12.5 mL/hr over 240 Minutes Intravenous Every 8 hours 04/07/14 2153     04/07/14 1400  piperacillin-tazobactam (ZOSYN) IVPB 3.375 g     3.375 g100 mL/hr over 30 Minutes Intravenous  Once 04/07/14 1347 04/07/14 1737   04/07/14 1400  vancomycin (VANCOCIN) 2,000 mg in sodium chloride 0.9 % 500 mL IVPB     2,000 mg250 mL/hr over 120 Minutes Intravenous  Once 04/07/14 1354 04/07/14 1914      Assessment: Pt is a 68 yo male on Zosyn for possible pneumonia. Cultures are negative to date. Pt is currently afebrile but has elevated WBC. Renal function appears to be at baseline.   Plan:  1. Continue zosyn 3.375gm IV Q8H (4 hr inf)  2. F/u renal fxn, C&S, clinical status  3. F/u cultures  Alita Chyle, PharmD Candidate 04/10/2014,11:07 AM

## 2014-04-10 NOTE — Progress Notes (Signed)
[facility] Progress Note   Carleton [place] NWG:[id number] DOB: April 19, 1946 DOA: 04/07/2014 PCP: London Pepper, MD  Brief narrative: Mason [name] is a 68 y.o. male status post laparoscopic cholecystectomy on 03/29/14 for gallstone pancreatitis. Treated with Primaxin in the hospital and discharged home on Augmentin. The patient returns with a complaint of fever for 48 hours and epigastric pain and is found to have necrosis of the pancreatic tail and related sepsis.   Subjective: Epigastric pain is improving. No new complaints-patient is having normal bowel movements  Assessment/Plan: Principal Problem:   Sepsis due to pancreatic necrosis -Continue Zosyn, fever curve is improving-follow up on WBC count tomorrow morning -allow sips of water today -Per surgery team, is no urgent need for surgical intervention  Active Problems: Recent cholecystectomy -Ultrasound reveals no bile leak   Code Status: Full code Family Communication: None Disposition Plan: Home when stable DVT prophylaxis: Heparin  Consultants: surgery  Procedures: none  Antibiotics: Anti-infectives    Start     Dose/Rate Route Frequency Ordered Stop   04/08/14 0200  vancomycin (VANCOCIN) 1,250 mg in sodium chloride 0.9 % 250 mL IVPB  Status:  Discontinued     1,250 mg166.7 mL/hr over 90 Minutes Intravenous Every 12 hours 04/07/14 1413 04/08/14 1608   04/08/14 0200  piperacillin-tazobactam (ZOSYN) IVPB 3.375 g     3.375 g12.5 mL/hr over 240 Minutes Intravenous Every 8 hours 04/07/14 2153     04/07/14 1400  piperacillin-tazobactam (ZOSYN) IVPB 3.375 g     3.375 g100 mL/hr over 30 Minutes Intravenous  Once 04/07/14 1347 04/07/14 1737   04/07/14 1400  vancomycin (VANCOCIN) 2,000 mg in sodium chloride 0.9 % 500 mL IVPB     2,000 mg250 mL/hr over 120 Minutes Intravenous  Once 04/07/14 1354 04/07/14 1914         Objective: Filed Weights   04/08/14 0528 04/09/14 0434 04/10/14 0559  Weight: 90.5 kg (199 lb 8.3  oz) 89.5 kg (197 lb 5 oz) 89.676 kg (197 lb 11.2 oz)    Intake/Output Summary (Last 24 hours) at 04/10/14 1441 Last data filed at 04/10/14 1350  Gross per 24 hour  Intake 4851.67 ml  Output   1025 ml  Net 3826.67 ml     Vitals Filed Vitals:   04/10/14 0559 04/10/14 0752 04/10/14 1037 04/10/14 1351  BP: 132/61  129/77 132/75  Pulse: 89  98 98  Temp: 99 F (37.2 C)  99.8 F (37.7 C) 100.8 F (38.2 C)  TempSrc: Oral  Oral Oral  Resp: 18  18 18   Height:      Weight: 89.676 kg (197 lb 11.2 oz)     SpO2: 96% 97% 96% 96%    Exam: General: No acute respiratory distress Lungs: Clear to auscultation bilaterally without wheezes or crackles Cardiovascular: Regular rate and rhythm without murmur gallop or rub normal S1 and S2 Abdomen: mild epigastric tenderness, nondistended, soft, bowel sounds positive, no rebound, no ascites, no appreciable mass Extremities: No significant cyanosis, clubbing, or edema bilateral lower extremities  Data Reviewed: Basic Metabolic Panel:  Recent Labs Lab 04/07/14 1014 04/08/14 0454  NA 138 142  K 3.5* 4.2  CL 100 105  CO2 23 24  GLUCOSE 115* 79  BUN 10 10  CREATININE 1.08 1.17  CALCIUM 8.3* 7.9*   Liver Function Tests:  Recent Labs Lab 04/07/14 1014 04/08/14 0454 04/10/14 1037  AST 54* 33 24  ALT 65* 45 26  ALKPHOS 57 65 56  BILITOT 0.8 0.8 1.1  PROT  6.9 6.2 6.0  ALBUMIN 2.4* 2.2* 1.8*    Recent Labs Lab 04/07/14 1014 04/08/14 0454  LIPASE 60* 57   No results for input(s): AMMONIA in the last 168 hours. CBC:  Recent Labs Lab 04/07/14 1014 04/08/14 0454 04/09/14 0545 04/10/14 1037  WBC 14.3* 13.6* 15.9* 16.8*  NEUTROABS 12.6*  --   --   --   HGB 10.1* 9.1* 8.2* 8.4*  HCT 30.2* 28.4* 25.1* 25.4*  MCV 92.4 94.4 94.0 95.8  PLT 476* 489* 512* 545*   Cardiac Enzymes:  Recent Labs Lab 04/07/14 1014  TROPONINI <0.30   BNP (last 3 results)  Recent Labs  07/10/13 0903  PROBNP 208.5*   CBG: No results for  input(s): GLUCAP in the last 168 hours.  Recent Results (from the past 240 hour(s))  Culture, blood (routine x 2)     Status: None (Preliminary result)   Collection Time: 04/07/14  5:00 PM  Result Value Ref Range Status   Specimen Description BLOOD RIGHT HAND  Final   Special Requests BOTTLES DRAWN AEROBIC AND ANAEROBIC 5CC  Final   Culture  Setup Time   Final    04/08/2014 00:47 Performed at [facility]    Culture   Final           BLOOD CULTURE RECEIVED NO GROWTH TO DATE CULTURE WILL BE HELD FOR 5 DAYS BEFORE ISSUING A FINAL NEGATIVE REPORT Performed at [facility]    Report Status PENDING  Incomplete  Culture, blood (routine x 2)     Status: None (Preliminary result)   Collection Time: 04/07/14  5:00 PM  Result Value Ref Range Status   Specimen Description BLOOD LEFT ARM  Final   Special Requests BOTTLES DRAWN AEROBIC AND ANAEROBIC 5CC  Final   Culture  Setup Time   Final    04/08/2014 00:47 Performed at [facility]    Culture   Final           BLOOD CULTURE RECEIVED NO GROWTH TO DATE CULTURE WILL BE HELD FOR 5 DAYS BEFORE ISSUING A FINAL NEGATIVE REPORT Performed at [facility]    Report Status PENDING  Incomplete  Culture, Urine     Status: None   Collection Time: 04/09/14  2:37 AM  Result Value Ref Range Status   Specimen Description URINE, RANDOM  Final   Special Requests NONE  Final   Culture  Setup Time   Final    04/09/2014 12:40 Performed at [place] Performed at [facility]   Final   Culture NO GROWTH Performed at [facility]   Final   Report Status 04/10/2014 FINAL  Final     Studies:  Recent x-ray studies have been reviewed in detail by the Attending Physician  Scheduled Meds:  Scheduled Meds: . famotidine (PEPCID) IV  20 mg Intravenous Q12H  . fluticasone  1 puff Inhalation BID  . heparin  5,000 Units Subcutaneous 3 times per day  . pantoprazole  40 mg  Oral Daily  . piperacillin-tazobactam (ZOSYN)  IV  3.375 g Intravenous Q8H  . sodium chloride  3 mL Intravenous Q12H   Continuous Infusions: . sodium chloride 150 mL/hr at 04/10/14 0715    Time spent on care of this patient: 48 min  [place], MD 04/10/2014, 2:41 PM  LOS: 3 days   [facility] Office  [phone] Pager - Text Page per [email or web address]  If 7PM-7AM, please  contact night-coverage [email or web address]

## 2014-04-11 LAB — CBC
HCT: 24.4 % — ABNORMAL LOW (ref 39.0–52.0)
Hemoglobin: 7.8 g/dL — ABNORMAL LOW (ref 13.0–17.0)
MCH: 29.9 pg (ref 26.0–34.0)
MCHC: 32 g/dL (ref 30.0–36.0)
MCV: 93.5 fL (ref 78.0–100.0)
Platelets: 547 10*3/uL — ABNORMAL HIGH (ref 150–400)
RBC: 2.61 MIL/uL — ABNORMAL LOW (ref 4.22–5.81)
RDW: 14.9 % (ref 11.5–15.5)
WBC: 15.1 10*3/uL — ABNORMAL HIGH (ref 4.0–10.5)

## 2014-04-11 LAB — BASIC METABOLIC PANEL
Anion gap: 18 — ABNORMAL HIGH (ref 5–15)
BUN: 11 mg/dL (ref 6–23)
CO2: 20 mEq/L (ref 19–32)
Calcium: 8 mg/dL — ABNORMAL LOW (ref 8.4–10.5)
Chloride: 110 mEq/L (ref 96–112)
Creatinine, Ser: 1 mg/dL (ref 0.50–1.35)
GFR calc Af Amer: 88 mL/min — ABNORMAL LOW (ref >90)
GFR calc non Af Amer: 76 mL/min — ABNORMAL LOW (ref >90)
Glucose, Bld: 94 mg/dL (ref 70–99)
Potassium: 3.5 mEq/L — ABNORMAL LOW (ref 3.7–5.3)
Sodium: 148 mEq/L — ABNORMAL HIGH (ref 137–147)

## 2014-04-11 MED ORDER — KCL IN DEXTROSE-NACL 20-5-0.45 MEQ/L-%-% IV SOLN
INTRAVENOUS | Status: DC
  Administered 2014-04-11: 16:00:00 via INTRAVENOUS
  Administered 2014-04-13: 1 mL via INTRAVENOUS
  Administered 2014-04-14: 14:00:00 via INTRAVENOUS
  Filled 2014-04-11 (×14): qty 1000

## 2014-04-11 NOTE — Plan of Care (Signed)
Problem: Phase I Progression Outcomes Goal: Hemodynamically stable Outcome: Progressing Client is afebrile and has been for 12 hours.

## 2014-04-11 NOTE — Plan of Care (Signed)
Problem: Phase III Progression Outcomes Goal: Activity at appropriate level-compared to baseline (UP IN CHAIR FOR HEMODIALYSIS)  Outcome: Adequate for Discharge Ambulated in hall and tolerated without difficulty.

## 2014-04-11 NOTE — Progress Notes (Signed)
[facility] Progress Note   Mason [name] ZOX:[id number] DOB: Sep 26, 1945 DOA: 04/07/2014 PCP: London Pepper, MD  Brief narrative: Mason [name] is a 68 y.o. male status post laparoscopic cholecystectomy on 03/29/14 for gallstone pancreatitis. Treated with Primaxin in the hospital and discharged home on Augmentin. The patient returns with a complaint of fever for 48 hours and epigastric pain and is found to have necrosis of the pancreatic tail and related sepsis.   Subjective: Epigastric pain continues to improve.   Assessment/Plan: Principal Problem:   Sepsis due to pancreatic necrosis -Continue Zosyn, fever curve is improving - persistent leukocytosis due to acute inflammation  -- start clears today -Per surgery team, is no urgent need for surgical intervention  Active Problems: Recent cholecystectomy -Ultrasound reveals no bile leak  Hypernatremia/ hypokalemia - change fluid to D51/2 NS with KCL   Code Status: Full code Family Communication: daily communication with wife Disposition Plan: Home when stable DVT prophylaxis: Heparin  Consultants: surgery  Procedures: none  Antibiotics: Anti-infectives    Start     Dose/Rate Route Frequency Ordered Stop   04/08/14 0200  vancomycin (VANCOCIN) 1,250 mg in sodium chloride 0.9 % 250 mL IVPB  Status:  Discontinued     1,250 mg166.7 mL/hr over 90 Minutes Intravenous Every 12 hours 04/07/14 1413 04/08/14 1608   04/08/14 0200  piperacillin-tazobactam (ZOSYN) IVPB 3.375 g     3.375 g12.5 mL/hr over 240 Minutes Intravenous Every 8 hours 04/07/14 2153     04/07/14 1400  piperacillin-tazobactam (ZOSYN) IVPB 3.375 g     3.375 g100 mL/hr over 30 Minutes Intravenous  Once 04/07/14 1347 04/07/14 1737   04/07/14 1400  vancomycin (VANCOCIN) 2,000 mg in sodium chloride 0.9 % 500 mL IVPB     2,000 mg250 mL/hr over 120 Minutes Intravenous  Once 04/07/14 1354 04/07/14 1914         Objective: Filed Weights   04/09/14 0434 04/10/14  0559 04/11/14 0410  Weight: 89.5 kg (197 lb 5 oz) 89.676 kg (197 lb 11.2 oz) 89.223 kg (196 lb 11.2 oz)    Intake/Output Summary (Last 24 hours) at 04/11/14 1152 Last data filed at 04/11/14 1108  Gross per 24 hour  Intake    360 ml  Output    200 ml  Net    160 ml     Vitals Filed Vitals:   04/10/14 1351 04/10/14 2005 04/11/14 0410 04/11/14 0931  BP: 132/75 121/59 127/70 126/70  Pulse: 98 95 96 88  Temp: 100.8 F (38.2 C) 98.6 F (37 C) 99.1 F (37.3 C) 98.4 F (36.9 C)  TempSrc: Oral Oral Oral Oral  Resp: 18 18 18 16   Height:      Weight:   89.223 kg (196 lb 11.2 oz)   SpO2: 96% 97% 95% 97%    Exam: General: AAO x 3, No acute respiratory distress Lungs: Clear to auscultation bilaterally without wheezes or crackles Cardiovascular: Regular rate and rhythm without murmur gallop or rub normal S1 and S2 Abdomen: mild epigastric tenderness, nondistended, soft, bowel sounds positive, no rebound, no ascites, no appreciable mass Extremities: No significant cyanosis, clubbing, or edema bilateral lower extremities  Data Reviewed: Basic Metabolic Panel:  Recent Labs Lab 04/07/14 1014 04/08/14 0454 04/11/14 0425  NA 138 142 148*  K 3.5* 4.2 3.5*  CL 100 105 110  CO2 23 24 20   GLUCOSE 115* 79 94  BUN 10 10 11   CREATININE 1.08 1.17 1.00  CALCIUM 8.3* 7.9* 8.0*   Liver Function Tests:  Recent Labs Lab 04/07/14 1014 04/08/14 0454 04/10/14 1037  AST 54* 33 24  ALT 65* 45 26  ALKPHOS 57 65 56  BILITOT 0.8 0.8 1.1  PROT 6.9 6.2 6.0  ALBUMIN 2.4* 2.2* 1.8*    Recent Labs Lab 04/07/14 1014 04/08/14 0454  LIPASE 60* 57   No results for input(s): AMMONIA in the last 168 hours. CBC:  Recent Labs Lab 04/07/14 1014 04/08/14 0454 04/09/14 0545 04/10/14 1037 04/11/14 0425  WBC 14.3* 13.6* 15.9* 16.8* 15.1*  NEUTROABS 12.6*  --   --   --   --   HGB 10.1* 9.1* 8.2* 8.4* 7.8*  HCT 30.2* 28.4* 25.1* 25.4* 24.4*  MCV 92.4 94.4 94.0 95.8 93.5  PLT 476* 489* 512*  545* 547*   Cardiac Enzymes:  Recent Labs Lab 04/07/14 1014  TROPONINI <0.30   BNP (last 3 results)  Recent Labs  07/10/13 0903  PROBNP 208.5*   CBG: No results for input(s): GLUCAP in the last 168 hours.  Recent Results (from the past 240 hour(s))  Culture, blood (routine x 2)     Status: None (Preliminary result)   Collection Time: 04/07/14  5:00 PM  Result Value Ref Range Status   Specimen Description BLOOD RIGHT HAND  Final   Special Requests BOTTLES DRAWN AEROBIC AND ANAEROBIC 5CC  Final   Culture  Setup Time   Final    04/08/2014 00:47 Performed at [facility]    Culture   Final           BLOOD CULTURE RECEIVED NO GROWTH TO DATE CULTURE WILL BE HELD FOR 5 DAYS BEFORE ISSUING A FINAL NEGATIVE REPORT Performed at [facility]    Report Status PENDING  Incomplete  Culture, blood (routine x 2)     Status: None (Preliminary result)   Collection Time: 04/07/14  5:00 PM  Result Value Ref Range Status   Specimen Description BLOOD LEFT ARM  Final   Special Requests BOTTLES DRAWN AEROBIC AND ANAEROBIC 5CC  Final   Culture  Setup Time   Final    04/08/2014 00:47 Performed at [facility]    Culture   Final           BLOOD CULTURE RECEIVED NO GROWTH TO DATE CULTURE WILL BE HELD FOR 5 DAYS BEFORE ISSUING A FINAL NEGATIVE REPORT Performed at [facility]    Report Status PENDING  Incomplete  Culture, Urine     Status: None   Collection Time: 04/09/14  2:37 AM  Result Value Ref Range Status   Specimen Description URINE, RANDOM  Final   Special Requests NONE  Final   Culture  Setup Time   Final    04/09/2014 12:40 Performed at [place] Performed at [facility]   Final   Culture NO GROWTH Performed at [facility]   Final   Report Status 04/10/2014 FINAL  Final     Studies:  Recent x-ray studies have been reviewed in detail by the Attending Physician  Scheduled  Meds:  Scheduled Meds: . famotidine (PEPCID) IV  20 mg Intravenous Q12H  . fluticasone  1 puff Inhalation BID  . heparin  5,000 Units Subcutaneous 3 times per day  . pantoprazole  40 mg Oral Daily  . piperacillin-tazobactam (ZOSYN)  IV  3.375 g Intravenous Q8H  . sodium chloride  3 mL Intravenous Q12H   Continuous Infusions: . sodium chloride 100 mL/hr at  04/11/14 1049    Time spent on care of this patient: 35 min  Ewing, MD 04/11/2014, 11:52 AM  LOS: 4 days   [facility] Office  [phone] Pager - Text Page per [email or web address]  If 7PM-7AM, please contact night-coverage [email or web address]

## 2014-04-11 NOTE — Progress Notes (Signed)
  Subjective: Had BM, no nausea, still some LUQ pain  Objective: Vital signs in last 24 hours: Temp:  [98.4 F (36.9 C)-100.8 F (38.2 C)] 98.4 F (36.9 C) (11/21 0931) Pulse Rate:  [88-98] 88 (11/21 0931) Resp:  [16-18] 16 (11/21 0931) BP: (121-132)/(59-77) 126/70 mmHg (11/21 0931) SpO2:  [95 %-97 %] 97 % (11/21 0931) Weight:  [196 lb 11.2 oz (89.223 kg)] 196 lb 11.2 oz (89.223 kg) (11/21 0410) Last BM Date: 04/10/14  Intake/Output from previous day: 11/20 0701 - 11/21 0700 In: 0  Out: 500 [Urine:500] Intake/Output this shift:    General appearance: cooperative Resp: clear to auscultation bilaterally Cardio: S1, S2 normal GI: soft, mild TTP LUQ  Lab Results:   Recent Labs  04/10/14 1037 04/11/14 0425  WBC 16.8* 15.1*  HGB 8.4* 7.8*  HCT 25.4* 24.4*  PLT 545* 547*   BMET  Recent Labs  04/11/14 0425  NA 148*  K 3.5*  CL 110  CO2 20  GLUCOSE 94  BUN 11  CREATININE 1.00  CALCIUM 8.0*   PT/INR No results for input(s): LABPROT, INR in the last 72 hours. ABG No results for input(s): PHART, HCO3 in the last 72 hours.  Invalid input(s): PCO2, PO2  Studies/Results: No results found.  Anti-infectives: Anti-infectives    Start     Dose/Rate Route Frequency Ordered Stop   04/08/14 0200  vancomycin (VANCOCIN) 1,250 mg in sodium chloride 0.9 % 250 mL IVPB  Status:  Discontinued     1,250 mg166.7 mL/hr over 90 Minutes Intravenous Every 12 hours 04/07/14 1413 04/08/14 1608   04/08/14 0200  piperacillin-tazobactam (ZOSYN) IVPB 3.375 g     3.375 g12.5 mL/hr over 240 Minutes Intravenous Every 8 hours 04/07/14 2153     04/07/14 1400  piperacillin-tazobactam (ZOSYN) IVPB 3.375 g     3.375 g100 mL/hr over 30 Minutes Intravenous  Once 04/07/14 1347 04/07/14 1737   04/07/14 1400  vancomycin (VANCOCIN) 2,000 mg in sodium chloride 0.9 % 500 mL IVPB     2,000 mg250 mL/hr over 120 Minutes Intravenous  Once 04/07/14 1354 04/07/14 1914       Assessment/Plan: Necrotizing pancreatitis - agree with clears, would not advance beyond that until pain better. Labs are improving   LOS: 4 days    Ronnae Kaser E 04/11/2014

## 2014-04-12 LAB — BASIC METABOLIC PANEL
Anion gap: 16 — ABNORMAL HIGH (ref 5–15)
BUN: 7 mg/dL (ref 6–23)
CO2: 22 mEq/L (ref 19–32)
Calcium: 7.7 mg/dL — ABNORMAL LOW (ref 8.4–10.5)
Chloride: 107 mEq/L (ref 96–112)
Creatinine, Ser: 1.07 mg/dL (ref 0.50–1.35)
GFR calc Af Amer: 81 mL/min — ABNORMAL LOW (ref >90)
GFR calc non Af Amer: 70 mL/min — ABNORMAL LOW (ref >90)
Glucose, Bld: 135 mg/dL — ABNORMAL HIGH (ref 70–99)
Potassium: 3.5 mEq/L — ABNORMAL LOW (ref 3.7–5.3)
Sodium: 145 mEq/L (ref 137–147)

## 2014-04-12 MED ORDER — ONDANSETRON HCL 4 MG/2ML IJ SOLN
4.0000 mg | Freq: Four times a day (QID) | INTRAMUSCULAR | Status: DC | PRN
  Administered 2014-04-12: 4 mg via INTRAVENOUS
  Filled 2014-04-12: qty 2

## 2014-04-12 MED ORDER — POTASSIUM CHLORIDE CRYS ER 20 MEQ PO TBCR
40.0000 meq | EXTENDED_RELEASE_TABLET | Freq: Once | ORAL | Status: AC
  Administered 2014-04-12: 40 meq via ORAL
  Filled 2014-04-12: qty 2

## 2014-04-12 NOTE — Plan of Care (Signed)
Problem: Phase I Progression Outcomes Goal: OOB as tolerated unless otherwise ordered Outcome: Adequate for Discharge Ambulates in hall without difficulty.

## 2014-04-12 NOTE — Progress Notes (Signed)
[facility] Progress Note   Mason [name] GYK:[id number] DOB: 08-14-1945 DOA: 04/07/2014 PCP: London Pepper, MD  Brief narrative: Mason [name] is a 68 y.o. male status post laparoscopic cholecystectomy on 03/29/14 for gallstone pancreatitis. Treated with Primaxin in the hospital and discharged home on Augmentin. The patient returns with a complaint of fever for 48 hours and epigastric pain and is found to have necrosis of the pancreatic tail and related sepsis.   Subjective: Tolerating clears- his epigastric pain continues to improve.   Assessment/Plan: Principal Problem:   Sepsis due to pancreatic necrosis -Continue Zosyn, fever curve is improving - persistent leukocytosis due to acute inflammation  -- tolerating clears - adjusted IVF to prevent fluid overload -Per surgery team, is no urgent need for surgical intervention  Active Problems: Recent cholecystectomy -Ultrasound reveals no bile leak  Hypernatremia/ hypokalemia - change fluid to D51/2 NS with KCL with improvement in sodium - replace K today   Code Status: Full code Family Communication: daily communication with wife Disposition Plan: Home when stable DVT prophylaxis: Heparin  Consultants: surgery  Procedures: none  Antibiotics: Anti-infectives    Start     Dose/Rate Route Frequency Ordered Stop   04/08/14 0200  vancomycin (VANCOCIN) 1,250 mg in sodium chloride 0.9 % 250 mL IVPB  Status:  Discontinued     1,250 mg166.7 mL/hr over 90 Minutes Intravenous Every 12 hours 04/07/14 1413 04/08/14 1608   04/08/14 0200  piperacillin-tazobactam (ZOSYN) IVPB 3.375 g     3.375 g12.5 mL/hr over 240 Minutes Intravenous Every 8 hours 04/07/14 2153     04/07/14 1400  piperacillin-tazobactam (ZOSYN) IVPB 3.375 g     3.375 g100 mL/hr over 30 Minutes Intravenous  Once 04/07/14 1347 04/07/14 1737   04/07/14 1400  vancomycin (VANCOCIN) 2,000 mg in sodium chloride 0.9 % 500 mL IVPB     2,000 mg250 mL/hr over 120 Minutes  Intravenous  Once 04/07/14 1354 04/07/14 1914         Objective: Filed Weights   04/10/14 0559 04/11/14 0410 04/12/14 0530  Weight: 89.676 kg (197 lb 11.2 oz) 89.223 kg (196 lb 11.2 oz) 90.8 kg (200 lb 2.8 oz)    Intake/Output Summary (Last 24 hours) at 04/12/14 0938 Last data filed at 04/12/14 0536  Gross per 24 hour  Intake   1280 ml  Output    251 ml  Net   1029 ml     Vitals Filed Vitals:   04/11/14 2025 04/12/14 0213 04/12/14 0530 04/12/14 0838  BP: 113/67 117/66 129/75   Pulse: 77 81 77   Temp: 99.3 F (37.4 C) 98.6 F (37 C) 98.7 F (37.1 C)   TempSrc: Oral Oral Oral   Resp: 18 18 18    Height:      Weight:   90.8 kg (200 lb 2.8 oz)   SpO2: 97% 96% 98% 97%    Exam: General: AAO x 3, No acute respiratory distress Lungs: Clear to auscultation bilaterally without wheezes or crackles Cardiovascular: Regular rate and rhythm without murmur gallop or rub normal S1 and S2 Abdomen: mild epigastric tenderness, nondistended, soft, bowel sounds positive, no rebound, no ascites, no appreciable mass Extremities: No significant cyanosis, clubbing, or edema bilateral lower extremities  Data Reviewed: Basic Metabolic Panel:  Recent Labs Lab 04/07/14 1014 04/08/14 0454 04/11/14 0425 04/12/14 0424  NA 138 142 148* 145  K 3.5* 4.2 3.5* 3.5*  CL 100 105 110 107  CO2 23 24 20 22   GLUCOSE 115* 79 94 135*  BUN 10 10 11 7   CREATININE 1.08 1.17 1.00 1.07  CALCIUM 8.3* 7.9* 8.0* 7.7*   Liver Function Tests:  Recent Labs Lab 04/07/14 1014 04/08/14 0454 04/10/14 1037  AST 54* 33 24  ALT 65* 45 26  ALKPHOS 57 65 56  BILITOT 0.8 0.8 1.1  PROT 6.9 6.2 6.0  ALBUMIN 2.4* 2.2* 1.8*    Recent Labs Lab 04/07/14 1014 04/08/14 0454  LIPASE 60* 57   No results for input(s): AMMONIA in the last 168 hours. CBC:  Recent Labs Lab 04/07/14 1014 04/08/14 0454 04/09/14 0545 04/10/14 1037 04/11/14 0425  WBC 14.3* 13.6* 15.9* 16.8* 15.1*  NEUTROABS 12.6*  --   --    --   --   HGB 10.1* 9.1* 8.2* 8.4* 7.8*  HCT 30.2* 28.4* 25.1* 25.4* 24.4*  MCV 92.4 94.4 94.0 95.8 93.5  PLT 476* 489* 512* 545* 547*   Cardiac Enzymes:  Recent Labs Lab 04/07/14 1014  TROPONINI <0.30   BNP (last 3 results)  Recent Labs  07/10/13 0903  PROBNP 208.5*   CBG: No results for input(s): GLUCAP in the last 168 hours.  Recent Results (from the past 240 hour(s))  Culture, blood (routine x 2)     Status: None (Preliminary result)   Collection Time: 04/07/14  5:00 PM  Result Value Ref Range Status   Specimen Description BLOOD RIGHT HAND  Final   Special Requests BOTTLES DRAWN AEROBIC AND ANAEROBIC 5CC  Final   Culture  Setup Time   Final    04/08/2014 00:47 Performed at [facility]    Culture   Final           BLOOD CULTURE RECEIVED NO GROWTH TO DATE CULTURE WILL BE HELD FOR 5 DAYS BEFORE ISSUING A FINAL NEGATIVE REPORT Performed at [facility]    Report Status PENDING  Incomplete  Culture, blood (routine x 2)     Status: None (Preliminary result)   Collection Time: 04/07/14  5:00 PM  Result Value Ref Range Status   Specimen Description BLOOD LEFT ARM  Final   Special Requests BOTTLES DRAWN AEROBIC AND ANAEROBIC 5CC  Final   Culture  Setup Time   Final    04/08/2014 00:47 Performed at [facility]    Culture   Final           BLOOD CULTURE RECEIVED NO GROWTH TO DATE CULTURE WILL BE HELD FOR 5 DAYS BEFORE ISSUING A FINAL NEGATIVE REPORT Performed at [facility]    Report Status PENDING  Incomplete  Culture, Urine     Status: None   Collection Time: 04/09/14  2:37 AM  Result Value Ref Range Status   Specimen Description URINE, RANDOM  Final   Special Requests NONE  Final   Culture  Setup Time   Final    04/09/2014 12:40 Performed at [place] Performed at [facility]   Final   Culture NO GROWTH Performed at [facility]   Final   Report Status 04/10/2014  FINAL  Final     Studies:  Recent x-ray studies have been reviewed in detail by the Attending Physician  Scheduled Meds:  Scheduled Meds: . famotidine (PEPCID) IV  20 mg Intravenous Q12H  . fluticasone  1 puff Inhalation BID  . heparin  5,000 Units Subcutaneous 3 times per day  . pantoprazole  40 mg Oral Daily  . piperacillin-tazobactam (ZOSYN)  IV  3.375 g Intravenous Q8H  . potassium chloride  40 mEq Oral Once  . sodium chloride  3 mL Intravenous Q12H   Continuous Infusions: . dextrose 5 % and 0.45 % NaCl with KCl 20 mEq/L 100 mL/hr at 04/12/14 7014    Time spent on care of this patient: 35 min  [place], MD 04/12/2014, 9:38 AM  LOS: 5 days   [facility] Office  [phone] Pager - Text Page per [email or web address]  If 7PM-7AM, please contact night-coverage [email or web address]

## 2014-04-12 NOTE — Progress Notes (Signed)
  Subjective: Some nausea, burping; still with some epigastric pain; +BMs. No fever for 24hrs  Objective: Vital signs in last 24 hours: Temp:  [98.4 F (36.9 C)-99.3 F (37.4 C)] 98.7 F (37.1 C) (11/22 0530) Pulse Rate:  [77-102] 77 (11/22 0530) Resp:  [16-18] 18 (11/22 0530) BP: (113-133)/(66-80) 129/75 mmHg (11/22 0530) SpO2:  [96 %-98 %] 98 % (11/22 0530) Weight:  [200 lb 2.8 oz (90.8 kg)] 200 lb 2.8 oz (90.8 kg) (11/22 0530) Last BM Date: 04/11/14  Intake/Output from previous day: 11/21 0701 - 11/22 0700 In: 1280 [P.O.:1280] Out: 251 [Urine:250; Stool:1] Intake/Output this shift:    Alert, resting comfortably, nontoxic, not ill appearing Soft, bloated, some epigastric TTP - mild; no rt/guarding  Lab Results:   Recent Labs  04/10/14 1037 04/11/14 0425  WBC 16.8* 15.1*  HGB 8.4* 7.8*  HCT 25.4* 24.4*  PLT 545* 547*   BMET  Recent Labs  04/11/14 0425 04/12/14 0424  NA 148* 145  K 3.5* 3.5*  CL 110 107  CO2 20 22  GLUCOSE 94 135*  BUN 11 7  CREATININE 1.00 1.07  CALCIUM 8.0* 7.7*   PT/INR No results for input(s): LABPROT, INR in the last 72 hours. ABG No results for input(s): PHART, HCO3 in the last 72 hours.  Invalid input(s): PCO2, PO2  Studies/Results: No results found.  Anti-infectives: Anti-infectives    Start     Dose/Rate Route Frequency Ordered Stop   04/08/14 0200  vancomycin (VANCOCIN) 1,250 mg in sodium chloride 0.9 % 250 mL IVPB  Status:  Discontinued     1,250 mg166.7 mL/hr over 90 Minutes Intravenous Every 12 hours 04/07/14 1413 04/08/14 1608   04/08/14 0200  piperacillin-tazobactam (ZOSYN) IVPB 3.375 g     3.375 g12.5 mL/hr over 240 Minutes Intravenous Every 8 hours 04/07/14 2153     04/07/14 1400  piperacillin-tazobactam (ZOSYN) IVPB 3.375 g     3.375 g100 mL/hr over 30 Minutes Intravenous  Once 04/07/14 1347 04/07/14 1737   04/07/14 1400  vancomycin (VANCOCIN) 2,000 mg in sodium chloride 0.9 % 500 mL IVPB     2,000 mg250  mL/hr over 120 Minutes Intravenous  Once 04/07/14 1354 04/07/14 1914      Assessment/Plan: S/p Lap chole Sterile necrotizing pancreatitis of tail Acute pancreatitis  Although afebrile for 24hrs still with ongoing pain and nausea; therefore would NOT advance diet today If family has ongoing questions regarding pancreatitis mgmt, would recommend GI medicine consult since surgery has nothing else to offer this pt - no role for surgical intervention at this time point.   Mason [name]. Redmond Pulling, MD, FACS General, Bariatric, & Minimally Invasive Surgery [facility], [place]   LOS: 5 days    Gayland Curry 04/12/2014

## 2014-04-13 LAB — BASIC METABOLIC PANEL
Anion gap: 11 (ref 5–15)
BUN: 5 mg/dL — ABNORMAL LOW (ref 6–23)
CO2: 23 mEq/L (ref 19–32)
Calcium: 7.7 mg/dL — ABNORMAL LOW (ref 8.4–10.5)
Chloride: 106 mEq/L (ref 96–112)
Creatinine, Ser: 1.05 mg/dL (ref 0.50–1.35)
GFR calc Af Amer: 83 mL/min — ABNORMAL LOW (ref >90)
GFR calc non Af Amer: 71 mL/min — ABNORMAL LOW (ref >90)
Glucose, Bld: 123 mg/dL — ABNORMAL HIGH (ref 70–99)
Potassium: 3.4 mEq/L — ABNORMAL LOW (ref 3.7–5.3)
Sodium: 140 mEq/L (ref 137–147)

## 2014-04-13 LAB — MAGNESIUM: Magnesium: 1.8 mg/dL (ref 1.5–2.5)

## 2014-04-13 LAB — CBC
HCT: 22.6 % — ABNORMAL LOW (ref 39.0–52.0)
Hemoglobin: 7.3 g/dL — ABNORMAL LOW (ref 13.0–17.0)
MCH: 30.2 pg (ref 26.0–34.0)
MCHC: 32.3 g/dL (ref 30.0–36.0)
MCV: 93.4 fL (ref 78.0–100.0)
Platelets: 424 10*3/uL — ABNORMAL HIGH (ref 150–400)
RBC: 2.42 MIL/uL — ABNORMAL LOW (ref 4.22–5.81)
RDW: 14.7 % (ref 11.5–15.5)
WBC: 9.4 10*3/uL (ref 4.0–10.5)

## 2014-04-13 MED ORDER — POTASSIUM CHLORIDE CRYS ER 20 MEQ PO TBCR
40.0000 meq | EXTENDED_RELEASE_TABLET | ORAL | Status: AC
  Administered 2014-04-13 (×2): 40 meq via ORAL
  Filled 2014-04-13 (×2): qty 2

## 2014-04-13 MED ORDER — MORPHINE SULFATE 2 MG/ML IJ SOLN: 2.0000 mg | INTRAMUSCULAR | Status: DC | PRN

## 2014-04-13 MED ORDER — FAMOTIDINE 20 MG PO TABS
20.0000 mg | ORAL_TABLET | Freq: Two times a day (BID) | ORAL | Status: DC
  Administered 2014-04-13 – 2014-04-19 (×13): 20 mg via ORAL
  Filled 2014-04-13 (×14): qty 1

## 2014-04-13 NOTE — Progress Notes (Signed)
Patient ID: Mason [name], male   DOB: 1945/10/31, 68 y.o.   MRN: [id number]    Subjective: Pt feels well today.  Tolerating clear liquids.  He denies abdominal pain.  No nausea.  He does have some yellow discharge from his bottom when he wipes.  His stool is brown.  Objective: Vital signs in last 24 hours: Temp:  [97.9 F (36.6 C)-99.1 F (37.3 C)] 99.1 F (37.3 C) (11/23 1108) Pulse Rate:  [80-83] 81 (11/23 1108) Resp:  [17-18] 18 (11/23 1108) BP: (109-118)/(66-69) 109/66 mmHg (11/23 1108) SpO2:  [95 %-99 %] 95 % (11/23 1108) Weight:  [201 lb 11.5 oz (91.5 kg)] 201 lb 11.5 oz (91.5 kg) (11/23 0633) Last BM Date: 04/12/14  Intake/Output from previous day: 11/22 0701 - 11/23 0700 In: 5490.4 [P.O.:1410; I.V.:4080.4] Out: 800 [Urine:800] Intake/Output this shift: Total I/O In: 720 [P.O.:720] Out: 500 [Urine:500]  PE: Abd: soft, NT, ND, +BS  Lab Results:   Recent Labs  04/11/14 0425  WBC 15.1*  HGB 7.8*  HCT 24.4*  PLT 547*   BMET  Recent Labs  04/12/14 0424 04/13/14 0419  NA 145 140  K 3.5* 3.4*  CL 107 106  CO2 22 23  GLUCOSE 135* 123*  BUN 7 5*  CREATININE 1.07 1.05  CALCIUM 7.7* 7.7*   PT/INR No results for input(s): LABPROT, INR in the last 72 hours. CMP     Component Value Date/Time   NA 140 04/13/2014 0419   K 3.4* 04/13/2014 0419   CL 106 04/13/2014 0419   CO2 23 04/13/2014 0419   GLUCOSE 123* 04/13/2014 0419   BUN 5* 04/13/2014 0419   CREATININE 1.05 04/13/2014 0419   CALCIUM 7.7* 04/13/2014 0419   PROT 6.0 04/10/2014 1037   ALBUMIN 1.8* 04/10/2014 1037   AST 24 04/10/2014 1037   ALT 26 04/10/2014 1037   ALKPHOS 56 04/10/2014 1037   BILITOT 1.1 04/10/2014 1037   GFRNONAA 71* 04/13/2014 0419   GFRAA 83* 04/13/2014 0419   Lipase     Component Value Date/Time   LIPASE 57 04/08/2014 0454       Studies/Results: No results found.  Anti-infectives: Anti-infectives    Start     Dose/Rate Route Frequency Ordered Stop   04/08/14 0200   vancomycin (VANCOCIN) 1,250 mg in sodium chloride 0.9 % 250 mL IVPB  Status:  Discontinued     1,250 mg166.7 mL/hr over 90 Minutes Intravenous Every 12 hours 04/07/14 1413 04/08/14 1608   04/08/14 0200  piperacillin-tazobactam (ZOSYN) IVPB 3.375 g     3.375 g12.5 mL/hr over 240 Minutes Intravenous Every 8 hours 04/07/14 2153     04/07/14 1400  piperacillin-tazobactam (ZOSYN) IVPB 3.375 g     3.375 g100 mL/hr over 30 Minutes Intravenous  Once 04/07/14 1347 04/07/14 1737   04/07/14 1400  vancomycin (VANCOCIN) 2,000 mg in sodium chloride 0.9 % 500 mL IVPB     2,000 mg250 mL/hr over 120 Minutes Intravenous  Once 04/07/14 1354 04/07/14 1914       Assessment/Plan  1. Necrotizing pancreatitis secondary to gallstones 2. S/p lap chole 3. Fevers resolved  Plan: 1. RN approached me about the wife's concerns.  Apparently, she informed someone she didn't know he had necrotizing pancreatitis.  This has been thoroughly explained to her by myself, last week, my physicians, as well as the medical team.  I have also reviewed his most recent CT scan with her last week.  See my note from 04-09-14 to confirm this.  I have discussed with the wife the anticipated course of recovery multiple times and that I do not believe he will pass away from his current problem.  I did discuss the possibility with her (all of this last week) that he may have some chronic pancreatic changes and some intermittent pain.  We discussed the possibility that if he did not improve or could not tolerate oral feedings, that he may require nutrition through his IV or postpyloric TFs.  She expressed understanding of all of this at that time.  The wife is not present today upon my evaluation.  The patient had some good questions, which were all answered.  I also discussed with him his expected outcome.  He expressed understanding. 2. The patient is continuing to improve.  I think he could likely be advanced to full liquids today.  I will d/w Dr.  Rosendo Gros. 3. I suspect the yellow discharge he is having when he wipes may be greasy or fatty residue that he is not digesting well.  I am not suspicious of this currently, as the rest of his stool is normal and brown.  4. Consider GI consult for new opinion.  There are no further recommendations from a surgical standpoint.  The patient is stable and is improving.  No surgical indications  LOS: 6 days    Swanson Farnell E 04/13/2014, 11:41 AM Pager: [phone]

## 2014-04-13 NOTE — Care Management Note (Addendum)
    Page 1 of 2   04/19/2014     3:26:36 PM CARE MANAGEMENT NOTE 04/19/2014  Patient:  Mason [name],Mason [name]   Account Number:  [id number]  Date Initiated:  04/13/2014  Documentation initiated by:  HUTCHINSON,CRYSTAL  Subjective/Objective Assessment:   Sepsis     Action/Plan:   CM to follow for disposition needs   Anticipated DC Date:  04/18/2014   Anticipated DC Plan:  HOME W HOME HEALTH SERVICES      DC Planning Services  CM consult      PAC Choice  HOME HEALTH   Choice offered to / List presented to:  C-1 Patient        HH arranged  HH - 11 Patient Refused      HH agency  [facility]   Status of service:  Completed, signed off Medicare Important Message given?  YES (If response is "NO", the following Medicare IM given date fields will be blank) Date Medicare IM given:  04/13/2014 Medicare IM given by:  HUTCHINSON,CRYSTAL Date Additional Medicare IM given:  04/17/2014 Additional Medicare IM given by:  JULIE AMERSON  Discharge Disposition:  HOME/SELF CARE  Per UR Regulation:  Reviewed for med. necessity/level of care/duration of stay  If discussed at Long Length of Stay Meetings, dates discussed:   04/14/2014  04/16/2014    Comments:  04/19/14 13:00 CM met with pt and wife who now refuse any home health.  No other CM needs were communicated.  CM called [facility] to cancel HH arrangements. CM received callback as pt is HRI.  Pt and wife adamant in NOT utilizing services regardless of need.    , BSN, CM [phone].   04/17/14 Julie Amerson, RN, BSN [phone] Plan colonoscopy in am; dc home with HH when medically stable.  Please call [facility] when dc date is known.  phone [phone].  Crystal Hutchinson RN, BSN, MSHL, CCM  Nurse - Case Manager,  (Unit 3EC)  [phone]  04/15/2014 PT RECS:  None - remains independent and ambulating hallway Patient placed on HRI d/t bounceback readmission. Dispo Plan:  Home with HHS: RN  HRI ([facility] notified during LOS on  04/14/2014) Fax to [facility] pending d/c.     Crystal Hutchinson RN, BSN, MSHL, CCM  Nurse - Case Manager,  (Unit [facility]EC)  [phone]  04/14/2014 Patient placed on HRI d/t bounceback readmission. Dispo Plan:  Home with HHS: RN  HRI ([facility] notified during LOS on 04/14/2014)   

## 2014-04-13 NOTE — Progress Notes (Signed)
[facility] Progress Note   Mason [name] VVO:[id number] DOB: 1945/07/04 DOA: 04/07/2014 PCP: London Pepper, MD  Brief narrative: Mason [name] is a 68 y.o. male status post laparoscopic cholecystectomy on 03/29/14 for gallstone pancreatitis. Treated with Primaxin in the hospital and discharged home on Augmentin. The patient returns with a complaint of fever for 48 hours and epigastric pain and is found to have necrosis of the pancreatic tail and related sepsis.   Subjective: Continues to tolerate clears. No abdominal pain.   Assessment/Plan: Principal Problem:   Sepsis due to pancreatic necrosis -  Continue Zosyn, fever curve is improving - persistent leukocytosis due to acute inflammation  -- tolerating clears - adjusted IVF to prevent fluid overload - Per surgery team, is no urgent need for surgical intervention  Active Problems: Recent cholecystectomy -Ultrasound reveals no bile leak  Hypernatremia/ hypokalemia - change fluid to D51/2 NS with KCL with improvement in sodium - continue to replace K   Code Status: Full code Family Communication: daily communication with wife Disposition Plan: Home when stable DVT prophylaxis: Heparin  Consultants: surgery  Procedures: none  Antibiotics: Anti-infectives    Start     Dose/Rate Route Frequency Ordered Stop   04/08/14 0200  vancomycin (VANCOCIN) 1,250 mg in sodium chloride 0.9 % 250 mL IVPB  Status:  Discontinued     1,250 mg166.7 mL/hr over 90 Minutes Intravenous Every 12 hours 04/07/14 1413 04/08/14 1608   04/08/14 0200  piperacillin-tazobactam (ZOSYN) IVPB 3.375 g     3.375 g12.5 mL/hr over 240 Minutes Intravenous Every 8 hours 04/07/14 2153     04/07/14 1400  piperacillin-tazobactam (ZOSYN) IVPB 3.375 g     3.375 g100 mL/hr over 30 Minutes Intravenous  Once 04/07/14 1347 04/07/14 1737   04/07/14 1400  vancomycin (VANCOCIN) 2,000 mg in sodium chloride 0.9 % 500 mL IVPB     2,000 mg250 mL/hr over 120 Minutes  Intravenous  Once 04/07/14 1354 04/07/14 1914         Objective: Filed Weights   04/11/14 0410 04/12/14 0530 04/13/14 0633  Weight: 89.223 kg (196 lb 11.2 oz) 90.8 kg (200 lb 2.8 oz) 91.5 kg (201 lb 11.5 oz)    Intake/Output Summary (Last 24 hours) at 04/13/14 1042 Last data filed at 04/13/14 0944  Gross per 24 hour  Intake 6210.42 ml  Output   1300 ml  Net 4910.42 ml     Vitals Filed Vitals:   04/12/14 1452 04/12/14 2026 04/12/14 2038 04/13/14 0633  BP: 115/66  118/69 109/69  Pulse: 83  80 81  Temp: 99.1 F (37.3 C)  97.9 F (36.6 C) 98.8 F (37.1 C)  TempSrc: Oral  Oral Oral  Resp: 18  18 17   Height:      Weight:    91.5 kg (201 lb 11.5 oz)  SpO2: 99% 99% 99% 95%    Exam: General: AAO x 3, No acute respiratory distress Lungs: Clear to auscultation bilaterally without wheezes or crackles Cardiovascular: Regular rate and rhythm without murmur gallop or rub normal S1 and S2 Abdomen: mild epigastric tenderness, nondistended, soft, bowel sounds positive, no rebound, no ascites, no appreciable mass Extremities: No significant cyanosis, clubbing, or edema bilateral lower extremities  Data Reviewed: Basic Metabolic Panel:  Recent Labs Lab 04/07/14 1014 04/08/14 0454 04/11/14 0425 04/12/14 0424 04/13/14 0419  NA 138 142 148* 145 140  K 3.5* 4.2 3.5* 3.5* 3.4*  CL 100 105 110 107 106  CO2 23 24 20 22 23   GLUCOSE 115*  79 94 135* 123*  BUN 10 10 11 7  5*  CREATININE 1.08 1.17 1.00 1.07 1.05  CALCIUM 8.3* 7.9* 8.0* 7.7* 7.7*  MG  --   --   --   --  1.8   Liver Function Tests:  Recent Labs Lab 04/07/14 1014 04/08/14 0454 04/10/14 1037  AST 54* 33 24  ALT 65* 45 26  ALKPHOS 57 65 56  BILITOT 0.8 0.8 1.1  PROT 6.9 6.2 6.0  ALBUMIN 2.4* 2.2* 1.8*    Recent Labs Lab 04/07/14 1014 04/08/14 0454  LIPASE 60* 57   No results for input(s): AMMONIA in the last 168 hours. CBC:  Recent Labs Lab 04/07/14 1014 04/08/14 0454 04/09/14 0545 04/10/14 1037  04/11/14 0425  WBC 14.3* 13.6* 15.9* 16.8* 15.1*  NEUTROABS 12.6*  --   --   --   --   HGB 10.1* 9.1* 8.2* 8.4* 7.8*  HCT 30.2* 28.4* 25.1* 25.4* 24.4*  MCV 92.4 94.4 94.0 95.8 93.5  PLT 476* 489* 512* 545* 547*   Cardiac Enzymes:  Recent Labs Lab 04/07/14 1014  TROPONINI <0.30   BNP (last 3 results)  Recent Labs  07/10/13 0903  PROBNP 208.5*   CBG: No results for input(s): GLUCAP in the last 168 hours.  Recent Results (from the past 240 hour(s))  Culture, blood (routine x 2)     Status: None (Preliminary result)   Collection Time: 04/07/14  5:00 PM  Result Value Ref Range Status   Specimen Description BLOOD RIGHT HAND  Final   Special Requests BOTTLES DRAWN AEROBIC AND ANAEROBIC 5CC  Final   Culture  Setup Time   Final    04/08/2014 00:47 Performed at [facility]    Culture   Final           BLOOD CULTURE RECEIVED NO GROWTH TO DATE CULTURE WILL BE HELD FOR 5 DAYS BEFORE ISSUING A FINAL NEGATIVE REPORT Performed at [facility]    Report Status PENDING  Incomplete  Culture, blood (routine x 2)     Status: None (Preliminary result)   Collection Time: 04/07/14  5:00 PM  Result Value Ref Range Status   Specimen Description BLOOD LEFT ARM  Final   Special Requests BOTTLES DRAWN AEROBIC AND ANAEROBIC 5CC  Final   Culture  Setup Time   Final    04/08/2014 00:47 Performed at [facility]    Culture   Final           BLOOD CULTURE RECEIVED NO GROWTH TO DATE CULTURE WILL BE HELD FOR 5 DAYS BEFORE ISSUING A FINAL NEGATIVE REPORT Performed at [facility]    Report Status PENDING  Incomplete  Culture, Urine     Status: None   Collection Time: 04/09/14  2:37 AM  Result Value Ref Range Status   Specimen Description URINE, RANDOM  Final   Special Requests NONE  Final   Culture  Setup Time   Final    04/09/2014 12:40 Performed at [place] Performed at [facility]   Final   Culture NO  GROWTH Performed at [facility]   Final   Report Status 04/10/2014 FINAL  Final     Studies:  Recent x-ray studies have been reviewed in detail by the Attending Physician  Scheduled Meds:  Scheduled Meds: . famotidine  20 mg Oral Q12H  . fluticasone  1 puff Inhalation BID  . heparin  5,000  Units Subcutaneous 3 times per day  . pantoprazole  40 mg Oral Daily  . piperacillin-tazobactam (ZOSYN)  IV  3.375 g Intravenous Q8H  . potassium chloride  40 mEq Oral Q4H  . sodium chloride  3 mL Intravenous Q12H   Continuous Infusions: . dextrose 5 % and 0.45 % NaCl with KCl 20 mEq/L 1 mL (04/13/14 0930)    Time spent on care of this patient: 35 min  Jordan, MD 04/13/2014, 10:42 AM  LOS: 6 days   [facility] Office  [phone] Pager - Text Page per [email or web address]  If 7PM-7AM, please contact night-coverage [email or web address]

## 2014-04-13 NOTE — Progress Notes (Signed)
Patient complaining that his abdomen is feeling really bloating. His bowel sounds are hyperactive x 4 quadrants. Patient did not have pain this am, but he is not experiencing some discomfort and pain rated 7/10 on pain scale. Patient refused his lunch tray. md Rizwan paged.

## 2014-04-13 NOTE — Progress Notes (Signed)
Patient had a small stool with small amount of bright red blood.

## 2014-04-13 NOTE — Progress Notes (Addendum)
Called by RN who noted blood in patient's stool which is bright red. Per patient whom I have spoken with just now, he had a small amount bloody stool earlier today as well. No c/o abdominal pain. Never had issues with GI bleed in the past. Never has had a colonoscopy.  Will check CBC now- last CBC showed Hb of 7.8 dropping from 10 on admission but he is 10 L positive fluid balance and this drop may therefore, be partly dilutional. He is on Pepcid and Protonix already. Suspect this is a lower GI bleed- will stop s/c Heparin and place SCDs. Not on any other anticoagulation. Downgrade diet to clear liquids. Will consult GI. Spoke with Dr Henrene Pastor- they will see him in the AM.    Debbe Odea, MD

## 2014-04-13 NOTE — Progress Notes (Signed)
Now patient is also complaining of dribbling after urination and also said that he saw some blood in the stool this am. Stated that it was reported to aid.

## 2014-04-13 NOTE — Progress Notes (Signed)
ANTIBIOTIC CONSULT NOTE - FOLLOW UP  Pharmacy Consult for Zosyn Indication: necrotizing pancreatitis  No Known Allergies  Patient Measurements: Height: 6' (182.9 cm) Weight: 201 lb 11.5 oz (91.5 kg) IBW/kg (Calculated) : 77.6  Vital Signs: Temp: 99.1 F (37.3 C) (11/23 1108) Temp Source: Oral (11/23 1108) BP: 109/66 mmHg (11/23 1108) Pulse Rate: 81 (11/23 1108) Intake/Output from previous day: 11/22 0701 - 11/23 0700 In: 5490.4 [P.O.:1410; I.V.:4080.4] Out: 800 [Urine:800] Intake/Output from this shift: Total I/O In: 720 [P.O.:720] Out: 500 [Urine:500]  Labs:  Recent Labs  04/11/14 0425 04/12/14 0424 04/13/14 0419  WBC 15.1*  --   --   HGB 7.8*  --   --   PLT 547*  --   --   CREATININE 1.00 1.07 1.05   Estimated Creatinine Clearance: 74.9 mL/min (by C-G formula based on Cr of 1.05). No results for input(s): VANCOTROUGH, VANCOPEAK, VANCORANDOM, GENTTROUGH, GENTPEAK, GENTRANDOM, TOBRATROUGH, TOBRAPEAK, TOBRARND, AMIKACINPEAK, AMIKACINTROU, AMIKACIN in the last 72 hours.   Microbiology: Recent Results (from the past 720 hour(s))  Culture, blood (routine x 2)     Status: None   Collection Time: 03/27/14  9:10 AM  Result Value Ref Range Status   Specimen Description BLOOD RIGHT ANTECUBITAL  Final   Special Requests BOTTLES DRAWN AEROBIC AND ANAEROBIC 10CC  Final   Culture  Setup Time   Final    03/27/2014 15:41 Performed at [facility]    Culture   Final    NO GROWTH 5 DAYS Performed at [facility]    Report Status 04/02/2014 FINAL  Final  Culture, blood (routine x 2)     Status: None   Collection Time: 03/27/14  9:16 AM  Result Value Ref Range Status   Specimen Description BLOOD RIGHT HAND  Final   Special Requests BOTTLES DRAWN AEROBIC AND ANAEROBIC 10CC  Final   Culture  Setup Time   Final    03/27/2014 15:39 Performed at [facility]    Culture   Final    NO GROWTH 5 DAYS Performed at [facility]    Report Status  04/02/2014 FINAL  Final  Surgical pcr screen     Status: None   Collection Time: 03/29/14 10:51 AM  Result Value Ref Range Status   MRSA, PCR NEGATIVE NEGATIVE Final   Staphylococcus aureus NEGATIVE NEGATIVE Final    Comment:        The Xpert SA Assay (FDA approved for NASAL specimens in patients over 67 years of age), is one component of a comprehensive surveillance program.  Test performance has been validated by [employer] for patients greater than or equal to 11 year old. It is not intended to diagnose infection nor to guide or monitor treatment.   Clostridium Difficile by PCR     Status: None   Collection Time: 03/31/14  9:33 AM  Result Value Ref Range Status   C difficile by pcr NEGATIVE NEGATIVE Final  Culture, blood (routine x 2)     Status: None (Preliminary result)   Collection Time: 04/07/14  5:00 PM  Result Value Ref Range Status   Specimen Description BLOOD RIGHT HAND  Final   Special Requests BOTTLES DRAWN AEROBIC AND ANAEROBIC 5CC  Final   Culture  Setup Time   Final    04/08/2014 00:47 Performed at [facility]    Culture   Final           BLOOD CULTURE RECEIVED NO GROWTH TO DATE CULTURE  WILL BE HELD FOR 5 DAYS BEFORE ISSUING A FINAL NEGATIVE REPORT Performed at [facility]    Report Status PENDING  Incomplete  Culture, blood (routine x 2)     Status: None (Preliminary result)   Collection Time: 04/07/14  5:00 PM  Result Value Ref Range Status   Specimen Description BLOOD LEFT ARM  Final   Special Requests BOTTLES DRAWN AEROBIC AND ANAEROBIC 5CC  Final   Culture  Setup Time   Final    04/08/2014 00:47 Performed at [facility]    Culture   Final           BLOOD CULTURE RECEIVED NO GROWTH TO DATE CULTURE WILL BE HELD FOR 5 DAYS BEFORE ISSUING A FINAL NEGATIVE REPORT Performed at [facility]    Report Status PENDING  Incomplete  Culture, Urine     Status: None   Collection Time: 04/09/14  2:37 AM  Result Value Ref  Range Status   Specimen Description URINE, RANDOM  Final   Special Requests NONE  Final   Culture  Setup Time   Final    04/09/2014 12:40 Performed at [place] Performed at [facility]   Final   Culture NO GROWTH Performed at [facility]   Final   Report Status 04/10/2014 FINAL  Final    Anti-infectives    Start     Dose/Rate Route Frequency Ordered Stop   04/08/14 0200  vancomycin (VANCOCIN) 1,250 mg in sodium chloride 0.9 % 250 mL IVPB  Status:  Discontinued     1,250 mg166.7 mL/hr over 90 Minutes Intravenous Every 12 hours 04/07/14 1413 04/08/14 1608   04/08/14 0200  piperacillin-tazobactam (ZOSYN) IVPB 3.375 g     3.375 g12.5 mL/hr over 240 Minutes Intravenous Every 8 hours 04/07/14 2153     04/07/14 1400  piperacillin-tazobactam (ZOSYN) IVPB 3.375 g     3.375 g100 mL/hr over 30 Minutes Intravenous  Once 04/07/14 1347 04/07/14 1737   04/07/14 1400  vancomycin (VANCOCIN) 2,000 mg in sodium chloride 0.9 % 500 mL IVPB     2,000 mg250 mL/hr over 120 Minutes Intravenous  Once 04/07/14 1354 04/07/14 1914      Assessment: 68 y/o male on day 6 of Zosyn for necrotizing pancreatitis with no plan for surgical intervention at this point. He is afebrile, WBC were still elevated at 15.1 on 11/21, and renal function is stable. Cultures are negative thus far.  Primaxin 11/7>>11/10 Vanc 11/17>>11/18 Zosyn 11/17>>  11/17 Blood - NGTD 11/19 UCx: neg  Goal of Therapy:  Eradication of infection  Plan:  - Zosyn 3.375 g IV q8h to be infused over 4 hours - F/u clinical progress, LOT and culture data - Monitor renal function  [facility], Pharm.D., BCPS Clinical Pharmacist Pager: [phone] 04/13/2014 1:14 PM

## 2014-04-13 NOTE — Progress Notes (Signed)
UR completed [place] K. Keontae Levingston, RN, BSN, [place], CCM  04/13/2014 11:24 AM

## 2014-04-14 DIAGNOSIS — K922 Gastrointestinal hemorrhage, unspecified: Secondary | ICD-10-CM

## 2014-04-14 DIAGNOSIS — D649 Anemia, unspecified: Secondary | ICD-10-CM

## 2014-04-14 LAB — CBC
HCT: 24.4 % — ABNORMAL LOW (ref 39.0–52.0)
HCT: 26.6 % — ABNORMAL LOW (ref 39.0–52.0)
Hemoglobin: 7.8 g/dL — ABNORMAL LOW (ref 13.0–17.0)
Hemoglobin: 8.6 g/dL — ABNORMAL LOW (ref 13.0–17.0)
MCH: 30.6 pg (ref 26.0–34.0)
MCH: 29.9 pg (ref 26.0–34.0)
MCHC: 32 g/dL (ref 30.0–36.0)
MCHC: 32.3 g/dL (ref 30.0–36.0)
MCV: 95.7 fL (ref 78.0–100.0)
MCV: 92.4 fL (ref 78.0–100.0)
Platelets: 419 10*3/uL — ABNORMAL HIGH (ref 150–400)
Platelets: 426 10*3/uL — ABNORMAL HIGH (ref 150–400)
RBC: 2.55 MIL/uL — ABNORMAL LOW (ref 4.22–5.81)
RBC: 2.88 MIL/uL — ABNORMAL LOW (ref 4.22–5.81)
RDW: 14.7 % (ref 11.5–15.5)
RDW: 15.1 % (ref 11.5–15.5)
WBC: 9.2 10*3/uL (ref 4.0–10.5)
WBC: 9.6 10*3/uL (ref 4.0–10.5)

## 2014-04-14 LAB — CULTURE, BLOOD (ROUTINE X 2)
Culture: NO GROWTH
Culture: NO GROWTH

## 2014-04-14 LAB — BASIC METABOLIC PANEL
Anion gap: 11 (ref 5–15)
BUN: 5 mg/dL — ABNORMAL LOW (ref 6–23)
CO2: 24 mEq/L (ref 19–32)
Calcium: 8.1 mg/dL — ABNORMAL LOW (ref 8.4–10.5)
Chloride: 107 mEq/L (ref 96–112)
Creatinine, Ser: 1.12 mg/dL (ref 0.50–1.35)
GFR calc Af Amer: 77 mL/min — ABNORMAL LOW (ref >90)
GFR calc non Af Amer: 66 mL/min — ABNORMAL LOW (ref >90)
Glucose, Bld: 122 mg/dL — ABNORMAL HIGH (ref 70–99)
Potassium: 4.1 mEq/L (ref 3.7–5.3)
Sodium: 142 mEq/L (ref 137–147)

## 2014-04-14 LAB — PREPARE RBC (CROSSMATCH)

## 2014-04-14 LAB — ABO/RH: ABO/RH(D): O POS

## 2014-04-14 MED ORDER — BOOST / RESOURCE BREEZE PO LIQD
1.0000 | Freq: Two times a day (BID) | ORAL | Status: DC
  Administered 2014-04-14 – 2014-04-19 (×9): 1 via ORAL

## 2014-04-14 MED ORDER — SODIUM CHLORIDE 0.9 % IV SOLN
Freq: Once | INTRAVENOUS | Status: AC
  Administered 2014-04-14: 11:00:00 via INTRAVENOUS

## 2014-04-14 MED ORDER — PRO-STAT SUGAR FREE PO LIQD
30.0000 mL | Freq: Two times a day (BID) | ORAL | Status: AC
  Administered 2014-04-14 – 2014-04-17 (×6): 30 mL via ORAL
  Filled 2014-04-14 (×6): qty 30

## 2014-04-14 MED ORDER — PANCRELIPASE (LIP-PROT-AMYL) 36000-114000 UNITS PO CPEP
48000.0000 [IU] | ORAL_CAPSULE | Freq: Three times a day (TID) | ORAL | Status: DC
  Administered 2014-04-14 – 2014-04-19 (×15): 48000 [IU] via ORAL
  Filled 2014-04-14 (×17): qty 1

## 2014-04-14 MED ORDER — HYDROCORTISONE ACETATE 25 MG RE SUPP
25.0000 mg | Freq: Two times a day (BID) | RECTAL | Status: AC
  Administered 2014-04-14 – 2014-04-18 (×4): 25 mg via RECTAL
  Filled 2014-04-14 (×10): qty 1

## 2014-04-14 NOTE — Plan of Care (Signed)
Problem: Phase II Progression Outcomes Goal: Progress activity as tolerated unless otherwise ordered Outcome: Completed/Met Date Met:  04/14/14     

## 2014-04-14 NOTE — Progress Notes (Signed)
[facility] Progress Note     Subjective: Pt stable.  No significant pain, N/V.  Is tolerating clear liquids well.  Wife at bedside.  Has had 3 small bloody BM's where he noticed blood when he wipes.  Stools are light brown/orange in color.    Objective: Vital signs in last 24 hours: Temp:  [98 F (36.7 C)-99.1 F (37.3 C)] 98.7 F (37.1 C) (11/24 0553) Pulse Rate:  [78-98] 98 (11/24 0929) Resp:  [18-20] 18 (11/24 0553) BP: (109-122)/(63-73) 122/69 mmHg (11/24 0929) SpO2:  [95 %-98 %] 97 % (11/24 0929) Weight:  [201 lb 1 oz (91.2 kg)] 201 lb 1 oz (91.2 kg) (11/24 0553) Last BM Date: 04/14/14  Intake/Output from previous day: 11/23 0701 - 11/24 0700 In: 1810 [P.O.:1660; IV Piggyback:150] Out: 1425 [Urine:1425] Intake/Output this shift: Total I/O In: 360 [P.O.:360] Out: 400 [Urine:400]  PE: Gen:  Alert, NAD, pleasant Abd: Soft, ND, NT +BS, no HSM Rectum:  No external hemorrhoids or gross blood noted, DRE not performed   Lab Results:   Recent Labs  04/13/14 1822 04/14/14 0840  WBC 9.4 9.2  HGB 7.3* 7.8*  HCT 22.6* 24.4*  PLT 424* 419*   BMET  Recent Labs  04/13/14 0419 04/14/14 0445  NA 140 142  K 3.4* 4.1  CL 106 107  CO2 23 24  GLUCOSE 123* 122*  BUN 5* 5*  CREATININE 1.05 1.12  CALCIUM 7.7* 8.1*   PT/INR No results for input(s): LABPROT, INR in the last 72 hours. CMP     Component Value Date/Time   NA 142 04/14/2014 0445   K 4.1 04/14/2014 0445   CL 107 04/14/2014 0445   CO2 24 04/14/2014 0445   GLUCOSE 122* 04/14/2014 0445   BUN 5* 04/14/2014 0445   CREATININE 1.12 04/14/2014 0445   CALCIUM 8.1* 04/14/2014 0445   PROT 6.0 04/10/2014 1037   ALBUMIN 1.8* 04/10/2014 1037   AST 24 04/10/2014 1037   ALT 26 04/10/2014 1037   ALKPHOS 56 04/10/2014 1037   BILITOT 1.1 04/10/2014 1037   GFRNONAA 66* 04/14/2014 0445   GFRAA 77* 04/14/2014 0445   Lipase     Component Value Date/Time   LIPASE 57 04/08/2014 0454        Studies/Results: No results found.  Anti-infectives: Anti-infectives    Start     Dose/Rate Route Frequency Ordered Stop   04/08/14 0200  vancomycin (VANCOCIN) 1,250 mg in sodium chloride 0.9 % 250 mL IVPB  Status:  Discontinued     1,250 mg166.7 mL/hr over 90 Minutes Intravenous Every 12 hours 04/07/14 1413 04/08/14 1608   04/08/14 0200  piperacillin-tazobactam (ZOSYN) IVPB 3.375 g     3.375 g12.5 mL/hr over 240 Minutes Intravenous Every 8 hours 04/07/14 2153     04/07/14 1400  piperacillin-tazobactam (ZOSYN) IVPB 3.375 g     3.375 g100 mL/hr over 30 Minutes Intravenous  Once 04/07/14 1347 04/07/14 1737   04/07/14 1400  vancomycin (VANCOCIN) 2,000 mg in sodium chloride 0.9 % 500 mL IVPB     2,000 mg250 mL/hr over 120 Minutes Intravenous  Once 04/07/14 1354 04/07/14 1914       Assessment/Plan 1. Necrotizing pancreatitis secondary to gallstones 2. S/p lap chole - Dr. Ninfa Linden on 03/29/14 3. Fevers resolved 4. Rectal bleeding  Plan: 1.  Pain improved, no fevers, wbc normal.  No need for surgical intervention at this point.  Backed off to clears due to bloody stools. 2.  IVF, pain control, antiemetics,  3.  Hgb droped, but improved today, medicine suspects lower GI bleeding, dvt proph being held 4.  Medicine called GI for consult.  Never had a colonoscopy.  I do not see any external hemorrhoids. 5.  The wife thinks her husband is not going to make it, despite explaining multiple times that he is stable and he is recovering.    LOS: 7 days    Coralie Keens 04/14/2014, 10:17 AM Pager: [phone]

## 2014-04-14 NOTE — Consult Note (Signed)
[facility] Consultation Note  Referring Provider: Dr. Debbe Odea [facility]) Primary Care Physician:  London Pepper, MD  Reason for Consultation:  hematochezia  HPI: Mason [name] is a 68 y.o. male whom we've been asked to see for evaluation of hematochezia.  Patient has recent history of gallstone pancreatitis, has underwent laparoscopic cholecystectomy and was readmitted last week with worsening epigastric pain and fevers, with imaging done one week age worrisome for infected necrosis.  We were asked to see for hematochezia, which per patient started a few days ago, few total episodes, mostly seemingly low volume.  He has generalized abdominal discomfort, but no clear difference from his pancreatitis discomfort. No obvious recent change in his bowel habits.  Patient unclear about possible weight loss.  No prior colonoscopy.  No known family history of colon cancer.   Past Medical History  Diagnosis Date  . Pancreatitis, acute 07/08/2013  . Gout attack 1990  . Seasonal allergies   . Seasonal asthma     "related to allergies"  . GERD (gastroesophageal reflux disease)     Past Surgical History  Procedure Laterality Date  . Circumcision  ~ 1974  . Eye examination under anesthesia w/ retinal cryotherapy and retinal laser Right 2014    "for tear"  . Cholecystectomy N/A 03/29/2014    Procedure: LAPAROSCOPIC CHOLECYSTECTOMY WITH INTRAOPERATIVE CHOLANGIOGRAM;  Surgeon: Coralie Keens, MD;  Location: [facility];  Service: General;  Laterality: N/A;    Prior to Admission medications   Medication Sig Start Date End Date Taking? Authorizing Provider  albuterol (PROVENTIL HFA;VENTOLIN HFA) 108 (90 BASE) MCG/ACT inhaler Inhale 1-2 puffs into the lungs every 6 (six) hours as needed for wheezing or shortness of breath.   Yes Historical Provider, MD  fluticasone (FLOVENT HFA) 220 MCG/ACT inhaler Inhale 1 puff into the lungs 2 (two) times daily.   Yes Historical Provider, MD   HYDROcodone-acetaminophen (NORCO/VICODIN) 5-325 MG per tablet Take 1 tablet by mouth every 4 (four) hours as needed for moderate pain. 04/01/14  Yes Annita Brod, MD  omeprazole (PRILOSEC) 20 MG capsule Take 20 mg by mouth daily.   Yes Historical Provider, MD  ibuprofen (ADVIL,MOTRIN) 200 MG tablet Take 400 mg by mouth every 6 (six) hours as needed for moderate pain.    Historical Provider, MD    Current Facility-Administered Medications  Medication Dose Route Frequency Provider Last Rate Last Dose  . acetaminophen (TYLENOL) tablet 650 mg  650 mg Oral Q6H PRN Phillips Climes, MD   650 mg at 04/13/14 1346   Or  . acetaminophen (TYLENOL) suppository 650 mg  650 mg Rectal Q6H PRN Phillips Climes, MD      . alum & mag hydroxide-simeth (MAALOX/MYLANTA) 200-200-20 MG/5ML suspension 15 mL  15 mL Oral Q4H PRN Debbe Odea, MD   15 mL at 04/13/14 1346  . dextrose 5 % and 0.45 % NaCl with KCl 20 mEq/L infusion   Intravenous Continuous Debbe Odea, MD 100 mL/hr at 04/13/14 0930 1 mL at 04/13/14 0930  . famotidine (PEPCID) tablet 20 mg  20 mg Oral Q12H [facility] [place]   20 mg at 04/14/14 1110  . feeding supplement (RESOURCE BREEZE) (RESOURCE BREEZE) liquid 1 Container  1 Container Oral BID BM Reanne [place] Pink, RD      . fluticasone (FLOVENT HFA) 220 MCG/ACT inhaler 1 puff  1 puff Inhalation BID Phillips Climes, MD   1 puff at 04/14/14 0915  . heparin injection 5,000 Units  5,000 Units Subcutaneous 3 times per  day Phillips Climes, MD   5,000 Units at 04/13/14 1743  . morphine 2 MG/ML injection 2 mg  2 mg Intravenous Q4H PRN Debbe Odea, MD      . ondansetron (ZOFRAN) injection 4 mg  4 mg Intravenous Q6H PRN Debbe Odea, MD   4 mg at 04/12/14 0928  . pantoprazole (PROTONIX) EC tablet 40 mg  40 mg Oral Daily Phillips Climes, MD   40 mg at 04/14/14 1110  . piperacillin-tazobactam (ZOSYN) IVPB 3.375 g  3.375 g Intravenous Q8H Charmian Muff [place], RPH   3.375 g at 04/14/14 0059  .  sodium chloride 0.9 % injection 3 mL  3 mL Intravenous Q12H Phillips Climes, MD   3 mL at 04/13/14 1111    Allergies as of 04/07/2014  . (No Known Allergies)    History reviewed. No pertinent family history.  History   Social History  . Marital Status: Married    Spouse Name: N/A    Number of Children: N/A  . Years of Education: N/A   Occupational History  . Not on file.   Social History Main Topics  . Smoking status: Never Smoker   . Smokeless tobacco: Never Used  . Alcohol Use: Yes     Comment: 04/07/2014 "last alcohol was 06/2013"  . Drug Use: No  . Sexual Activity: Yes   Other Topics Concern  . Not on file   Social History Narrative    Review of Systems: ROS Dr. Waldron Labs 04/07/14 reviewed and I agree  Physical Exam: Vital signs in last 24 hours: Temp:  [98 F (36.7 C)-98.9 F (37.2 C)] 98.7 F (37.1 C) (11/24 1145) Pulse Rate:  [78-98] 78 (11/24 1145) Resp:  [18-20] 18 (11/24 1145) BP: (108-122)/(63-73) 117/68 mmHg (11/24 1145) SpO2:  [95 %-98 %] 97 % (11/24 0929) Weight:  [91.2 kg (201 lb 1 oz)] 91.2 kg (201 lb 1 oz) (11/24 0553) Last BM Date: 04/14/14 General:   Alert,  Well-developed, well-nourished, pleasant and cooperative in NAD Head:  Normocephalic and atraumatic. Eyes:  Sclera clear, no icterus.   Conjunctiva pink. Ears:  Normal auditory acuity. Nose:  No deformity, discharge,  or lesions. Mouth:  No deformity or lesions.  Oropharynx pink & moist. Neck:  Supple; no masses or thyromegaly. Lungs:  Clear throughout to auscultation.   No wheezes, crackles, or rhonchi. No acute distress. Heart:  Regular rate and rhythm; no murmurs, clicks, rubs,  or gallops. Abdomen:  Soft, non-distended, mild epigastric tenderness. No masses, hepatosplenomegaly or hernias noted. Hypoactive but present bowel sounds, without guarding, and without rebound.     Msk:  Symmetrical without gross deformities. Normal posture. Pulses:  Normal pulses noted. Extremities:   Without clubbing or edema. Neurologic:  Alert and  oriented x4;  Diffusely weak, otherwise grossly normal neurologically. Skin:  Intact without significant lesions or rashes. Cervical Nodes:  No significant cervical adenopathy. Psych:  Alert and cooperative. Normal mood and affect.   Lab Results:  Recent Labs  04/13/14 1822 04/14/14 0840  WBC 9.4 9.2  HGB 7.3* 7.8*  HCT 22.6* 24.4*  PLT 424* 419*   BMET  Recent Labs  04/12/14 0424 04/13/14 0419 04/14/14 0445  NA 145 140 142  K 3.5* 3.4* 4.1  CL 107 106 107  CO2 22 23 24   GLUCOSE 135* 123* 122*  BUN 7 5* 5*  CREATININE 1.07 1.05 1.12  CALCIUM 7.7* 7.7* 8.1*   LFT No results for input(s): PROT, ALBUMIN, AST, ALT, ALKPHOS, BILITOT, BILIDIR, IBILI in  the last 72 hours. PT/INR No results for input(s): LABPROT, INR in the last 72 hours.  Studies/Results: No results found.  Impression:  1.  Gallstone pancreatitis.  Complicated by necrosis (?infected). 2.  Hematochezia.  Small volume, recent onset, no prior colonoscopy. 3.  Anemia, likely multifactorial.  Plan:  1.  Supportive management of necrotizing pancreatitis, under guidance of [facility]. 2.  Trial of Anusol suppositories. 3.  Patient clearly needs a colonoscopy.  The main issue is timing.  Patient is hesitant to under bowel prep process, wondering (a) would he be able to tolerate the prep and (b) if drinking all the prep liquid might worsen is pancreatitis.   4.  I am mindful of these considerations above and agree, ideally, that we should try to opt for outpatient colonoscopy.  If his bleeding improves with supportive management, would try to opt for continued management of pancreatitis and pursuit of colonoscopy some time in the next month or two. On the other hand, if bleeding persists or worsens, might have to reconsider possibility of doing colonoscopy as inpatient. 5.   [facility] will follow; thank you for the consult.   LOS: 7 days   Katrenia Alkins M   04/14/2014, 1:06 PM

## 2014-04-14 NOTE — Progress Notes (Addendum)
[facility] Progress Note   Mason [name] ZOX:[id number] DOB: [birth year] DOA: 04/07/2014 PCP: London Pepper, MD  Brief narrative: Mason [name] is a 68 y.o. male status post laparoscopic cholecystectomy on 03/29/14 for gallstone pancreatitis. Treated with Primaxin in the hospital and discharged home on Augmentin. The patient returns with a complaint of fever for 48 hours and epigastric pain and is found to have necrosis of the pancreatic tail and related sepsis. He was clinically improving from this and was advanced to full liquids yesterday. However, he had a small bloody stool yesterday morning and a larger bloody stool in the afternoon. GI has been consult it and he is been downgraded back to clear liquids. We are transfusing 1 unit of blood today. He has not had any further bloody stools.   Subjective: Continues to tolerate clears. No abdominal pain but felt some mild discomfort after he drank milk (advanced to full liquids yesterday)  - he did not have anything else to drink and was downgraded back to clears due to noted GI bleed.  No further bloody stools.   Assessment/Plan: Principal Problem:   Sepsis due to pancreatic necrosis -  Continue Zosyn, fever curve is improving- transition to oral based upon surgery recommendations -  Leukocytosis now resolving. -- tolerating clears - adjusted IVF to prevent fluid overload - Per surgery team, is no urgent need for surgical intervention  Active Problems: GI bleed- anemia - began having BRBPR yesterday- 2 episodes- Hgb had been decreasing steadily but this was suspected to be dilutional as he is 10 L positive fluid balance due to aggressive hydration for pancreatic necrosis  - note that Hgb dropped from 7.8 to 7.3 but today is back at 7.8 - will transfuse 1 U PRBC to get Hgb above 8. -continue clears for now- GI to evaluate him today- he has never had a colonoscopy- has never been told he has hemorrhoids   Recent  cholecystectomy -Ultrasound reveals no bile leak  Hypernatremia/ hypokalemia - change fluid to D51/2 NS with KCL with improvement in sodium -  K+ replaced aggressively as well and now normal   Code Status: Full code Family Communication: daily communication with wife-  conversation with daughter in room today as well Disposition Plan: Home when stable DVT prophylaxis: Heparin  Consultants: surgery  Procedures: none  Antibiotics: Anti-infectives    Start     Dose/Rate Route Frequency Ordered Stop   04/08/14 0200  vancomycin (VANCOCIN) 1,250 mg in sodium chloride 0.9 % 250 mL IVPB  Status:  Discontinued     1,250 mg166.7 mL/hr over 90 Minutes Intravenous Every 12 hours 04/07/14 1413 04/08/14 1608   04/08/14 0200  piperacillin-tazobactam (ZOSYN) IVPB 3.375 g     3.375 g12.5 mL/hr over 240 Minutes Intravenous Every 8 hours 04/07/14 2153     04/07/14 1400  piperacillin-tazobactam (ZOSYN) IVPB 3.375 g     3.375 g100 mL/hr over 30 Minutes Intravenous  Once 04/07/14 1347 04/07/14 1737   04/07/14 1400  vancomycin (VANCOCIN) 2,000 mg in sodium chloride 0.9 % 500 mL IVPB     2,000 mg250 mL/hr over 120 Minutes Intravenous  Once 04/07/14 1354 04/07/14 1914         Objective: Filed Weights   04/12/14 0530 04/13/14 0633 04/14/14 0553  Weight: 90.8 kg (200 lb 2.8 oz) 91.5 kg (201 lb 11.5 oz) 91.2 kg (201 lb 1 oz)    Intake/Output Summary (Last 24 hours) at 04/14/14 1051 Last data filed at 04/14/14 0929  Gross per 24  hour  Intake   1450 ml  Output   1325 ml  Net    125 ml     Vitals Filed Vitals:   04/14/14 0922 04/14/14 0923 04/14/14 0924 04/14/14 0929  BP: 110/65 117/72 110/70 122/69  Pulse: 82 87 92 98  Temp:      TempSrc:      Resp:      Height:      Weight:      SpO2: 96% 97% 97% 97%    Exam: General: AAO x 3, No acute respiratory distress Lungs: Clear to auscultation bilaterally without wheezes or crackles Cardiovascular: Regular rate and rhythm without murmur  gallop or rub normal S1 and S2 Abdomen:  Mid abdomen and right and left quadrant slightly tender today,  Mildly distended- tympanic, soft, bowel sounds positive, no rebound, no ascites, no appreciable mass Extremities: No significant cyanosis, clubbing, or edema bilateral lower extremities  Data Reviewed: Basic Metabolic Panel:  Recent Labs Lab 04/08/14 0454 04/11/14 0425 04/12/14 0424 04/13/14 0419 04/14/14 0445  NA 142 148* 145 140 142  K 4.2 3.5* 3.5* 3.4* 4.1  CL 105 110 107 106 107  CO2 24 20 22 23 24   GLUCOSE 79 94 135* 123* 122*  BUN 10 11 7  5* 5*  CREATININE 1.17 1.00 1.07 1.05 1.12  CALCIUM 7.9* 8.0* 7.7* 7.7* 8.1*  MG  --   --   --  1.8  --    Liver Function Tests:  Recent Labs Lab 04/08/14 0454 04/10/14 1037  AST 33 24  ALT 45 26  ALKPHOS 65 56  BILITOT 0.8 1.1  PROT 6.2 6.0  ALBUMIN 2.2* 1.8*    Recent Labs Lab 04/08/14 0454  LIPASE 57   No results for input(s): AMMONIA in the last 168 hours. CBC:  Recent Labs Lab 04/09/14 0545 04/10/14 1037 04/11/14 0425 04/13/14 1822 04/14/14 0840  WBC 15.9* 16.8* 15.1* 9.4 9.2  HGB 8.2* 8.4* 7.8* 7.3* 7.8*  HCT 25.1* 25.4* 24.4* 22.6* 24.4*  MCV 94.0 95.8 93.5 93.4 95.7  PLT 512* 545* 547* 424* 419*   Cardiac Enzymes: No results for input(s): CKTOTAL, CKMB, CKMBINDEX, TROPONINI in the last 168 hours. BNP (last 3 results)  Recent Labs  07/10/13 0903  PROBNP 208.5*   CBG: No results for input(s): GLUCAP in the last 168 hours.  Recent Results (from the past 240 hour(s))  Culture, blood (routine x 2)     Status: None   Collection Time: 04/07/14  5:00 PM  Result Value Ref Range Status   Specimen Description BLOOD RIGHT HAND  Final   Special Requests BOTTLES DRAWN AEROBIC AND ANAEROBIC 5CC  Final   Culture  Setup Time   Final    04/08/2014 00:47 Performed at [place]   Final    NO GROWTH 5 DAYS Performed at [facility]    Report Status 04/14/2014 FINAL  Final   Culture, blood (routine x 2)     Status: None   Collection Time: 04/07/14  5:00 PM  Result Value Ref Range Status   Specimen Description BLOOD LEFT ARM  Final   Special Requests BOTTLES DRAWN AEROBIC AND ANAEROBIC 5CC  Final   Culture  Setup Time   Final    04/08/2014 00:47 Performed at [place]   Final    NO GROWTH 5 DAYS Performed at [facility]    Report Status 04/14/2014 FINAL  Final  Culture, Urine     Status: None   Collection Time: 04/09/14  2:37 AM  Result Value Ref Range Status   Specimen Description URINE, RANDOM  Final   Special Requests NONE  Final   Culture  Setup Time   Final    04/09/2014 12:40 Performed at [place] Performed at [facility]   Final   Culture NO GROWTH Performed at [facility]   Final   Report Status 04/10/2014 FINAL  Final     Studies:  Recent x-ray studies have been reviewed in detail by the Attending Physician  Scheduled Meds:  Scheduled Meds: . sodium chloride   Intravenous Once  . famotidine  20 mg Oral Q12H  . fluticasone  1 puff Inhalation BID  . heparin  5,000 Units Subcutaneous 3 times per day  . pantoprazole  40 mg Oral Daily  . piperacillin-tazobactam (ZOSYN)  IV  3.375 g Intravenous Q8H  . sodium chloride  3 mL Intravenous Q12H   Continuous Infusions: . dextrose 5 % and 0.45 % NaCl with KCl 20 mEq/L 1 mL (04/13/14 0930)    Time spent on care of this patient: 35 min  [place], MD 04/14/2014, 10:51 AM  LOS: 7 days   [facility] Office  [phone] Pager - Text Page per [email or web address]  If 7PM-7AM, please contact night-coverage [email or web address]

## 2014-04-14 NOTE — Progress Notes (Signed)
NUTRITION FOLLOW UP  DOCUMENTATION CODES Per approved criteria  -Non-severe (moderate) malnutrition in the context of acute illness or injury   Pt meets criteria for NON-SEVERE (MODERATE) MALNUTRITION in the context of ACUTE ILLNESS/INJURY as evidenced by 7% weight loss in less than 2 weeks, estimated energy intake <75% of estimated energy needs for >7 days, and mild muscle wasting.  Intervention:   Provide Resource Breeze BID until diet advanced, each supplement provides 250 kcal and 9 grams of protein Provide Pro-stat BID, each supplement provides 15 grams of protein and 100 kcal Diet advancement per MD RD to continue to monitor diet advancement and PO adeqaucy   Nutrition Dx:   Inadequate oral intake related to pain and nausea as evidenced by 7% weight loss in less than 2 weeks; ongoing  Goal:   Pt to meet >/= 90% of their estimated nutrition needs; unmet  Monitor:   Diet advancement, PO intake, weight trend, labs  Assessment:   68 y.o. male, was recently discharged from [facility] on 04/02/14 , status post gallstone pancreatitis, with laparoscopic cholecystectomy on 03/29/14. Patient has been complaining of fever over the last 48 hours, started to complain of epigastric abdominal pain, as well started to have belching, headache. In ED patient was found to be afebrile, but had leukocytosis of 14,000, with tachycardia, patient had CT abdomen and pelvis done which was significant for fluid collection at the gallbladder fossa and worsening acute pancreatitis with evidence of pancreatic necrosis in body and tail, as well study was suspicious for left lower lobe pneumonia.  11/17 NPO 11/21 Clear liquids 11/23 Full liquids, back to clear liquids 1800 hr Per PA note, "backed off to clear due to bloody stools"  Patient has been NPO or on a liquid diet for the past week. His weight has gone up 2 lbs since admission. +9.5 L balance since admission per chart. Patient resting  comfortably at time of visit with blood transfusion running. He reports feeling full/bloated after having clear liquids for lunch but, denies any nausea or pain. Per nursing notes, pt consumed 30-100% of meals while on clear liquids and 80% of one meal while on full liquid diet. He is agreeable to trying [employer] later today to provide protein and extra calories while on clear liquid diet.   Height: Ht Readings from Last 1 Encounters:  04/07/14 6' (1.829 m)    Weight Status:   Wt Readings from Last 1 Encounters:  04/14/14 201 lb 1 oz (91.2 kg)    Re-estimated needs:  Kcal: 2100-2300 Protein: 115-130 grams Fluid: 2.1-2.3 L/day  Skin: intact  Diet Order: Diet clear liquid   Intake/Output Summary (Last 24 hours) at 04/14/14 1404 Last data filed at 04/14/14 1355  Gross per 24 hour  Intake   1400 ml  Output   1901 ml  Net   -501 ml    Last BM: 11/24   Labs:   Recent Labs Lab 04/12/14 0424 04/13/14 0419 04/14/14 0445  NA 145 140 142  K 3.5* 3.4* 4.1  CL 107 106 107  CO2 22 23 24   BUN 7 5* 5*  CREATININE 1.07 1.05 1.12  CALCIUM 7.7* 7.7* 8.1*  MG  --  1.8  --   GLUCOSE 135* 123* 122*    CBG (last 3)  No results for input(s): GLUCAP in the last 72 hours.  Scheduled Meds: . famotidine  20 mg Oral Q12H  . feeding supplement (RESOURCE BREEZE)  1 Container Oral BID BM  .  fluticasone  1 puff Inhalation BID  . hydrocortisone  25 mg Rectal BID  . pantoprazole  40 mg Oral Daily  . piperacillin-tazobactam (ZOSYN)  IV  3.375 g Intravenous Q8H  . sodium chloride  3 mL Intravenous Q12H    Continuous Infusions: . dextrose 5 % and 0.45 % NaCl with KCl 20 mEq/L 1 mL (04/13/14 0930)    Pryor Ochoa RD, LDN Inpatient Clinical Dietitian Pager: [phone] After Hours Pager: [phone]

## 2014-04-14 NOTE — Progress Notes (Signed)
ANTIBIOTIC CONSULT NOTE - FOLLOW UP  Pharmacy Consult for Zosyn Indication: necrotizing pancreatitis  Assessment: 68 y/o male on day 7 of Zosyn for necrotizing pancreatitis with no plan for surgical intervention at this point. He is afebrile, WBC are down to normal, and renal function is stable. Cultures are negative thus far.  Primaxin 11/7>>11/10 Vanc 11/17>>11/18 Zosyn 11/17>>  11/17 Blood - NGTD 11/19 UCx: neg  Goal of Therapy:  Eradication of infection  Plan:  - Zosyn 3.375 g IV q8h to be infused over 4 hours - Pharmacy signing off - please re-consult if needed  [facility], [place].D., BCPS Clinical Pharmacist Pager: [phone] 04/14/2014 1:22 PM

## 2014-04-15 DIAGNOSIS — K625 Hemorrhage of anus and rectum: Secondary | ICD-10-CM

## 2014-04-15 LAB — TYPE AND SCREEN
ABO/RH(D): O POS
Antibody Screen: NEGATIVE
Unit division: 0

## 2014-04-15 LAB — CBC
HCT: 24.9 % — ABNORMAL LOW (ref 39.0–52.0)
Hemoglobin: 8 g/dL — ABNORMAL LOW (ref 13.0–17.0)
MCH: 29.9 pg (ref 26.0–34.0)
MCHC: 32.1 g/dL (ref 30.0–36.0)
MCV: 92.9 fL (ref 78.0–100.0)
Platelets: 385 10*3/uL (ref 150–400)
RBC: 2.68 MIL/uL — ABNORMAL LOW (ref 4.22–5.81)
RDW: 15.2 % (ref 11.5–15.5)
WBC: 8.4 10*3/uL (ref 4.0–10.5)

## 2014-04-15 LAB — BASIC METABOLIC PANEL
Anion gap: 8 (ref 5–15)
BUN: 4 mg/dL — ABNORMAL LOW (ref 6–23)
CO2: 26 mEq/L (ref 19–32)
Calcium: 8.2 mg/dL — ABNORMAL LOW (ref 8.4–10.5)
Chloride: 107 mEq/L (ref 96–112)
Creatinine, Ser: 1.14 mg/dL (ref 0.50–1.35)
GFR calc Af Amer: 75 mL/min — ABNORMAL LOW (ref >90)
GFR calc non Af Amer: 65 mL/min — ABNORMAL LOW (ref >90)
Glucose, Bld: 136 mg/dL — ABNORMAL HIGH (ref 70–99)
Potassium: 4.2 mEq/L (ref 3.7–5.3)
Sodium: 141 mEq/L (ref 137–147)

## 2014-04-15 MED ORDER — KCL IN DEXTROSE-NACL 20-5-0.45 MEQ/L-%-% IV SOLN
INTRAVENOUS | Status: DC
  Administered 2014-04-15 – 2014-04-17 (×4): via INTRAVENOUS
  Filled 2014-04-15 (×9): qty 1000

## 2014-04-15 NOTE — Progress Notes (Signed)
  Subjective: Pt doing well today.  Abd pain improved.. No further GIB   Objective: Vital signs in last 24 hours: Temp:  [98.6 F (37 C)-99.9 F (37.7 C)] 98.6 F (37 C) (11/25 0408) Pulse Rate:  [74-92] 74 (11/25 0408) Resp:  [18-20] 20 (11/25 0408) BP: (119-125)/(68-71) 125/71 mmHg (11/25 0408) SpO2:  [97 %-98 %] 98 % (11/25 0408) Weight:  [198 lb 13.7 oz (90.2 kg)] 198 lb 13.7 oz (90.2 kg) (11/25 0408) Last BM Date: 04/15/14  Intake/Output from previous day: 11/24 0701 - 11/25 0700 In: 2490 [P.O.:1440; I.V.:1000; IV Piggyback:50] Out: 9528 [Urine:1475; Stool:2] Intake/Output this shift: Total I/O In: 240 [P.O.:240] Out: 100 [Urine:100]  General appearance: alert and cooperative Resp: clear to auscultation bilaterally Cardio: regular rate and rhythm, S1, S2 normal, no murmur, click, rub or gallop GI: soft, min ttp in epigastrum  Lab Results:   Recent Labs  04/14/14 1840 04/15/14 0730  WBC 9.6 8.4  HGB 8.6* 8.0*  HCT 26.6* 24.9*  PLT 426* 385   BMET  Recent Labs  04/14/14 0445 04/15/14 0456  NA 142 141  K 4.1 4.2  CL 107 107  CO2 24 26  GLUCOSE 122* 136*  BUN 5* 4*  CREATININE 1.12 1.14  CALCIUM 8.1* 8.2*   Anti-infectives: Anti-infectives    Start     Dose/Rate Route Frequency Ordered Stop   04/08/14 0200  vancomycin (VANCOCIN) 1,250 mg in sodium chloride 0.9 % 250 mL IVPB  Status:  Discontinued     1,250 mg166.7 mL/hr over 90 Minutes Intravenous Every 12 hours 04/07/14 1413 04/08/14 1608   04/08/14 0200  piperacillin-tazobactam (ZOSYN) IVPB 3.375 g     3.375 g12.5 mL/hr over 240 Minutes Intravenous Every 8 hours 04/07/14 2153     04/07/14 1400  piperacillin-tazobactam (ZOSYN) IVPB 3.375 g     3.375 g100 mL/hr over 30 Minutes Intravenous  Once 04/07/14 1347 04/07/14 1737   04/07/14 1400  vancomycin (VANCOCIN) 2,000 mg in sodium chloride 0.9 % 500 mL IVPB     2,000 mg250 mL/hr over 120 Minutes Intravenous  Once 04/07/14 1354 04/07/14 1914       Assessment/Plan: Pancreatitis  I had a long discussion with the patient's wife.  We reviewed CT scan, both old and new, her questions were answered.  I assured her he is improving.  There is no need for further imaging since he is clinically improving. Con't Creon Mobilize Abx   LOS: 8 days    Rosario Jacks., [facility] 04/15/2014

## 2014-04-15 NOTE — Ethics Note (Signed)
Ethics Note:  On Monday 11/23 I was called by Mrs. Corado asking if she had the right to see her Husbands CT scans.  I inquired about the concern that was behind this question and she was initially reluctant to disclose too much expressing concern that it would be used against her.  I assured her that that would not happen and I would be glad to support her desire to understand and asked if her husband was able to make decisions and if so he would need to give consent. She indicated she was with him and he was concurring with her request but challenged me as to why he had to give consent since she paid the bills.   As she eventually disclosed more of her concern it appears that she was not clear about what she could expect to happen to her husband as a result of his diagnosis of Necrotizing pancreatitis and she didn't understand how this came to be.  She indicated that Saverio Danker, PA-C reviewed one CT scan with her, but she seemed to feel other information was not being disclosed. She appreciated the meeting with Claiborne Billings, but indicated she would like someone ( the physician ) to sit down with her and go over all test results. My best sense from what she said is that she thinks his pancreas is " dead." and that a person cannot live like this. She seemed to want to know if she needed to take him home if he is going to die. This seemed to be a primary driver of her concerns and need to see test results and understand.  I initially shared this concern with the Dept. Director of [facility]E and asked her to follow up. I received a second voice mail message from Mrs. Carbonell yesterday that she was still wanting a meeting. I checked with pts. Nurse and made sure she had the wife's phone number and indicated that she said she could come in with in 30 minutes to meet with a physician. I also spoke to the pt. And he indicated that he was in support of his wife's request and that he felt unclear about his prognosis.   I see  from progress notes that conversation has occurred with the patient, but not clear if a face to face has occurred with the wife.  Given her concern that all information is not being provided, and if she is continuing to express dissatisfaction, I would recommend a face to face meeting to review information and test results with patient and wife together as a way to avoid further suspicion about diagnosis and prognosis. The ethics consult service is willing to assist further if needed.    Jeanella Craze Ethics Consult Service [phone]

## 2014-04-15 NOTE — Progress Notes (Addendum)
[facility] Progress Note   Mason [name] KGU:[id number] DOB: [birth year] DOA: 04/07/2014 PCP: London Pepper, MD  Brief narrative: Mason [name] is a 68 y.o. male status post laparoscopic cholecystectomy on 03/29/14 for gallstone pancreatitis. Treated with Primaxin in the hospital and discharged home on Augmentin. The patient returns with a complaint of fever for 48 hours and epigastric pain and is found to have necrosis of the pancreatic tail and related sepsis. He was clinically improving from this and was advanced to full liquids yesterday. However, he had a small bloody stool yesterday morning and a larger bloody stool in the afternoon. GI has was consult dated and was downgraded back to clear liquids. He received 1 unit of blood on 11/25.  He has not had any further bloody stools.   Subjective: Continues to tolerate clears. Has not had any further bloody stools after the first 2 episodes. Agreeable to advance to full liquids today   Assessment/Plan: Principal Problem:   Sepsis due to pancreatic necrosis -  Continue Zosyn, fever curve is improving- transition to oral antibiotics based upon surgery recommendations -  Leukocytosis has resolved -- tolerating clears- advanced to full liquids  - adjusted IVF to prevent fluid overload- will need to continue to cut back as oral intake improves - Per surgery team, is no urgent need for surgical intervention- they have added Creon to his medication regimen  Active Problems: GI bleed- anemia -  2 episodes of BRBPR on 11/23 -  Hgb had been decreasing steadily since admission but this was suspected to be dilutional as he had a positive fluid balance of 10L due to aggressive hydration for pancreatic necrosis  - Transfused 1 U PRBC on 11/24-hemoglobin 8.0 today-continue to follow- no signs of ongoing bleeding -GI has evaluated the patient and they do not plan to perform a colonoscopy in-house unless bleeding does not resolve - Recommend he  follow-up with GI for colonoscopy as outpatient in the next few weeks   Recent cholecystectomy -Ultrasound reveals no bile leak  Hypernatremia/ hypokalemia - changed fluid to D51/2 NS with KCL with improvement in sodium -  K+ replaced aggressively as well and now normal  Thrombocytosis -Most likely due to acute inflammation/necrosis -Resolved   Code Status: Full code Family Communication: daily communication with wife-  conversation with daughter in room on 11/24 Disposition Plan: Home when able to tolerate solid food DVT prophylaxis: Heparin  Consultants: surgery  Procedures: none  Antibiotics: Anti-infectives    Start     Dose/Rate Route Frequency Ordered Stop   04/08/14 0200  vancomycin (VANCOCIN) 1,250 mg in sodium chloride 0.9 % 250 mL IVPB  Status:  Discontinued     1,250 mg166.7 mL/hr over 90 Minutes Intravenous Every 12 hours 04/07/14 1413 04/08/14 1608   04/08/14 0200  piperacillin-tazobactam (ZOSYN) IVPB 3.375 g     3.375 g12.5 mL/hr over 240 Minutes Intravenous Every 8 hours 04/07/14 2153     04/07/14 1400  piperacillin-tazobactam (ZOSYN) IVPB 3.375 g     3.375 g100 mL/hr over 30 Minutes Intravenous  Once 04/07/14 1347 04/07/14 1737   04/07/14 1400  vancomycin (VANCOCIN) 2,000 mg in sodium chloride 0.9 % 500 mL IVPB     2,000 mg250 mL/hr over 120 Minutes Intravenous  Once 04/07/14 1354 04/07/14 1914         Objective: Filed Weights   04/13/14 0633 04/14/14 0553 04/15/14 0408  Weight: 91.5 kg (201 lb 11.5 oz) 91.2 kg (201 lb 1 oz) 90.2 kg (198 lb 13.7 oz)  Intake/Output Summary (Last 24 hours) at 04/15/14 1111 Last data filed at 04/15/14 0900  Gross per 24 hour  Intake   2370 ml  Output   1177 ml  Net   1193 ml     Vitals Filed Vitals:   04/14/14 1413 04/14/14 2009 04/14/14 2013 04/15/14 0408  BP: 119/68 121/71  125/71  Pulse: 92 83  74  Temp: 99.9 F (37.7 C) 99.6 F (37.6 C)  98.6 F (37 C)  TempSrc: Oral Oral  Oral  Resp: 18 18  20    Height:      Weight:    90.2 kg (198 lb 13.7 oz)  SpO2:  97% 98% 98%    Exam: General: AAO x 3, No acute respiratory distress Lungs: Clear to auscultation bilaterally without wheezes or crackles Cardiovascular: Regular rate and rhythm without murmur gallop or rub normal S1 and S2 Abdomen:  Nontender and nondistended today, soft, bowel sounds positive, no rebound, no ascites, no appreciable mass Extremities: No significant cyanosis, clubbing, or edema bilateral lower extremities  Data Reviewed: Basic Metabolic Panel:  Recent Labs Lab 04/11/14 0425 04/12/14 0424 04/13/14 0419 04/14/14 0445 04/15/14 0456  NA 148* 145 140 142 141  K 3.5* 3.5* 3.4* 4.1 4.2  CL 110 107 106 107 107  CO2 20 22 23 24 26   GLUCOSE 94 135* 123* 122* 136*  BUN 11 7 5* 5* 4*  CREATININE 1.00 1.07 1.05 1.12 1.14  CALCIUM 8.0* 7.7* 7.7* 8.1* 8.2*  MG  --   --  1.8  --   --    Liver Function Tests:  Recent Labs Lab 04/10/14 1037  AST 24  ALT 26  ALKPHOS 56  BILITOT 1.1  PROT 6.0  ALBUMIN 1.8*   No results for input(s): LIPASE, AMYLASE in the last 168 hours. No results for input(s): AMMONIA in the last 168 hours. CBC:  Recent Labs Lab 04/11/14 0425 04/13/14 1822 04/14/14 0840 04/14/14 1840 04/15/14 0730  WBC 15.1* 9.4 9.2 9.6 8.4  HGB 7.8* 7.3* 7.8* 8.6* 8.0*  HCT 24.4* 22.6* 24.4* 26.6* 24.9*  MCV 93.5 93.4 95.7 92.4 92.9  PLT 547* 424* 419* 426* 385   Cardiac Enzymes: No results for input(s): CKTOTAL, CKMB, CKMBINDEX, TROPONINI in the last 168 hours. BNP (last 3 results)  Recent Labs  07/10/13 0903  PROBNP 208.5*   CBG: No results for input(s): GLUCAP in the last 168 hours.  Recent Results (from the past 240 hour(s))  Culture, blood (routine x 2)     Status: None   Collection Time: 04/07/14  5:00 PM  Result Value Ref Range Status   Specimen Description BLOOD RIGHT HAND  Final   Special Requests BOTTLES DRAWN AEROBIC AND ANAEROBIC 5CC  Final   Culture  Setup Time    Final    04/08/2014 00:47 Performed at [place]   Final    NO GROWTH 5 DAYS Performed at [facility]    Report Status 04/14/2014 FINAL  Final  Culture, blood (routine x 2)     Status: None   Collection Time: 04/07/14  5:00 PM  Result Value Ref Range Status   Specimen Description BLOOD LEFT ARM  Final   Special Requests BOTTLES DRAWN AEROBIC AND ANAEROBIC 5CC  Final   Culture  Setup Time   Final    04/08/2014 00:47 Performed at [place]   Final    NO GROWTH 5 DAYS Performed at  [facility]    Report Status 04/14/2014 FINAL  Final  Culture, Urine     Status: None   Collection Time: 04/09/14  2:37 AM  Result Value Ref Range Status   Specimen Description URINE, RANDOM  Final   Special Requests NONE  Final   Culture  Setup Time   Final    04/09/2014 12:40 Performed at [place] Performed at [facility]   Final   Culture NO GROWTH Performed at [facility]   Final   Report Status 04/10/2014 FINAL  Final     Studies:  Recent x-ray studies have been reviewed in detail by the Attending Physician  Scheduled Meds:  Scheduled Meds: . famotidine  20 mg Oral Q12H  . feeding supplement (PRO-STAT SUGAR FREE 64)  30 mL Oral BID WC  . feeding supplement (RESOURCE BREEZE)  1 Container Oral BID BM  . fluticasone  1 puff Inhalation BID  . hydrocortisone  25 mg Rectal BID  . lipase/protease/amylase  48,000 Units Oral TID AC  . pantoprazole  40 mg Oral Daily  . piperacillin-tazobactam (ZOSYN)  IV  3.375 g Intravenous Q8H  . sodium chloride  3 mL Intravenous Q12H   Continuous Infusions: . dextrose 5 % and 0.45 % NaCl with KCl 20 mEq/L 100 mL/hr at 04/14/14 1421    Time spent on care of this patient: 35 min  [place], MD 04/15/2014, 11:11 AM  LOS: 8 days   [facility] Office  [phone] Pager - Text Page per [email or web address]  If 7PM-7AM, please  contact night-coverage [email or web address]

## 2014-04-15 NOTE — Progress Notes (Signed)
PT Cancellation Note  Patient Details Name: Obi Scrima MRN: [id number] DOB: 1945/12/27   Cancelled Treatment:    Reason Eval/Treat Not Completed: PT screened, no needs identified, will sign off; patient independent walking in hallway many times a day.  No current needs per PT and RN.   Magda Kiel 04/15/2014, 11:31 AM

## 2014-04-15 NOTE — Progress Notes (Signed)
Subjective: Tolerating current diet. No further bleeding. Minimal abdominal pain.  Objective: Vital signs in last 24 hours: Temp:  [98.6 F (37 C)-99.9 F (37.7 C)] 98.6 F (37 C) (11/25 0408) Pulse Rate:  [74-92] 74 (11/25 0408) Resp:  [18-20] 20 (11/25 0408) BP: (119-125)/(68-71) 125/71 mmHg (11/25 0408) SpO2:  [97 %-98 %] 98 % (11/25 0408) Weight:  [90.2 kg (198 lb 13.7 oz)] 90.2 kg (198 lb 13.7 oz) (11/25 0408) Weight change: -1 kg (-2 lb 3.3 oz) Last BM Date: 04/15/14  PE: GEN:  NAD ABD:  Soft, non-tender  Lab Results: CBC    Component Value Date/Time   WBC 8.4 04/15/2014 0730   RBC 2.68* 04/15/2014 0730   HGB 8.0* 04/15/2014 0730   HCT 24.9* 04/15/2014 0730   PLT 385 04/15/2014 0730   MCV 92.9 04/15/2014 0730   MCH 29.9 04/15/2014 0730   MCHC 32.1 04/15/2014 0730   RDW 15.2 04/15/2014 0730   LYMPHSABS 0.8 04/07/2014 1014   MONOABS 0.8 04/07/2014 1014   EOSABS 0.0 04/07/2014 1014   BASOSABS 0.0 04/07/2014 1014   CMP     Component Value Date/Time   NA 141 04/15/2014 0456   K 4.2 04/15/2014 0456   CL 107 04/15/2014 0456   CO2 26 04/15/2014 0456   GLUCOSE 136* 04/15/2014 0456   BUN 4* 04/15/2014 0456   CREATININE 1.14 04/15/2014 0456   CALCIUM 8.2* 04/15/2014 0456   PROT 6.0 04/10/2014 1037   ALBUMIN 1.8* 04/10/2014 1037   AST 24 04/10/2014 1037   ALT 26 04/10/2014 1037   ALKPHOS 56 04/10/2014 1037   BILITOT 1.1 04/10/2014 1037   GFRNONAA 65* 04/15/2014 0456   GFRAA 75* 04/15/2014 0456   Assessment:  1. Gallstone pancreatitis. Complicated by necrosis (?infected).  Improving. 2. Hematochezia. Small volume, recent onset, no prior colonoscopy.  No bleeding x 24 hours. 3. Anemia, likely multifactorial.  Stable.  Plan:  1.  Continue Anusol and supportive care. 2.  Advancing diet per [facility] recommendations. 3.  Will revisit Friday; if bleeding recurs, might have to consider inpatient colonoscopy; otherwise, will plan on outpatient colonoscopy in a  couple months.   Mason [name] 04/15/2014, 1:57 PM

## 2014-04-16 LAB — BASIC METABOLIC PANEL
Anion gap: 11 (ref 5–15)
BUN: 6 mg/dL (ref 6–23)
CO2: 25 mEq/L (ref 19–32)
Calcium: 8.9 mg/dL (ref 8.4–10.5)
Chloride: 105 mEq/L (ref 96–112)
Creatinine, Ser: 1.13 mg/dL (ref 0.50–1.35)
GFR calc Af Amer: 76 mL/min — ABNORMAL LOW (ref >90)
GFR calc non Af Amer: 65 mL/min — ABNORMAL LOW (ref >90)
Glucose, Bld: 120 mg/dL — ABNORMAL HIGH (ref 70–99)
Potassium: 4.2 mEq/L (ref 3.7–5.3)
Sodium: 141 mEq/L (ref 137–147)

## 2014-04-16 LAB — CBC
HCT: 26.8 % — ABNORMAL LOW (ref 39.0–52.0)
Hemoglobin: 8.7 g/dL — ABNORMAL LOW (ref 13.0–17.0)
MCH: 30.2 pg (ref 26.0–34.0)
MCHC: 32.5 g/dL (ref 30.0–36.0)
MCV: 93.1 fL (ref 78.0–100.0)
Platelets: 388 10*3/uL (ref 150–400)
RBC: 2.88 MIL/uL — ABNORMAL LOW (ref 4.22–5.81)
RDW: 14.8 % (ref 11.5–15.5)
WBC: 7.6 10*3/uL (ref 4.0–10.5)

## 2014-04-16 MED ORDER — CIPROFLOXACIN HCL 500 MG PO TABS
500.0000 mg | ORAL_TABLET | Freq: Two times a day (BID) | ORAL | Status: DC
  Administered 2014-04-16 – 2014-04-19 (×7): 500 mg via ORAL
  Filled 2014-04-16 (×9): qty 1

## 2014-04-16 MED ORDER — METRONIDAZOLE 500 MG PO TABS
500.0000 mg | ORAL_TABLET | Freq: Three times a day (TID) | ORAL | Status: DC
Start: 2014-04-16 — End: 2014-04-19
  Administered 2014-04-16 – 2014-04-19 (×10): 500 mg via ORAL
  Filled 2014-04-16 (×14): qty 1

## 2014-04-16 NOTE — Progress Notes (Signed)
[facility] Progress Note   Mason [name] [id number]:[id number] DOB: [birth year] DOA: 04/07/2014 PCP: London Pepper, MD  Brief narrative: Mason [name] is a 68 y.o. male status post laparoscopic cholecystectomy on 03/29/14 for gallstone pancreatitis. Treated with Primaxin in the hospital and discharged home on Augmentin. The patient returns with a complaint of fever for 48 hours and epigastric pain and is found to have necrosis of the pancreatic tail and related sepsis. He was clinically improving from this and was advanced to full liquids yesterday. However, he had a small bloody stool 11/23 morning and a larger bloody stool in the afternoon. GI has was consult dated and was downgraded back to clear liquids. He received 1 unit of blood on 11/25.  He has not had any further bloody stools.   Subjective: Continues to tolerate clears. Has not had any further bloody stools after the first 2 episodes. ABDOMINAL pain much improved ,dvance to full liquids today .  Assessment/Plan: Principal Problem:   Sepsis due to pancreatic necrosis -  Continue Zosyn, fever curve is improving-will  transition to oral antibiotics  -  Leukocytosis has resolved -- tolerating full liquid- advanced to soft diet  - adjusted IVF to prevent fluid overload- will need to continue to cut back as oral intake improves - Per surgery team, is no urgent need for surgical intervention- they have added Creon to his medication regimen  Active Problems: GI bleed- anemia -  2 episodes of BRBPR on 11/23 -  Hgb had been decreasing steadily since admission but this was suspected to be dilutional as he had a positive fluid balance of 10L due to aggressive hydration for pancreatic necrosis  - Transfused 1 U PRBC on 11/24-hemoglobin 8.0 11/25-continue to follow- no signs of ongoing bleeding -GI has evaluated the patient and they do not plan to perform a colonoscopy in-house unless bleeding does not resolve - Recommend he follow-up with GI for  colonoscopy as outpatient in the next few weeks   Recent cholecystectomy -Ultrasound reveals no bile leak  Hypernatremia/ hypokalemia - changed fluid to D51/2 NS with KCL with improvement in sodium -  K+ replaced aggressively as well and now normal  Thrombocytosis -Most likely due to acute inflammation/necrosis -Resolved   Code Status: Full code Family Communication: Patient is alert and oriented, no family at bedside. Disposition Plan: Home when able to tolerate solid food DVT prophylaxis: Heparin  Consultants: surgery Gastroenterology Procedures: none  Antibiotics: Anti-infectives    Start     Dose/Rate Route Frequency Ordered Stop   04/08/14 0200  vancomycin (VANCOCIN) 1,250 mg in sodium chloride 0.9 % 250 mL IVPB  Status:  Discontinued     1,250 mg166.7 mL/hr over 90 Minutes Intravenous Every 12 hours 04/07/14 1413 04/08/14 1608   04/08/14 0200  piperacillin-tazobactam (ZOSYN) IVPB 3.375 g     3.375 g12.5 mL/hr over 240 Minutes Intravenous Every 8 hours 04/07/14 2153     04/07/14 1400  piperacillin-tazobactam (ZOSYN) IVPB 3.375 g     3.375 g100 mL/hr over 30 Minutes Intravenous  Once 04/07/14 1347 04/07/14 1737   04/07/14 1400  vancomycin (VANCOCIN) 2,000 mg in sodium chloride 0.9 % 500 mL IVPB     2,000 mg250 mL/hr over 120 Minutes Intravenous  Once 04/07/14 1354 04/07/14 1914         Objective: Filed Weights   04/14/14 0553 04/15/14 0408 04/16/14 0542  Weight: 91.2 kg (201 lb 1 oz) 90.2 kg (198 lb 13.7 oz) 88.4 kg (194 lb 14.2 oz)  Intake/Output Summary (Last 24 hours) at 04/16/14 0903 Last data filed at 04/16/14 0737  Gross per 24 hour  Intake   2625 ml  Output   1850 ml  Net    775 ml     Vitals Filed Vitals:   04/15/14 1400 04/15/14 1932 04/15/14 2047 04/16/14 0542  BP: 138/75 121/73  126/68  Pulse: 86 85 81 88  Temp: 98.5 F (36.9 C) 99.2 F (37.3 C)  98.3 F (36.8 C)  TempSrc: Oral Oral  Oral  Resp: 20 20 18 20   Height:      Weight:     88.4 kg (194 lb 14.2 oz)  SpO2: 100% 97% 98% 98%    Exam: General: AAO x 3, No acute respiratory distress Lungs: Clear to auscultation bilaterally without wheezes or crackles Cardiovascular: Regular rate and rhythm without murmur gallop or rub normal S1 and S2 Abdomen:  Nontender and nondistended today, soft, bowel sounds positive, no rebound, no ascites, no appreciable mass Extremities: No significant cyanosis, clubbing, or edema bilateral lower extremities  Data Reviewed: Basic Metabolic Panel:  Recent Labs Lab 04/12/14 0424 04/13/14 0419 04/14/14 0445 04/15/14 0456 04/16/14 0402  NA 145 140 142 141 141  K 3.5* 3.4* 4.1 4.2 4.2  CL 107 106 107 107 105  CO2 22 23 24 26 25   GLUCOSE 135* 123* 122* 136* 120*  BUN 7 5* 5* 4* 6  CREATININE 1.07 1.05 1.12 1.14 1.13  CALCIUM 7.7* 7.7* 8.1* 8.2* 8.9  MG  --  1.8  --   --   --    Liver Function Tests:  Recent Labs Lab 04/10/14 1037  AST 24  ALT 26  ALKPHOS 56  BILITOT 1.1  PROT 6.0  ALBUMIN 1.8*   No results for input(s): LIPASE, AMYLASE in the last 168 hours. No results for input(s): AMMONIA in the last 168 hours. CBC:  Recent Labs Lab 04/11/14 0425 04/13/14 1822 04/14/14 0840 04/14/14 1840 04/15/14 0730  WBC 15.1* 9.4 9.2 9.6 8.4  HGB 7.8* 7.3* 7.8* 8.6* 8.0*  HCT 24.4* 22.6* 24.4* 26.6* 24.9*  MCV 93.5 93.4 95.7 92.4 92.9  PLT 547* 424* 419* 426* 385   Cardiac Enzymes: No results for input(s): CKTOTAL, CKMB, CKMBINDEX, TROPONINI in the last 168 hours. BNP (last 3 results)  Recent Labs  07/10/13 0903  PROBNP 208.5*   CBG: No results for input(s): GLUCAP in the last 168 hours.  Recent Results (from the past 240 hour(s))  Culture, blood (routine x 2)     Status: None   Collection Time: 04/07/14  5:00 PM  Result Value Ref Range Status   Specimen Description BLOOD RIGHT HAND  Final   Special Requests BOTTLES DRAWN AEROBIC AND ANAEROBIC 5CC  Final   Culture  Setup Time   Final    04/08/2014  00:47 Performed at [place]   Final    NO GROWTH 5 DAYS Performed at [facility]    Report Status 04/14/2014 FINAL  Final  Culture, blood (routine x 2)     Status: None   Collection Time: 04/07/14  5:00 PM  Result Value Ref Range Status   Specimen Description BLOOD LEFT ARM  Final   Special Requests BOTTLES DRAWN AEROBIC AND ANAEROBIC 5CC  Final   Culture  Setup Time   Final    04/08/2014 00:47 Performed at [place]   Final    NO GROWTH 5 DAYS Performed  at [facility]    Report Status 04/14/2014 FINAL  Final  Culture, Urine     Status: None   Collection Time: 04/09/14  2:37 AM  Result Value Ref Range Status   Specimen Description URINE, RANDOM  Final   Special Requests NONE  Final   Culture  Setup Time   Final    04/09/2014 12:40 Performed at [place] Performed at [facility]   Final   Culture NO GROWTH Performed at [facility]   Final   Report Status 04/10/2014 FINAL  Final     Studies:  Recent x-ray studies have been reviewed in detail by the Attending Physician  Scheduled Meds:  Scheduled Meds: . famotidine  20 mg Oral Q12H  . feeding supplement (PRO-STAT SUGAR FREE 64)  30 mL Oral BID WC  . feeding supplement (RESOURCE BREEZE)  1 Container Oral BID BM  . fluticasone  1 puff Inhalation BID  . hydrocortisone  25 mg Rectal BID  . lipase/protease/amylase  48,000 Units Oral TID AC  . pantoprazole  40 mg Oral Daily  . piperacillin-tazobactam (ZOSYN)  IV  3.375 g Intravenous Q8H  . sodium chloride  3 mL Intravenous Q12H   Continuous Infusions: . dextrose 5 % and 0.45 % NaCl with KCl 20 mEq/L 75 mL/hr at 04/15/14 1201    Time spent on care of this patient: 30 min  Mason Kohles, MD 04/16/2014, 9:03 AM  LOS: 9 days   [facility] Office  [phone] Pager - Text Page per [email or web address]  If 7PM-7AM, please contact  night-coverage [email or web address]

## 2014-04-16 NOTE — Progress Notes (Signed)
Pt had a trace amount of blood in stool.  Hbg 8.7, MD made aware.  Will continue to monitor.

## 2014-04-16 NOTE — Progress Notes (Signed)
[facility] Progress Note     Subjective: Pt doing well.  No abdominal pain, no N/V.  No more bloody stools.  Pt says stools are more normal and less fatty with addition of Creon.    Objective: Vital signs in last 24 hours: Temp:  [98.3 F (36.8 C)-99.2 F (37.3 C)] 98.3 F (36.8 C) (11/26 0542) Pulse Rate:  [81-88] 88 (11/26 0542) Resp:  [18-20] 20 (11/26 0542) BP: (121-138)/(68-75) 126/68 mmHg (11/26 0542) SpO2:  [97 %-100 %] 98 % (11/26 0542) Weight:  [194 lb 14.2 oz (88.4 kg)] 194 lb 14.2 oz (88.4 kg) (11/26 0542) Last BM Date: 04/15/14  Intake/Output from previous day: 11/25 0701 - 11/26 0700 In: 2865 [P.O.:1080; I.V.:1635; IV Piggyback:150] Out: 1450 [Urine:1200; Stool:250] Intake/Output this shift: Total I/O In: -  Out: 500 [Urine:500]  PE: Gen:  Alert, NAD, pleasant Abd: Soft, NT/ND, +BS, no HSM, abdominal scars well healed   Lab Results:   Recent Labs  04/14/14 1840 04/15/14 0730  WBC 9.6 8.4  HGB 8.6* 8.0*  HCT 26.6* 24.9*  PLT 426* 385   BMET  Recent Labs  04/15/14 0456 04/16/14 0402  NA 141 141  K 4.2 4.2  CL 107 105  CO2 26 25  GLUCOSE 136* 120*  BUN 4* 6  CREATININE 1.14 1.13  CALCIUM 8.2* 8.9   PT/INR No results for input(s): LABPROT, INR in the last 72 hours. CMP     Component Value Date/Time   NA 141 04/16/2014 0402   K 4.2 04/16/2014 0402   CL 105 04/16/2014 0402   CO2 25 04/16/2014 0402   GLUCOSE 120* 04/16/2014 0402   BUN 6 04/16/2014 0402   CREATININE 1.13 04/16/2014 0402   CALCIUM 8.9 04/16/2014 0402   PROT 6.0 04/10/2014 1037   ALBUMIN 1.8* 04/10/2014 1037   AST 24 04/10/2014 1037   ALT 26 04/10/2014 1037   ALKPHOS 56 04/10/2014 1037   BILITOT 1.1 04/10/2014 1037   GFRNONAA 65* 04/16/2014 0402   GFRAA 76* 04/16/2014 0402   Lipase     Component Value Date/Time   LIPASE 57 04/08/2014 0454       Studies/Results: No results found.  Anti-infectives: Anti-infectives    Start     Dose/Rate  Route Frequency Ordered Stop   04/08/14 0200  vancomycin (VANCOCIN) 1,250 mg in sodium chloride 0.9 % 250 mL IVPB  Status:  Discontinued     1,250 mg166.7 mL/hr over 90 Minutes Intravenous Every 12 hours 04/07/14 1413 04/08/14 1608   04/08/14 0200  piperacillin-tazobactam (ZOSYN) IVPB 3.375 g     3.375 g12.5 mL/hr over 240 Minutes Intravenous Every 8 hours 04/07/14 2153     04/07/14 1400  piperacillin-tazobactam (ZOSYN) IVPB 3.375 g     3.375 g100 mL/hr over 30 Minutes Intravenous  Once 04/07/14 1347 04/07/14 1737   04/07/14 1400  vancomycin (VANCOCIN) 2,000 mg in sodium chloride 0.9 % 500 mL IVPB     2,000 mg250 mL/hr over 120 Minutes Intravenous  Once 04/07/14 1354 04/07/14 1914       Assessment/Plan 1. Necrotizing pancreatitis secondary to gallstones 2. S/p lap chole - Dr. Ninfa Linden on 03/29/14 3. Fevers - resolved 4. Rectal bleeding - resolved  Plan: 1. Pain improved, no fevers, wbc normal. No need for surgical intervention. Tolerating fulls, advance as tolerated 2. Not needing pain meds or antiemetics 3. Hgb improved 4. GI recommends OP colonoscopy 5. Talked to the patient at bedside.   6.  Likely does not need  antibiotics at discharge (day #9) - afebrile and WBC normal 7.  Follow up with normal post-of visit which should already be scheduled 8.  Ready for discharge from out perspective if he tolerates solid food 9.  Continue Creon at discharge    LOS: 9 days    DORT, Khaliya Golinski 04/16/2014, 8:29 AM Pager: [phone]

## 2014-04-17 LAB — CBC
HCT: 27.8 % — ABNORMAL LOW (ref 39.0–52.0)
Hemoglobin: 8.7 g/dL — ABNORMAL LOW (ref 13.0–17.0)
MCH: 29.2 pg (ref 26.0–34.0)
MCHC: 31.3 g/dL (ref 30.0–36.0)
MCV: 93.3 fL (ref 78.0–100.0)
Platelets: 412 10*3/uL — ABNORMAL HIGH (ref 150–400)
RBC: 2.98 MIL/uL — ABNORMAL LOW (ref 4.22–5.81)
RDW: 14.6 % (ref 11.5–15.5)
WBC: 7.3 10*3/uL (ref 4.0–10.5)

## 2014-04-17 LAB — BASIC METABOLIC PANEL
Anion gap: 11 (ref 5–15)
BUN: 8 mg/dL (ref 6–23)
CO2: 24 mEq/L (ref 19–32)
Calcium: 8.8 mg/dL (ref 8.4–10.5)
Chloride: 103 mEq/L (ref 96–112)
Creatinine, Ser: 1.07 mg/dL (ref 0.50–1.35)
GFR calc Af Amer: 81 mL/min — ABNORMAL LOW (ref >90)
GFR calc non Af Amer: 70 mL/min — ABNORMAL LOW (ref >90)
Glucose, Bld: 116 mg/dL — ABNORMAL HIGH (ref 70–99)
Potassium: 4.2 mEq/L (ref 3.7–5.3)
Sodium: 138 mEq/L (ref 137–147)

## 2014-04-17 MED ORDER — PEG 3350-KCL-NA BICARB-NACL 420 G PO SOLR
4000.0000 mL | Freq: Once | ORAL | Status: DC
  Filled 2014-04-17: qty 4000

## 2014-04-17 MED ORDER — BISACODYL 5 MG PO TBEC
10.0000 mg | DELAYED_RELEASE_TABLET | Freq: Once | ORAL | Status: AC
  Administered 2014-04-17: 10 mg via ORAL
  Filled 2014-04-17: qty 2

## 2014-04-17 NOTE — Progress Notes (Signed)
I received a call from patient's nurse this afternoon, who tells me that Mr. Schnapp wants me to talk to his wife before considering having any procedures done.  I spoke with Hazel at length over the telephone.  I advised her that I am mindful of patient's recent pancreatitis, and it was indeed my hope to avoid colonoscopy while patient was hospitalized. However, I counseled her that her husband's hematochezia was persistent despite empiric trials with hemorrhoidal therapies, and that the only way to help get to the root cause of the hematochezia was to do a colonoscopy, especially since he's never had one before.  I advised her that I felt her husband would be able to tolerate the procedure.  Hazel, however, does not want her husband to undergo the colonoscopy, as she feels it would be putting him through too much intervention so soon after his pancreatitis.  She did relay that he has had some troubles with odynophagia and eructation and belching, ever since he ate a chicken and rice dish (before he was admitted for his pancreatitis), and she is concerned that he might have some esophageal irritation from that.  I told her that we could always add on an endoscopy to her husband's colonoscopy, to have both procedures done during the same anesthesia session; however, again, Onalee Hua is concerned that this would be too much too soon.  Onalee Hua is more desirous of patient having an endoscopy alone; I told her that this procedure would carry the lowest yield, as most people with significant heartburn symptoms have normal endoscopies, and that an endoscopy would be very unlikely to reveal source of patient's longstanding and hemodynamically stable bright red hematochezia.    If the main concern is to make sure there is no significant esophageal irritation, and if she is adamant that he not undergo a colonoscopy at this time, I have advised her husband have an upper GI series done.  However, should this be done, if may very  well delay our ability to perform a colonoscopy for a couple days, given the slow transit of barium through the GI tract in some patients.  In light of this information, and the fact that elective barium studies are not routinely performed on the weekend, I feel it is best to have a direct in-person discussion about these matters with patient, Onalee Hua and our [facility] GI hospital physician some time over the weekend.    I have informed patient's nurse that the colonoscopy prep and colonoscopy procedure, originally scheduled for tomorrow morning, is to be canceled.

## 2014-04-17 NOTE — Progress Notes (Signed)
Subjective: Reports continued bleeding.  Objective: Vital signs in last 24 hours: Temp:  [98.5 F (36.9 C)-98.7 F (37.1 C)] 98.5 F (36.9 C) (11/27 0510) Pulse Rate:  [80-85] 80 (11/27 0510) Resp:  [18] 18 (11/27 0510) BP: (119-123)/(66-75) 120/68 mmHg (11/27 0510) SpO2:  [98 %-100 %] 98 % (11/27 1112) Weight:  [87.961 kg (193 lb 14.7 oz)] 87.961 kg (193 lb 14.7 oz) (11/27 0510) Weight change: -0.439 kg (-15.5 oz) Last BM Date: 04/17/14  PE: GEN:  NAD ABD:  Soft, mild periumbilical tenderness, active bowel sounds  Lab Results: CBC    Component Value Date/Time   WBC 7.3 04/17/2014 0311   RBC 2.98* 04/17/2014 0311   HGB 8.7* 04/17/2014 0311   HCT 27.8* 04/17/2014 0311   PLT 412* 04/17/2014 0311   MCV 93.3 04/17/2014 0311   MCH 29.2 04/17/2014 0311   MCHC 31.3 04/17/2014 0311   RDW 14.6 04/17/2014 0311   LYMPHSABS 0.8 04/07/2014 1014   MONOABS 0.8 04/07/2014 1014   EOSABS 0.0 04/07/2014 1014   BASOSABS 0.0 04/07/2014 1014   CMP     Component Value Date/Time   NA 138 04/17/2014 0311   K 4.2 04/17/2014 0311   CL 103 04/17/2014 0311   CO2 24 04/17/2014 0311   GLUCOSE 116* 04/17/2014 0311   BUN 8 04/17/2014 0311   CREATININE 1.07 04/17/2014 0311   CALCIUM 8.8 04/17/2014 0311   PROT 6.0 04/10/2014 1037   ALBUMIN 1.8* 04/10/2014 1037   AST 24 04/10/2014 1037   ALT 26 04/10/2014 1037   ALKPHOS 56 04/10/2014 1037   BILITOT 1.1 04/10/2014 1037   GFRNONAA 70* 04/17/2014 0311   GFRAA 81* 04/17/2014 0311   Assessment:  1.  Gallstone pancreatitis complicated by necrosis, much improved.  Essentially clinically quiescent. 2.  Anemia, likely multifactorial. 3.  Hematochezia, hemodynamically stable, persistent despite trial of Anusol.  Plan:  1.  Clear liquids today. 2.  Colonoscopy tomorrow for further evaluation.  Landry Dyke 04/17/2014, 12:44 PM

## 2014-04-17 NOTE — Progress Notes (Signed)
[facility] Progress Note   Mason [name] QVZ:[id number] DOB: [birth year] DOA: 04/07/2014 PCP: London Pepper, MD  Brief narrative: Mason [name] is a 68 y.o. male status post laparoscopic cholecystectomy on 03/29/14 for gallstone pancreatitis. Treated with Primaxin in the hospital and discharged home on Augmentin. The patient returns with a complaint of fever for 48 hours and epigastric pain and is found to have necrosis of the pancreatic tail and related sepsis. He was clinically improving from this and was advanced to full liquids yesterday. However, he had a small bloody stool 11/23 morning and a larger bloody stool in the afternoon. GI has was consulted and was downgraded back to clear liquids. He received 1 unit of blood on 11/25. Patient diet was advanced to soft liquid which he tolerated very well, patient had recurrence of small amounts of lower GI bleed on 11/26, diet was downgraded again to clear liquid.   Subjective: Continues to tolerate clears. Had 2 episodes of small amount of lower GI bleed. ABDOMINAL pain much improved .  Assessment/Plan: Principal Problem:   Sepsis due to pancreatic necrosis -  Was on Zosyn (11/17-11/26), a febrile, no leukocytosis, oral Cipro and Flagyl 11/27 -  Leukocytosis has resolved -- tolerating  soft diet  - adjusted IVF to prevent fluid overload- will need to continue to cut back as oral intake improves - Per surgery team, is no urgent need for surgical intervention- they have added Creon to his medication regimen   GI bleed- anemia -  2 episodes of BRBPR on 11/23 and recurrent on 11/26 -  Hgb had been decreasing steadily since admission but this was suspected to be dilutional as he had a positive fluid balance of 10L due to aggressive hydration for pancreatic necrosis  - Transfused 1 U PRBC on 11/24-hemoglobin 8.0 11/25-continue to follow- no signs of ongoing bleeding -GI has evaluated the patient, initial plan was for outpatient colonoscopy,  but given the recurrent episodes of lower GI bleed, this is most likely will be done as an inpatient. -Hemoglobin is stable at 8.7  Recent cholecystectomy -HIDA scan reveals no bile leak  Hypernatremia/ hypokalemia - changed fluid to D51/2 NS with KCL with improvement in sodium -  K+ replaced aggressively as well and now normal  Thrombocytosis -Most likely due to acute inflammation/necrosis -Resolved   Code Status: Full code Family Communication: Patient is alert and oriented, no family at bedside. Disposition Plan: Home when able to tolerate solid food DVT prophylaxis: Heparin  Consultants: surgery Gastroenterology Procedures: none  Antibiotics: Anti-infectives    Start     Dose/Rate Route Frequency Ordered Stop   04/16/14 0915  ciprofloxacin (CIPRO) tablet 500 mg     500 mg Oral 2 times daily 04/16/14 0909     04/16/14 0915  metroNIDAZOLE (FLAGYL) tablet 500 mg     500 mg Oral 3 times per day 04/16/14 0909     04/08/14 0200  vancomycin (VANCOCIN) 1,250 mg in sodium chloride 0.9 % 250 mL IVPB  Status:  Discontinued     1,250 mg166.7 mL/hr over 90 Minutes Intravenous Every 12 hours 04/07/14 1413 04/08/14 1608   04/08/14 0200  piperacillin-tazobactam (ZOSYN) IVPB 3.375 g  Status:  Discontinued     3.375 g12.5 mL/hr over 240 Minutes Intravenous Every 8 hours 04/07/14 2153 04/16/14 0909   04/07/14 1400  piperacillin-tazobactam (ZOSYN) IVPB 3.375 g     3.375 g100 mL/hr over 30 Minutes Intravenous  Once 04/07/14 1347 04/07/14 1737   04/07/14 1400  vancomycin (VANCOCIN)  2,000 mg in sodium chloride 0.9 % 500 mL IVPB     2,000 mg250 mL/hr over 120 Minutes Intravenous  Once 04/07/14 1354 04/07/14 1914         Objective: Filed Weights   04/15/14 0408 04/16/14 0542 04/17/14 0510  Weight: 90.2 kg (198 lb 13.7 oz) 88.4 kg (194 lb 14.2 oz) 87.961 kg (193 lb 14.7 oz)    Intake/Output Summary (Last 24 hours) at 04/17/14 1128 Last data filed at 04/17/14 0946  Gross per 24 hour   Intake    798 ml  Output   2535 ml  Net  -1737 ml     Vitals Filed Vitals:   04/16/14 2023 04/16/14 2104 04/17/14 0510 04/17/14 1112  BP: 123/75  120/68   Pulse: 82  80   Temp: 98.6 F (37 C)  98.5 F (36.9 C)   TempSrc: Oral  Oral   Resp: 18  18   Height:      Weight:   87.961 kg (193 lb 14.7 oz)   SpO2: 99% 98% 100% 98%    Exam: General: AAO x 3, No acute respiratory distress Lungs: Clear to auscultation bilaterally without wheezes or crackles Cardiovascular: Regular rate and rhythm without murmur gallop or rub normal S1 and S2 Abdomen:  Nontender and nondistended today, soft, bowel sounds positive, no rebound, no ascites, no appreciable mass Extremities: No significant cyanosis, clubbing, or edema bilateral lower extremities  Data Reviewed: Basic Metabolic Panel:  Recent Labs Lab 04/13/14 0419 04/14/14 0445 04/15/14 0456 04/16/14 0402 04/17/14 0311  NA 140 142 141 141 138  K 3.4* 4.1 4.2 4.2 4.2  CL 106 107 107 105 103  CO2 23 24 26 25 24   GLUCOSE 123* 122* 136* 120* 116*  BUN 5* 5* 4* 6 8  CREATININE 1.05 1.12 1.14 1.13 1.07  CALCIUM 7.7* 8.1* 8.2* 8.9 8.8  MG 1.8  --   --   --   --    Liver Function Tests: No results for input(s): AST, ALT, ALKPHOS, BILITOT, PROT, ALBUMIN in the last 168 hours. No results for input(s): LIPASE, AMYLASE in the last 168 hours. No results for input(s): AMMONIA in the last 168 hours. CBC:  Recent Labs Lab 04/14/14 0840 04/14/14 1840 04/15/14 0730 04/16/14 1005 04/17/14 0311  WBC 9.2 9.6 8.4 7.6 7.3  HGB 7.8* 8.6* 8.0* 8.7* 8.7*  HCT 24.4* 26.6* 24.9* 26.8* 27.8*  MCV 95.7 92.4 92.9 93.1 93.3  PLT 419* 426* 385 388 412*   Cardiac Enzymes: No results for input(s): CKTOTAL, CKMB, CKMBINDEX, TROPONINI in the last 168 hours. BNP (last 3 results)  Recent Labs  07/10/13 0903  PROBNP 208.5*   CBG: No results for input(s): GLUCAP in the last 168 hours.  Recent Results (from the past 240 hour(s))  Culture, blood  (routine x 2)     Status: None   Collection Time: 04/07/14  5:00 PM  Result Value Ref Range Status   Specimen Description BLOOD RIGHT HAND  Final   Special Requests BOTTLES DRAWN AEROBIC AND ANAEROBIC 5CC  Final   Culture  Setup Time   Final    04/08/2014 00:47 Performed at [place]   Final    NO GROWTH 5 DAYS Performed at [facility]    Report Status 04/14/2014 FINAL  Final  Culture, blood (routine x 2)     Status: None   Collection Time: 04/07/14  5:00 PM  Result Value Ref Range Status  Specimen Description BLOOD LEFT ARM  Final   Special Requests BOTTLES DRAWN AEROBIC AND ANAEROBIC 5CC  Final   Culture  Setup Time   Final    04/08/2014 00:47 Performed at [facility]    Culture   Final    NO GROWTH 5 DAYS Performed at [facility]    Report Status 04/14/2014 FINAL  Final  Culture, Urine     Status: None   Collection Time: 04/09/14  2:37 AM  Result Value Ref Range Status   Specimen Description URINE, RANDOM  Final   Special Requests NONE  Final   Culture  Setup Time   Final    04/09/2014 12:40 Performed at [place] Performed at [facility]   Final   Culture NO GROWTH Performed at [facility]   Final   Report Status 04/10/2014 FINAL  Final     Studies:  Recent x-ray studies have been reviewed in detail by the Attending Physician  Scheduled Meds:  Scheduled Meds: . ciprofloxacin  500 mg Oral BID  . famotidine  20 mg Oral Q12H  . feeding supplement (RESOURCE BREEZE)  1 Container Oral BID BM  . fluticasone  1 puff Inhalation BID  . hydrocortisone  25 mg Rectal BID  . lipase/protease/amylase  48,000 Units Oral TID AC  . metroNIDAZOLE  500 mg Oral 3 times per day  . pantoprazole  40 mg Oral Daily  . sodium chloride  3 mL Intravenous Q12H   Continuous Infusions: . dextrose 5 % and 0.45 % NaCl with KCl 20 mEq/L 75 mL/hr at 04/17/14 0458    Time spent on  care of this patient: 30 min  Issacc Merlo, MD 04/17/2014, 11:28 AM  LOS: 10 days   [facility] Office  [phone] Pager - Text Page per [email or web address]  If 7PM-7AM, please contact night-coverage [email or web address]

## 2014-04-17 NOTE — Progress Notes (Signed)
Pt tolerating soft diet well.  Small amount of blood in stools yesterday, but Hgb stable.  No further recommendations.  Diet as tolerated.  D/c home when medically stable.  OP GI colonoscopy.  Pt stable from cholecystectomy perspective.  Normal post-op appointment.   Coralie Keens, [facility]

## 2014-04-17 NOTE — Plan of Care (Signed)
Problem: Phase II Progression Outcomes Goal: Vital signs remain stable Outcome: Progressing     

## 2014-04-18 ENCOUNTER — Encounter (HOSPITAL_COMMUNITY)
Admission: EM | Disposition: A | Discharge status: Home Health | Payer: Self-pay | Source: Home / Self Care | Attending: Internal Medicine

## 2014-04-18 LAB — BASIC METABOLIC PANEL
Anion gap: 11 (ref 5–15)
BUN: 8 mg/dL (ref 6–23)
CO2: 24 mEq/L (ref 19–32)
Calcium: 8.9 mg/dL (ref 8.4–10.5)
Chloride: 103 mEq/L (ref 96–112)
Creatinine, Ser: 1.08 mg/dL (ref 0.50–1.35)
GFR calc Af Amer: 80 mL/min — ABNORMAL LOW (ref >90)
GFR calc non Af Amer: 69 mL/min — ABNORMAL LOW (ref >90)
Glucose, Bld: 113 mg/dL — ABNORMAL HIGH (ref 70–99)
Potassium: 4.3 mEq/L (ref 3.7–5.3)
Sodium: 138 mEq/L (ref 137–147)

## 2014-04-18 LAB — CBC
HCT: 29 % — ABNORMAL LOW (ref 39.0–52.0)
Hemoglobin: 9.2 g/dL — ABNORMAL LOW (ref 13.0–17.0)
MCH: 29.6 pg (ref 26.0–34.0)
MCHC: 31.7 g/dL (ref 30.0–36.0)
MCV: 93.2 fL (ref 78.0–100.0)
Platelets: 409 10*3/uL — ABNORMAL HIGH (ref 150–400)
RBC: 3.11 MIL/uL — ABNORMAL LOW (ref 4.22–5.81)
RDW: 14.4 % (ref 11.5–15.5)
WBC: 6.6 10*3/uL (ref 4.0–10.5)

## 2014-04-18 SURGERY — COLONOSCOPY
Anesthesia: Moderate Sedation | Laterality: Left

## 2014-04-18 MED ORDER — MIDAZOLAM HCL 5 MG/ML IJ SOLN
INTRAMUSCULAR | Status: AC
  Filled 2014-04-18: qty 2

## 2014-04-18 MED ORDER — FENTANYL CITRATE 0.05 MG/ML IJ SOLN
INTRAMUSCULAR | Status: AC
  Filled 2014-04-18: qty 2

## 2014-04-18 NOTE — Progress Notes (Signed)
[facility] Progress Note   Mason [name] [id number]:[id number] DOB: [birth year] DOA: 04/07/2014 PCP: London Pepper, MD  Brief narrative: Mason [name] is a 68 y.o. male status post laparoscopic cholecystectomy on 03/29/14 for gallstone pancreatitis. Treated with Primaxin in the hospital and discharged home on Augmentin. The patient returns with a complaint of fever for 48 hours and epigastric pain and is found to have necrosis of the pancreatic tail and related sepsis. He was clinically improving from this and was advanced to full liquids yesterday. However, he had a small bloody stool 11/23 morning and a larger bloody stool in the afternoon. GI has was consulted and was downgraded back to clear liquids. He received 1 unit of blood on 11/25. Patient diet was advanced to soft liquid which he tolerated very well, patient had recurrence of small amounts of lower GI bleed on 11/26, diet was downgraded again to clear liquid. Patient was seen by Dr. Paulita Fujita with the plan for colonoscopy , but after further discussion with patient wife, she did not agree to colonoscopy, so procedure was canceled.  Discussed with wife on 04/18/14 regarding colonoscopy and again she declined for the procedure to be done, she wants to follow this as an outpatient when she feels he is more stable, scars with her and discharge planning is in progress.  Subjective: Continues to tolerate clears. No further lower GI bleed, no complaints of abdominal pain. .  Assessment/Plan: Principal Problem:   Sepsis due to pancreatic necrosis -  Was on Zosyn (11/17-11/26), a febrile, no leukocytosis, oral Cipro and Flagyl 11/27 -  Leukocytosis has resolved -- Advanced to  soft diet  - adjusted IVF to prevent fluid overload- will need to continue to cut back as oral intake improves - Per surgery team, is no urgent need for surgical intervention- they have added Creon to his medication regimen   GI bleed- anemia -  2 episodes of BRBPR on 11/23  and recurrent on 11/26 -  Hgb had been decreasing steadily since admission but this was suspected to be dilutional as he had a positive fluid balance of 10L due to aggressive hydration for pancreatic necrosis  - Transfused 1 U PRBC on 11/24-hemoglobin 8.0 11/25-continue to follow- no signs of ongoing bleeding -GI has evaluated the patient, initial plan was for outpatient colonoscopy, but given the recurrent episodes of lower GI bleed, GI planned for inpatient colonoscopy, but wife has declined. -Hemoglobin is stable at 9.2 .  Recent cholecystectomy -HIDA scan reveals no bile leak  Hypernatremia/ hypokalemia - Resolved -  K+ replaced aggressively as well and now normal  Thrombocytosis -Most likely due to acute inflammation/necrosis -Resolved   Code Status: Full code Family Communication: Patient is alert and oriented, no family at bedside. Disposition Plan: Home when able to tolerate solid food DVT prophylaxis: SCD  Consultants: surgery Gastroenterology Procedures: none  Antibiotics: Anti-infectives    Start     Dose/Rate Route Frequency Ordered Stop   04/16/14 0915  ciprofloxacin (CIPRO) tablet 500 mg     500 mg Oral 2 times daily 04/16/14 0909     04/16/14 0915  metroNIDAZOLE (FLAGYL) tablet 500 mg     500 mg Oral 3 times per day 04/16/14 0909     04/08/14 0200  vancomycin (VANCOCIN) 1,250 mg in sodium chloride 0.9 % 250 mL IVPB  Status:  Discontinued     1,250 mg166.7 mL/hr over 90 Minutes Intravenous Every 12 hours 04/07/14 1413 04/08/14 1608   04/08/14 0200  piperacillin-tazobactam (ZOSYN) IVPB 3.375  g  Status:  Discontinued     3.375 g12.5 mL/hr over 240 Minutes Intravenous Every 8 hours 04/07/14 2153 04/16/14 0909   04/07/14 1400  piperacillin-tazobactam (ZOSYN) IVPB 3.375 g     3.375 g100 mL/hr over 30 Minutes Intravenous  Once 04/07/14 1347 04/07/14 1737   04/07/14 1400  vancomycin (VANCOCIN) 2,000 mg in sodium chloride 0.9 % 500 mL IVPB     2,000 mg250 mL/hr over  120 Minutes Intravenous  Once 04/07/14 1354 04/07/14 1914         Objective: Filed Weights   04/16/14 0542 04/17/14 0510 04/18/14 0517  Weight: 88.4 kg (194 lb 14.2 oz) 87.961 kg (193 lb 14.7 oz) 85.367 kg (188 lb 3.2 oz)    Intake/Output Summary (Last 24 hours) at 04/18/14 1002 Last data filed at 04/18/14 0904  Gross per 24 hour  Intake 3531.25 ml  Output   2225 ml  Net 1306.25 ml     Vitals Filed Vitals:   04/17/14 1112 04/17/14 1459 04/17/14 2016 04/18/14 0517  BP:  131/72 138/68 107/77  Pulse:  75 78 85  Temp:  98.4 F (36.9 C) 98.6 F (37 C) 98.1 F (36.7 C)  TempSrc:  Oral Oral Oral  Resp:  20 20 18   Height:      Weight:    85.367 kg (188 lb 3.2 oz)  SpO2: 98% 99% 100% 98%    Exam: General: AAO x 3, No acute respiratory distress Lungs: Clear to auscultation bilaterally without wheezes or crackles Cardiovascular: Regular rate and rhythm without murmur gallop or rub normal S1 and S2 Abdomen:  Nontender and nondistended today, soft, bowel sounds positive, no rebound, no ascites, no appreciable mass Extremities: No significant cyanosis, clubbing, or edema bilateral lower extremities  Data Reviewed: Basic Metabolic Panel:  Recent Labs Lab 04/13/14 0419 04/14/14 0445 04/15/14 0456 04/16/14 0402 04/17/14 0311 04/18/14 0234  NA 140 142 141 141 138 138  K 3.4* 4.1 4.2 4.2 4.2 4.3  CL 106 107 107 105 103 103  CO2 23 24 26 25 24 24   GLUCOSE 123* 122* 136* 120* 116* 113*  BUN 5* 5* 4* 6 8 8   CREATININE 1.05 1.12 1.14 1.13 1.07 1.08  CALCIUM 7.7* 8.1* 8.2* 8.9 8.8 8.9  MG 1.8  --   --   --   --   --    Liver Function Tests: No results for input(s): AST, ALT, ALKPHOS, BILITOT, PROT, ALBUMIN in the last 168 hours. No results for input(s): LIPASE, AMYLASE in the last 168 hours. No results for input(s): AMMONIA in the last 168 hours. CBC:  Recent Labs Lab 04/14/14 1840 04/15/14 0730 04/16/14 1005 04/17/14 0311 04/18/14 0234  WBC 9.6 8.4 7.6 7.3 6.6   HGB 8.6* 8.0* 8.7* 8.7* 9.2*  HCT 26.6* 24.9* 26.8* 27.8* 29.0*  MCV 92.4 92.9 93.1 93.3 93.2  PLT 426* 385 388 412* 409*   Cardiac Enzymes: No results for input(s): CKTOTAL, CKMB, CKMBINDEX, TROPONINI in the last 168 hours. BNP (last 3 results)  Recent Labs  07/10/13 0903  PROBNP 208.5*   CBG: No results for input(s): GLUCAP in the last 168 hours.  Recent Results (from the past 240 hour(s))  Culture, Urine     Status: None   Collection Time: 04/09/14  2:37 AM  Result Value Ref Range Status   Specimen Description URINE, RANDOM  Final   Special Requests NONE  Final   Culture  Setup Time   Final    04/09/2014 12:40  Performed at [place] Performed at [facility]   Final   Culture NO GROWTH Performed at [facility]   Final   Report Status 04/10/2014 FINAL  Final     Studies:  Recent x-ray studies have been reviewed in detail by the Attending Physician  Scheduled Meds:  Scheduled Meds: . ciprofloxacin  500 mg Oral BID  . famotidine  20 mg Oral Q12H  . feeding supplement (RESOURCE BREEZE)  1 Container Oral BID BM  . fluticasone  1 puff Inhalation BID  . hydrocortisone  25 mg Rectal BID  . lipase/protease/amylase  48,000 Units Oral TID AC  . metroNIDAZOLE  500 mg Oral 3 times per day  . pantoprazole  40 mg Oral Daily  . polyethylene glycol-electrolytes  4,000 mL Oral Once  . sodium chloride  3 mL Intravenous Q12H   Continuous Infusions: . dextrose 5 % and 0.45 % NaCl with KCl 20 mEq/L 75 mL/hr at 04/17/14 1746    Time spent on care of this patient: 30 min  Nevan Creighton, MD 04/18/2014, 10:02 AM  LOS: 11 days   [facility] Office  [phone] Pager - Text Page per [email or web address]  If 7PM-7AM, please contact night-coverage [email or web address]

## 2014-04-18 NOTE — Progress Notes (Addendum)
[facility] Progress Note  Subjective: Denies abdominal pain today. States his last 2 bowel movements had no visible blood  Objective: Vital signs in last 24 hours: Temp:  [98.1 F (36.7 C)-98.6 F (37 C)] 98.1 F (36.7 C) (11/28 0517) Pulse Rate:  [75-85] 85 (11/28 0517) Resp:  [18-20] 18 (11/28 0517) BP: (107-138)/(68-77) 107/77 mmHg (11/28 0517) SpO2:  [96 %-100 %] 96 % (11/28 1010) Weight:  [85.367 kg (188 lb 3.2 oz)] 85.367 kg (188 lb 3.2 oz) (11/28 0517) Weight change: -2.594 kg (-5 lb 11.5 oz)   PE: Abdomen soft  Lab Results: Results for orders placed or performed during the hospital encounter of 04/07/14 (from the past 24 hour(s))  CBC     Status: Abnormal   Collection Time: 04/18/14  2:34 AM  Result Value Ref Range   WBC 6.6 4.0 - 10.5 K/uL   RBC 3.11 (L) 4.22 - 5.81 MIL/uL   Hemoglobin 9.2 (L) 13.0 - 17.0 g/dL   HCT 29.0 (L) 39.0 - 52.0 %   MCV 93.2 78.0 - 100.0 fL   MCH 29.6 26.0 - 34.0 pg   MCHC 31.7 30.0 - 36.0 g/dL   RDW 14.4 11.5 - 15.5 %   Platelets 409 (H) 150 - 400 K/uL  Basic metabolic panel     Status: Abnormal   Collection Time: 04/18/14  2:34 AM  Result Value Ref Range   Sodium 138 137 - 147 mEq/L   Potassium 4.3 3.7 - 5.3 mEq/L   Chloride 103 96 - 112 mEq/L   CO2 24 19 - 32 mEq/L   Glucose, Bld 113 (H) 70 - 99 mg/dL   BUN 8 6 - 23 mg/dL   Creatinine, Ser 1.08 0.50 - 1.35 mg/dL   Calcium 8.9 8.4 - 10.5 mg/dL   GFR calc non Af Amer 69 (L) >90 mL/min   GFR calc Af Amer 80 (L) >90 mL/min   Anion gap 11 5 - 15    Studies/Results: No results found.    Assessment: Necrotizing pancreatitis Hematochezia  Plan: Wife is still adamant about delaying colonoscopy until he is further improved from his pancreatitis. Hemoglobin currently stable. Can hopefully pursue toward the end of his hospitalization or patient's preference as outpatient. We'll continue to follow.    YJEHU,DJSH C 04/18/2014, 10:27 AM

## 2014-04-19 LAB — BASIC METABOLIC PANEL
Anion gap: 11 (ref 5–15)
BUN: 8 mg/dL (ref 6–23)
CO2: 25 mEq/L (ref 19–32)
Calcium: 8.7 mg/dL (ref 8.4–10.5)
Chloride: 105 mEq/L (ref 96–112)
Creatinine, Ser: 1.2 mg/dL (ref 0.50–1.35)
GFR calc Af Amer: 71 mL/min — ABNORMAL LOW (ref >90)
GFR calc non Af Amer: 61 mL/min — ABNORMAL LOW (ref >90)
Glucose, Bld: 117 mg/dL — ABNORMAL HIGH (ref 70–99)
Potassium: 4.4 mEq/L (ref 3.7–5.3)
Sodium: 141 mEq/L (ref 137–147)

## 2014-04-19 LAB — CBC
HCT: 28.7 % — ABNORMAL LOW (ref 39.0–52.0)
Hemoglobin: 8.9 g/dL — ABNORMAL LOW (ref 13.0–17.0)
MCH: 29 pg (ref 26.0–34.0)
MCHC: 31 g/dL (ref 30.0–36.0)
MCV: 93.5 fL (ref 78.0–100.0)
Platelets: 375 10*3/uL (ref 150–400)
RBC: 3.07 MIL/uL — ABNORMAL LOW (ref 4.22–5.81)
RDW: 14.4 % (ref 11.5–15.5)
WBC: 6.2 10*3/uL (ref 4.0–10.5)

## 2014-04-19 LAB — OCCULT BLOOD X 1 CARD TO LAB, STOOL: Fecal Occult Bld: NEGATIVE

## 2014-04-19 MED ORDER — PANCRELIPASE (LIP-PROT-AMYL) 24000-76000 UNITS PO CPEP: 48000.0000 [IU] | ORAL_CAPSULE | Freq: Three times a day (TID) | ORAL | Status: DC

## 2014-04-19 MED ORDER — BOOST / RESOURCE BREEZE PO LIQD: 1.0000 | Freq: Two times a day (BID) | ORAL | Status: DC

## 2014-04-19 MED ORDER — PANTOPRAZOLE SODIUM 40 MG PO TBEC: 40.0000 mg | DELAYED_RELEASE_TABLET | Freq: Every day | ORAL | Status: DC

## 2014-04-19 MED ORDER — HYDROCORTISONE ACETATE 25 MG RE SUPP: 25.0000 mg | Freq: Two times a day (BID) | RECTAL | Status: DC | PRN

## 2014-04-19 NOTE — Discharge Summary (Signed)
Mason [name], 68 y.o., DOB [birth year], MRN [id number]. Admission date: 04/07/2014 Discharge Date 04/19/2014 Primary MD London Pepper, MD Admitting Physician Phillips Climes, MD  Admission Diagnosis  Ileus [K56.7] Abdominal pain [R10.9] Chest pain [R07.9] Other acute pancreatitis [K85.8] Fever, unspecified fever cause [R50.9]  Discharge Diagnosis   Active Problems:   Necrotizing pancreatitis   Sepsis   Malnutrition of moderate degree   GI bleed   Anemia      Past Medical History  Diagnosis Date  . Pancreatitis, acute 07/08/2013  . Gout attack 1990  . Seasonal allergies   . Seasonal asthma     "related to allergies"  . GERD (gastroesophageal reflux disease)     Past Surgical History  Procedure Laterality Date  . Circumcision  ~ 1974  . Eye examination under anesthesia w/ retinal cryotherapy and retinal laser Right 2014    "for tear"  . Cholecystectomy N/A 03/29/2014    Procedure: LAPAROSCOPIC CHOLECYSTECTOMY WITH INTRAOPERATIVE CHOLANGIOGRAM;  Surgeon: Coralie Keens, MD;  Location: [facility];  Service: General;  Laterality: N/A;   Admission history of present illness/brief narrative: Mason [name] is a 68 y.o. male status post laparoscopic cholecystectomy on 03/29/14 for gallstone pancreatitis. Treated with Primaxin in the hospital and discharged home on Augmentin. The patient returns with a complaint of fever for 48 hours and epigastric pain and is found to have necrosis of the pancreatic tail and related sepsis. He was clinically improving from this and was advanced to full liquids yesterday. However, he had a small bloody stool 11/23 morning and a larger bloody stool in the afternoon. GI has was consulted and was downgraded back to clear liquids. He received 1 unit of blood on 11/25. Patient diet was advanced to soft liquid which he tolerated very well, patient had recurrence of small amounts of lower GI bleed on 11/26, diet was downgraded again to clear liquid. Patient was seen by  Dr. Paulita Fujita with the plan for colonoscopy , but after further discussion with patient wife, she did not agree to colonoscopy, so procedure was canceled. As well this discussion was carried again by Dr. Amedeo Plenty who recommended the patient to have the colonoscopy, but again patient and wife has refused.  Discussed with wife on 04/18/14 and on day of discharge 04/19/14 regarding colonoscopy and again she declined for the procedure to be done, she wants to follow this as an outpatient when she feels he is more stable. Patient bleeding has significantly improved, didn't have any episodes over the last 24 hours, hemoglobin has been stable over the last 4 days after transfusion, it was felt it was secondary to hemorrhoidal bleed, patient was Hemoccult-negative at day of discharge.  Hospital Course See H&P, Labs, Consult and Test reports for all details in brief, patient was admitted for   Active Problems:   Necrotizing pancreatitis   Sepsis   Malnutrition of moderate degree   GI bleed   Anemia  Sepsis due to pancreatic necrosis - Was on Zosyn (11/17-11/26), a febrile, no leukocytosis, oral Cipro and Flagyl 11/27-11/29 - Leukocytosis has resolved -- Advanced to soft diet, which she is tolerating very well. - Per surgery team,they have added Creon to his medication regimen, will need to follow-up with him as an outpatient on his normal postop appointment.   GI bleed- anemia - 2 episodes of BRBPR on 11/23 and recurrent on 11/26 - Hgb had been decreasing steadily since admission but this was suspected to be dilutional as he had a positive fluid balance of 10L  due to aggressive hydration for pancreatic necrosis  - Transfused 1 U PRBC on 11/24- -GI has evaluated the patient, initial plan was for outpatient colonoscopy, but given the recurrent episodes of lower GI bleed, GI planned for inpatient colonoscopy, but wife has declined, discussion was carried few times with the patient and his wife, and they  declined colonoscopy until the day of discharge, so patient is being discharged home, with recommendation to follow up with GI as an outpatient in 2 weeks , patient's hemoglobin has been stable over the last 4 days after blood transfusion, there is no episode of bleed over the last 24 hours, the patient Hemoccult is negative at day of discharge.   Recent cholecystectomy -HIDA scan reveals no bile leak  Hypernatremia/ hypokalemia - Resolved   Thrombocytosis -Most likely due to acute inflammation/necrosis -Resolved   Consults  Gastroenterology Surgery  Significant Tests:  See full reports for all details    Dg Chest 2 View  04/07/2014   CLINICAL DATA:  Short of breath with exertion  EXAM: CHEST  2 VIEW  COMPARISON:  Chest x-ray of 03/31/2014  FINDINGS: Streaky opacity remains primarily at the left lung base which may represent scarring, atelectasis, or possibly residual pneumonia. The right lung is clear. The heart is within normal limits in size. No mediastinal or hilar abnormality is seen. No bony abnormality is noted.  IMPRESSION: Persistent left basilar opacity may represent atelectasis, scarring, or possible residual pneumonia.   Electronically Signed   By: Ivar Drape M.D.   On: 04/07/2014 13:38   Dg Chest 2 View  03/31/2014   CLINICAL DATA:  Postop fever following cholecystectomy.  EXAM: CHEST  2 VIEW  COMPARISON:  07/16/2013  FINDINGS: Bibasilar atelectasis, similar to prior study. Suspect small effusions, also stable. Heart is upper limits normal in size. Mediastinal contours are within normal limits. No acute bony abnormality.  IMPRESSION: Stable bibasilar opacities, likely atelectasis with small effusions.   Electronically Signed   By: Rolm Baptise M.D.   On: 03/31/2014 09:57   Dg Cholangiogram Operative  03/29/2014   CLINICAL DATA:  Cholecystitis, laparoscopic cholecystectomy  EXAM: INTRAOPERATIVE CHOLANGIOGRAM  TECHNIQUE: Cholangiographic images from the C-arm fluoroscopic  device were submitted for interpretation post-operatively. Please see the procedural report for the amount of contrast and the fluoroscopy time utilized.  COMPARISON:  CT 03/28/2014  FINDINGS: Cholecystectomy clips are noted. Normal caliber common and intrahepatic ducts. No filling defect. Prompt excretion into the duodenum is noted.  IMPRESSION: Expected appearance of intraoperative cholangiogram.   Electronically Signed   By: Conchita Paris M.D.   On: 03/29/2014 12:44   Nm Hepatobiliary Liver Func  04/07/2014   CLINICAL DATA:  68 year old male status post cholecystectomy on 03/29/2014. Fluid collection within the gallbladder fossa concerning for possible biliary leak.  EXAM: NUCLEAR MEDICINE HEPATOBILIARY IMAGING  TECHNIQUE: Sequential images of the abdomen were obtained out to 60 minutes following intravenous administration of radiopharmaceutical.  RADIOPHARMACEUTICALS:  5 Millic[employer]ie [id number] Choletec  COMPARISON:  CT scan of the abdomen and pelvis 04/07/2014  FINDINGS: No evidence of abnormal collection of radiotracer in the gallbladder fossa or within knee peritoneum to suggest bile leak. There is a marked reflux of radiotracer from the proximal duodenum into the stomach. Ultimately, radiotracer does progress through into the small bowel.  IMPRESSION: 1. Negative for evidence of biliary leak. 2. Probable partial functional obstruction of the transverse duodenum. Marked reflux of excreted radiotracer from the duodenum into the stomach. Ultimately, radiotracer does progress into the  small bowel. This is not unexpected the giving the extensive secondary inflammatory changes of the descending and transverse duodenum seen on the CT scan from earlier today.   Electronically Signed   By: Jacqulynn Cadet M.D.   On: 04/07/2014 16:56   Ct Abdomen Pelvis W Contrast  04/07/2014   CLINICAL DATA:  Hiccups for 5 days with acid reflux. Recent gallbladder surgery.  EXAM: CT ABDOMEN AND PELVIS WITH CONTRAST  TECHNIQUE:  Multidetector CT imaging of the abdomen and pelvis was performed using the standard protocol following bolus administration of intravenous contrast.  CONTRAST:  154mL OMNIPAQUE IOHEXOL 300 MG/ML  SOLN  COMPARISON:  03/28/2014  FINDINGS: Lower chest: Lung bases show mild airspace disease in the lower lobes, left greater than right, with a small left pleural effusion. Heart size within normal limits. No pericardial effusion.  Hepatobiliary: 4 mm low-attenuation lesion in the left hepatic lobe is unchanged and likely a cyst or hemangioma. A low-attenuation fluid collection is seen in the gallbladder fossa after cholecystectomy, measuring 2.6 x 4.3 cm. Extrahepatic bile duct measures up to 7 mm with minimal intrahepatic biliary duct dilatation.  Pancreas: There are low attenuation areas in the pancreas, especially pancreatic tail, with an enlarged, edematous and heterogeneous pancreatic head and uncinate process. Findings are progressive from 03/28/2014. Surrounding peripancreatic inflammatory stranding and fluid.  Spleen: Negative.  Adrenals/Urinary Tract: Adrenal glands and right kidney are unremarkable. 1.3 cm fluid density lesion in the left kidney is unchanged and likely a cyst. 5 mm low-attenuation lesion in the lower pole left kidney is too small to characterize but also likely a cyst. Ureters are decompressed. Bladder is grossly unremarkable.  Stomach/Bowel: Stomach is unremarkable. Duodenal wall thickening is likely secondary to the adjacent pancreatic inflammatory changes. Small bowel, appendix and colon are otherwise unremarkable.  Vascular/Lymphatic: Scattered atherosclerotic calcification of the arterial vasculature without abdominal aortic aneurysm. Retroperitoneal lymph nodes measure up to 13 mm in the low left periaortic station, increased from 10 mm. Peripancreatic lymph nodes measure up to 11 mm, previously 9 mm.  Reproductive: Prostate is normal in size.  Other: Small bilateral inguinal hernias  contain fat, left greater than right. No free fluid. No free air.  Musculoskeletal: No worrisome lytic or sclerotic lesions.  IMPRESSION: 1. Worsening acute pancreatitis with evidence of necrosis in the pancreatic body and tail. 2. Fluid collection in the gallbladder fossa. Difficult to exclude a biloma or abscess after cholecystectomy. 3. Scattered peripancreatic and retroperitoneal adenopathy, most likely reactive. 4. Small left pleural effusion with airspace disease in the left lower lobe. Difficult to exclude pneumonia.   Electronically Signed   By: Lorin Picket M.D.   On: 04/07/2014 12:49   Ct Abdomen Pelvis W Contrast  03/28/2014   CLINICAL DATA:  Pt c/o upper abd pain x 4 days, fever, nausea and vomiting red blood. Hx of pancreatitis in Feb. No hx of CA. No hx of sx  EXAM: CT ABDOMEN AND PELVIS WITH CONTRAST  TECHNIQUE: Multidetector CT imaging of the abdomen and pelvis was performed using the standard protocol following bolus administration of intravenous contrast.  CONTRAST:  145mL OMNIPAQUE IOHEXOL 300 MG/ML  SOLN  COMPARISON:  07/11/2013  FINDINGS: There are changes of acute pancreatitis. The pancreas is heterogeneous with surrounding fluid and fat stranding. Enhancement is seen along the entirety the pancreas with some relative decreased enhancement along the periphery of the tail. Portal vein, superior mesenteric vein and splenic veins are patent. No discrete fluid collection is seen to suggest an  abscess or pseudocyst.  Small bilateral effusions. There is bilateral lung base atelectasis mostly in the dependent lower lobes.  Mild diffuse fatty infiltration of the liver. No liver mass or focal lesion. Normal spleen. Normal gallbladder. No bile duct dilation. No adrenal masses.  14 mm posterior midpole left renal cyst. Kidneys otherwise unremarkable. Normal ureters and bladder.  There are prominent nodes in the peripancreatic chain and along the gastrohepatic ligament. Several prominent left  retroperitoneal lymph nodes are seen. There are no pathologically enlarged nodes.  Trace ascites collects in the posterior pelvic recess and tracks along the anterior perirenal fascia and pericolic gutters.  There are several colonic diverticula. No diverticulitis. No bowel inflammatory change. Normal appendix visualized.  No osteoblastic or osteolytic lesions.  IMPRESSION: 1. Acute pancreatitis. No convincing pancreatic necrosis. No evidence of an abscess or pseudocyst. No venous thrombosis. Trace ascites. 2. No other acute findings. 3. Small bilateral effusions.  Lung base atelectasis. 4. Mild diffuse hepatic steatosis.   Electronically Signed   By: Lajean Manes M.D.   On: 03/28/2014 19:57   US Abdomen Limited  03/26/2014   CLINICAL DATA:  RIGHT upper quadrant pain.  History of pancreatitis.  EXAM: US ABDOMEN LIMITED - RIGHT UPPER QUADRANT  COMPARISON:  CT of the abdomen and pelvis July 19, 2013  FINDINGS: Gallbladder:  Multiple echogenic mobile gallstones measure up to at least 7 mm. No gallbladder wall thickening, pericholecystic fluid. No sonographic Murphy's sign was elicited.  Common bile duct:  Diameter: 5 mm  Liver:  No focal lesion identified. Within normal limits in parenchymal echogenicity. Hepatopetal portal vein.  Included view of the pancreas demonstrates ductal dilatation at 4 mm.  IMPRESSION: Cholelithiasis without sonographic findings of acute cholecystitis.  Mild pancreatic duct dilatation can be seen with acute or chronic pancreatitis.   Electronically Signed   By: Elon Alas   On: 03/26/2014 01:46     Today   Subjective:   Mason [name] today has no headache,no chest abdominal pain,no new weakness tingling or numbness, feels much better wants to go home today. Tolerated his diet very well.  Objective:   Blood pressure 104/66, pulse 88, temperature 98 F (36.7 C), temperature source Oral, resp. rate 18, height 6' (1.829 m), weight 84.3 kg (185 lb 13.6 oz), SpO2 98  %.  Intake/Output Summary (Last 24 hours) at 04/19/14 1606 Last data filed at 04/19/14 1323  Gross per 24 hour  Intake   1080 ml  Output   1001 ml  Net     79 ml    Exam Awake Alert, Oriented *3, No new F.N deficits, Normal affect [place].AT,PERRAL Supple Neck,No JVD, No cervical lymphadenopathy appriciated.  Symmetrical Chest wall movement, Good air movement bilaterally, CTAB RRR,No Gallops,Rubs or new Murmurs, No Parasternal Heave +ve B.Sounds, Abd Soft, Non tender, No organomegaly appriciated, No rebound -guarding or rigidity. No Cyanosis, Clubbing or edema, No new Rash or bruise  Data Review     CBC w Diff:  Lab Results  Component Value Date   WBC 6.2 04/19/2014   HGB 8.9* 04/19/2014   HCT 28.7* 04/19/2014   PLT 375 04/19/2014   LYMPHOPCT 6* 04/07/2014   MONOPCT 5 04/07/2014   EOSPCT 0 04/07/2014   BASOPCT 0 04/07/2014   CMP:  Lab Results  Component Value Date   NA 141 04/19/2014   K 4.4 04/19/2014   CL 105 04/19/2014   CO2 25 04/19/2014   BUN 8 04/19/2014   CREATININE 1.20 04/19/2014   PROT  6.0 04/10/2014   ALBUMIN 1.8* 04/10/2014   BILITOT 1.1 04/10/2014   ALKPHOS 56 04/10/2014   AST 24 04/10/2014   ALT 26 04/10/2014  .  Micro Results No results found for this or any previous visit (from the past 240 hour(s)).   Discharge Instructions          Follow-up Information    Follow up with London Pepper, MD. Call in 5 days.   Specialty:  Family Medicine   Contact information:   [address]  [address] [place] [address] [phone]       Follow up with Landry Dyke, MD. Call in 2 weeks.   Specialty:  Gastroenterology   Contact information:   [address], [place] [address] [phone]       Follow up with [facility] In 2 days.   Why:  This is your previously scheduled appointment from previous discharge, appointment is on 04/21/14, at 3:30 PM, but be present there at 3 PM   Contact information:    [address] [place]   [place] [address] [phone]       Discharge Medications     Medication List    STOP taking these medications        amoxicillin-clavulanate 875-125 MG per tablet  Commonly known as:  AUGMENTIN     ibuprofen 200 MG tablet  Commonly known as:  ADVIL,MOTRIN     omeprazole 20 MG capsule  Commonly known as:  PRILOSEC  Replaced by:  pantoprazole 40 MG tablet      TAKE these medications        albuterol 108 (90 BASE) MCG/ACT inhaler  Commonly known as:  PROVENTIL HFA;VENTOLIN HFA  Inhale 1-2 puffs into the lungs every 6 (six) hours as needed for wheezing or shortness of breath.     feeding supplement (RESOURCE BREEZE) Liqd  Take 1 Container by mouth 2 (two) times daily between meals.     fluticasone 220 MCG/ACT inhaler  Commonly known as:  FLOVENT HFA  Inhale 1 puff into the lungs 2 (two) times daily.     HYDROcodone-acetaminophen 5-325 MG per tablet  Commonly known as:  NORCO/VICODIN  Take 1 tablet by mouth every 4 (four) hours as needed for moderate pain.     hydrocortisone 25 MG suppository  Commonly known as:  ANUSOL-HC  Place 1 suppository (25 mg total) rectally 2 (two) times daily as needed for hemorrhoids or itching.     Pancrelipase (Lip-Prot-Amyl) 24000 UNITS Cpep  Take 2 capsules (48,000 Units total) by mouth 3 (three) times daily before meals.     pantoprazole 40 MG tablet  Commonly known as:  PROTONIX  Take 1 tablet (40 mg total) by mouth daily.         Total Time in preparing paper work, data evaluation and todays exam - 35 minutes  Ellory Khurana M.D on 04/19/2014 at 4:06 PM  [place]  [phone]

## 2014-04-19 NOTE — Discharge Instructions (Signed)
Follow with Primary MD London Pepper, MD in 5 days   Get CBC, CMP, 2 view Chest X ray checked  by Primary MD next visit.    Activity: As tolerated with Full fall precautions use walker/cane & assistance as needed   Disposition Home    Diet: Heart Healthy/low-fat/low carb/soft diet , with feeding assistance and aspiration precautions as needed. Patient is encouraged to have plenty of fluid intake.  For Heart failure patients - Check your Weight same time everyday, if you gain over 2 pounds, or you develop in leg swelling, experience more shortness of breath or chest pain, call your Primary MD immediately.    On your next visit with your primary care physician please Get Medicines reviewed and adjusted.   Please request your Prim.MD to go over all Hospital Tests and Procedure/Radiological results at the follow up, please get all Hospital records sent to your Prim MD by signing hospital release before you go home.   If you experience worsening of your admission symptoms, develop shortness of breath, life threatening emergency, suicidal or homicidal thoughts you must seek medical attention immediately by calling 911 or calling your MD immediately  if symptoms less severe.  You Must read complete instructions/literature along with all the possible adverse reactions/side effects for all the Medicines you take and that have been prescribed to you. Take any new Medicines after you have completely understood and accpet all the possible adverse reactions/side effects.   Do not drive, operating heavy machinery, perform activities at heights, swimming or participation in water activities or provide baby sitting services if your were admitted for syncope or siezures until you have seen by Primary MD or a Neurologist and advised to do so again.  Do not drive when taking Pain medications.    Do not take more than prescribed Pain, Sleep and Anxiety Medications  Special Instructions: If you have  smoked or chewed Tobacco  in the last 2 yrs please stop smoking, stop any regular Alcohol  and or any Recreational drug use.  Wear Seat belts while driving.   Please note  You were cared for by a hospitalist during your hospital stay. If you have any questions about your discharge medications or the care you received while you were in the hospital after you are discharged, you can call the unit and asked to speak with the hospitalist on call if the hospitalist that took care of you is not available. Once you are discharged, your primary care physician will handle any further medical issues. Please note that NO REFILLS for any discharge medications will be authorized once you are discharged, as it is imperative that you return to your primary care physician (or establish a relationship with a primary care physician if you do not have one) for your aftercare needs so that they can reassess your need for medications and monitor your lab values.

## 2014-04-19 NOTE — Significant Event (Signed)
Patient's wife stated she had not been told about the hospital problems listed on the discharge instruction form . Dr Waldron Labs called and he talked again with the wife to explain diagnosis and treatment during patient stay in [facility].

## 2014-04-19 NOTE — Consult Note (Deleted)
Mason [name], 68 y.o., DOB 09-24-1945, MRN [id number]. Admission date: 04/07/2014 Discharge Date 04/19/2014 Primary MD London Pepper, MD Admitting Physician Phillips Climes, MD  Admission Diagnosis  Ileus [K56.7] Abdominal pain [R10.9] Chest pain [R07.9] Other acute pancreatitis [K85.8] Fever, unspecified fever cause [R50.9]  Discharge Diagnosis   Active Problems:   Necrotizing pancreatitis   Sepsis   Malnutrition of moderate degree   GI bleed   Anemia      Past Medical History  Diagnosis Date  . Pancreatitis, acute 07/08/2013  . Gout attack 1990  . Seasonal allergies   . Seasonal asthma     "related to allergies"  . GERD (gastroesophageal reflux disease)     Past Surgical History  Procedure Laterality Date  . Circumcision  ~ 1974  . Eye examination under anesthesia w/ retinal cryotherapy and retinal laser Right 2014    "for tear"  . Cholecystectomy N/A 03/29/2014    Procedure: LAPAROSCOPIC CHOLECYSTECTOMY WITH INTRAOPERATIVE CHOLANGIOGRAM;  Surgeon: Coralie Keens, MD;  Location: [facility];  Service: General;  Laterality: N/A;   Admission history of present illness/brief narrative: Mason [name] is a 68 y.o. male status post laparoscopic cholecystectomy on 03/29/14 for gallstone pancreatitis. Treated with Primaxin in the hospital and discharged home on Augmentin. The patient returns with a complaint of fever for 48 hours and epigastric pain and is found to have necrosis of the pancreatic tail and related sepsis. He was clinically improving from this and was advanced to full liquids yesterday. However, he had a small bloody stool 11/23 morning and a larger bloody stool in the afternoon. GI has was consulted and was downgraded back to clear liquids. He received 1 unit of blood on 11/25. Patient diet was advanced to soft liquid which he tolerated very well, patient had recurrence of small amounts of lower GI bleed on 11/26, diet was downgraded again to clear liquid. Patient was seen by  Dr. Paulita Fujita with the plan for colonoscopy , but after further discussion with patient wife, she did not agree to colonoscopy, so procedure was canceled. As well this discussion was carried again by Dr. Amedeo Plenty who recommended the patient to have the colonoscopy, but again patient and wife has refused.  Discussed with wife on 04/18/14 and on day of discharge 04/19/14 regarding colonoscopy and again she declined for the procedure to be done, she wants to follow this as an outpatient when she feels he is more stable. Patient bleeding has significantly improved, didn't have any episodes over the last 24 hours, hemoglobin has been stable over the last 4 days after transfusion, it was felt it was secondary to hemorrhoidal bleed, patient was Hemoccult-negative at day of discharge.  Hospital Course See H&P, Labs, Consult and Test reports for all details in brief, patient was admitted for   Active Problems:   Necrotizing pancreatitis   Sepsis   Malnutrition of moderate degree   GI bleed   Anemia  Sepsis due to pancreatic necrosis - Was on Zosyn (11/17-11/26), a febrile, no leukocytosis, oral Cipro and Flagyl 11/27-11/29 - Leukocytosis has resolved -- Advanced to soft diet, which she is tolerating very well. - Per surgery team,they have added Creon to his medication regimen, will need to follow-up with him as an outpatient on his normal postop appointment.   GI bleed- anemia - 2 episodes of BRBPR on 11/23 and recurrent on 11/26 - Hgb had been decreasing steadily since admission but this was suspected to be dilutional as he had a positive fluid balance of 10L  due to aggressive hydration for pancreatic necrosis  - Transfused 1 U PRBC on 11/24- -GI has evaluated the patient, initial plan was for outpatient colonoscopy, but given the recurrent episodes of lower GI bleed, GI planned for inpatient colonoscopy, but wife has declined, discussion was carried few times with the patient and his wife, and they  declined colonoscopy until the day of discharge, so patient is being discharged home, with recommendation to follow up with GI as an outpatient in 2 weeks , patient's hemoglobin has been stable over the last 4 days after blood transfusion, there is no episode of bleed over the last 24 hours, the patient Hemoccult is negative at day of discharge.   Recent cholecystectomy -HIDA scan reveals no bile leak  Hypernatremia/ hypokalemia - Resolved   Thrombocytosis -Most likely due to acute inflammation/necrosis -Resolved   Consults  Gastroenterology Surgery  Significant Tests:  See full reports for all details    Dg Chest 2 View  04/07/2014   CLINICAL DATA:  Short of breath with exertion  EXAM: CHEST  2 VIEW  COMPARISON:  Chest x-ray of 03/31/2014  FINDINGS: Streaky opacity remains primarily at the left lung base which may represent scarring, atelectasis, or possibly residual pneumonia. The right lung is clear. The heart is within normal limits in size. No mediastinal or hilar abnormality is seen. No bony abnormality is noted.  IMPRESSION: Persistent left basilar opacity may represent atelectasis, scarring, or possible residual pneumonia.   Electronically Signed   By: Ivar Drape M.D.   On: 04/07/2014 13:38   Dg Chest 2 View  03/31/2014   CLINICAL DATA:  Postop fever following cholecystectomy.  EXAM: CHEST  2 VIEW  COMPARISON:  07/16/2013  FINDINGS: Bibasilar atelectasis, similar to prior study. Suspect small effusions, also stable. Heart is upper limits normal in size. Mediastinal contours are within normal limits. No acute bony abnormality.  IMPRESSION: Stable bibasilar opacities, likely atelectasis with small effusions.   Electronically Signed   By: Rolm Baptise M.D.   On: 03/31/2014 09:57   Dg Cholangiogram Operative  03/29/2014   CLINICAL DATA:  Cholecystitis, laparoscopic cholecystectomy  EXAM: INTRAOPERATIVE CHOLANGIOGRAM  TECHNIQUE: Cholangiographic images from the C-arm fluoroscopic  device were submitted for interpretation post-operatively. Please see the procedural report for the amount of contrast and the fluoroscopy time utilized.  COMPARISON:  CT 03/28/2014  FINDINGS: Cholecystectomy clips are noted. Normal caliber common and intrahepatic ducts. No filling defect. Prompt excretion into the duodenum is noted.  IMPRESSION: Expected appearance of intraoperative cholangiogram.   Electronically Signed   By: Conchita Paris M.D.   On: 03/29/2014 12:44   Nm Hepatobiliary Liver Func  04/07/2014   CLINICAL DATA:  68 year old male status post cholecystectomy on 03/29/2014. Fluid collection within the gallbladder fossa concerning for possible biliary leak.  EXAM: NUCLEAR MEDICINE HEPATOBILIARY IMAGING  TECHNIQUE: Sequential images of the abdomen were obtained out to 60 minutes following intravenous administration of radiopharmaceutical.  RADIOPHARMACEUTICALS:  5 [employer] HA-19F Choletec  COMPARISON:  CT scan of the abdomen and pelvis 04/07/2014  FINDINGS: No evidence of abnormal collection of radiotracer in the gallbladder fossa or within knee peritoneum to suggest bile leak. There is a marked reflux of radiotracer from the proximal duodenum into the stomach. Ultimately, radiotracer does progress through into the small bowel.  IMPRESSION: 1. Negative for evidence of biliary leak. 2. Probable partial functional obstruction of the transverse duodenum. Marked reflux of excreted radiotracer from the duodenum into the stomach. Ultimately, radiotracer does progress into the  small bowel. This is not unexpected the giving the extensive secondary inflammatory changes of the descending and transverse duodenum seen on the CT scan from earlier today.   Electronically Signed   By: Jacqulynn Cadet M.D.   On: 04/07/2014 16:56   Ct Abdomen Pelvis W Contrast  04/07/2014   CLINICAL DATA:  Hiccups for 5 days with acid reflux. Recent gallbladder surgery.  EXAM: CT ABDOMEN AND PELVIS WITH CONTRAST  TECHNIQUE:  Multidetector CT imaging of the abdomen and pelvis was performed using the standard protocol following bolus administration of intravenous contrast.  CONTRAST:  149mL OMNIPAQUE IOHEXOL 300 MG/ML  SOLN  COMPARISON:  03/28/2014  FINDINGS: Lower chest: Lung bases show mild airspace disease in the lower lobes, left greater than right, with a small left pleural effusion. Heart size within normal limits. No pericardial effusion.  Hepatobiliary: 4 mm low-attenuation lesion in the left hepatic lobe is unchanged and likely a cyst or hemangioma. A low-attenuation fluid collection is seen in the gallbladder fossa after cholecystectomy, measuring 2.6 x 4.3 cm. Extrahepatic bile duct measures up to 7 mm with minimal intrahepatic biliary duct dilatation.  Pancreas: There are low attenuation areas in the pancreas, especially pancreatic tail, with an enlarged, edematous and heterogeneous pancreatic head and uncinate process. Findings are progressive from 03/28/2014. Surrounding peripancreatic inflammatory stranding and fluid.  Spleen: Negative.  Adrenals/Urinary Tract: Adrenal glands and right kidney are unremarkable. 1.3 cm fluid density lesion in the left kidney is unchanged and likely a cyst. 5 mm low-attenuation lesion in the lower pole left kidney is too small to characterize but also likely a cyst. Ureters are decompressed. Bladder is grossly unremarkable.  Stomach/Bowel: Stomach is unremarkable. Duodenal wall thickening is likely secondary to the adjacent pancreatic inflammatory changes. Small bowel, appendix and colon are otherwise unremarkable.  Vascular/Lymphatic: Scattered atherosclerotic calcification of the arterial vasculature without abdominal aortic aneurysm. Retroperitoneal lymph nodes measure up to 13 mm in the low left periaortic station, increased from 10 mm. Peripancreatic lymph nodes measure up to 11 mm, previously 9 mm.  Reproductive: Prostate is normal in size.  Other: Small bilateral inguinal hernias  contain fat, left greater than right. No free fluid. No free air.  Musculoskeletal: No worrisome lytic or sclerotic lesions.  IMPRESSION: 1. Worsening acute pancreatitis with evidence of necrosis in the pancreatic body and tail. 2. Fluid collection in the gallbladder fossa. Difficult to exclude a biloma or abscess after cholecystectomy. 3. Scattered peripancreatic and retroperitoneal adenopathy, most likely reactive. 4. Small left pleural effusion with airspace disease in the left lower lobe. Difficult to exclude pneumonia.   Electronically Signed   By: Lorin Picket M.D.   On: 04/07/2014 12:49   Ct Abdomen Pelvis W Contrast  03/28/2014   CLINICAL DATA:  Pt c/o upper abd pain x 4 days, fever, nausea and vomiting red blood. Hx of pancreatitis in Feb. No hx of CA. No hx of sx  EXAM: CT ABDOMEN AND PELVIS WITH CONTRAST  TECHNIQUE: Multidetector CT imaging of the abdomen and pelvis was performed using the standard protocol following bolus administration of intravenous contrast.  CONTRAST:  16mL OMNIPAQUE IOHEXOL 300 MG/ML  SOLN  COMPARISON:  07/11/2013  FINDINGS: There are changes of acute pancreatitis. The pancreas is heterogeneous with surrounding fluid and fat stranding. Enhancement is seen along the entirety the pancreas with some relative decreased enhancement along the periphery of the tail. Portal vein, superior mesenteric vein and splenic veins are patent. No discrete fluid collection is seen to suggest an  abscess or pseudocyst.  Small bilateral effusions. There is bilateral lung base atelectasis mostly in the dependent lower lobes.  Mild diffuse fatty infiltration of the liver. No liver mass or focal lesion. Normal spleen. Normal gallbladder. No bile duct dilation. No adrenal masses.  14 mm posterior midpole left renal cyst. Kidneys otherwise unremarkable. Normal ureters and bladder.  There are prominent nodes in the peripancreatic chain and along the gastrohepatic ligament. Several prominent left  retroperitoneal lymph nodes are seen. There are no pathologically enlarged nodes.  Trace ascites collects in the posterior pelvic recess and tracks along the anterior perirenal fascia and pericolic gutters.  There are several colonic diverticula. No diverticulitis. No bowel inflammatory change. Normal appendix visualized.  No osteoblastic or osteolytic lesions.  IMPRESSION: 1. Acute pancreatitis. No convincing pancreatic necrosis. No evidence of an abscess or pseudocyst. No venous thrombosis. Trace ascites. 2. No other acute findings. 3. Small bilateral effusions.  Lung base atelectasis. 4. Mild diffuse hepatic steatosis.   Electronically Signed   By: Lajean Manes M.D.   On: 03/28/2014 19:57   US Abdomen Limited  03/26/2014   CLINICAL DATA:  RIGHT upper quadrant pain.  History of pancreatitis.  EXAM: US ABDOMEN LIMITED - RIGHT UPPER QUADRANT  COMPARISON:  CT of the abdomen and pelvis July 19, 2013  FINDINGS: Gallbladder:  Multiple echogenic mobile gallstones measure up to at least 7 mm. No gallbladder wall thickening, pericholecystic fluid. No sonographic Murphy's sign was elicited.  Common bile duct:  Diameter: 5 mm  Liver:  No focal lesion identified. Within normal limits in parenchymal echogenicity. Hepatopetal portal vein.  Included view of the pancreas demonstrates ductal dilatation at 4 mm.  IMPRESSION: Cholelithiasis without sonographic findings of acute cholecystitis.  Mild pancreatic duct dilatation can be seen with acute or chronic pancreatitis.   Electronically Signed   By: Elon Alas   On: 03/26/2014 01:46     Today   Subjective:   Mason [name] today has no headache,no chest abdominal pain,no new weakness tingling or numbness, feels much better wants to go home today. Tolerated his diet very well.  Objective:   Blood pressure 100/70, pulse 85, temperature 97.9 F (36.6 C), temperature source Oral, resp. rate 16, height 6' (1.829 m), weight 84.3 kg (185 lb 13.6 oz), SpO2 95  %.  Intake/Output Summary (Last 24 hours) at 04/19/14 1345 Last data filed at 04/19/14 1323  Gross per 24 hour  Intake   1080 ml  Output   1001 ml  Net     79 ml    Exam Awake Alert, Oriented *3, No new F.N deficits, Normal affect [place].AT,PERRAL Supple Neck,No JVD, No cervical lymphadenopathy appriciated.  Symmetrical Chest wall movement, Good air movement bilaterally, CTAB RRR,No Gallops,Rubs or new Murmurs, No Parasternal Heave +ve B.Sounds, Abd Soft, Non tender, No organomegaly appriciated, No rebound -guarding or rigidity. No Cyanosis, Clubbing or edema, No new Rash or bruise  Data Review     CBC w Diff:  Lab Results  Component Value Date   WBC 6.2 04/19/2014   HGB 8.9* 04/19/2014   HCT 28.7* 04/19/2014   PLT 375 04/19/2014   LYMPHOPCT 6* 04/07/2014   MONOPCT 5 04/07/2014   EOSPCT 0 04/07/2014   BASOPCT 0 04/07/2014   CMP:  Lab Results  Component Value Date   NA 141 04/19/2014   K 4.4 04/19/2014   CL 105 04/19/2014   CO2 25 04/19/2014   BUN 8 04/19/2014   CREATININE 1.20 04/19/2014   PROT  6.0 04/10/2014   ALBUMIN 1.8* 04/10/2014   BILITOT 1.1 04/10/2014   ALKPHOS 56 04/10/2014   AST 24 04/10/2014   ALT 26 04/10/2014  .  Micro Results No results found for this or any previous visit (from the past 240 hour(s)).   Discharge Instructions          Follow-up Information    Follow up with London Pepper, MD. Call in 5 days.   Specialty:  Family Medicine   Contact information:   [address]  [address] [place] [address] [phone]       Follow up with Landry Dyke, MD. Call in 2 weeks.   Specialty:  Gastroenterology   Contact information:   [address] [place] [address] [phone]       Follow up with [facility] In 2 days.   Why:  This is your previously scheduled appointment from previous discharge, appointment is on 04/21/14, at 3:30 PM, but be present there at 3 PM   Contact information:    [address] [place]   [place] [address] [phone]       Discharge Medications     Medication List    STOP taking these medications        amoxicillin-clavulanate 875-125 MG per tablet  Commonly known as:  AUGMENTIN     ibuprofen 200 MG tablet  Commonly known as:  ADVIL,MOTRIN     omeprazole 20 MG capsule  Commonly known as:  PRILOSEC  Replaced by:  pantoprazole 40 MG tablet      TAKE these medications        albuterol 108 (90 BASE) MCG/ACT inhaler  Commonly known as:  PROVENTIL HFA;VENTOLIN HFA  Inhale 1-2 puffs into the lungs every 6 (six) hours as needed for wheezing or shortness of breath.     feeding supplement (RESOURCE BREEZE) Liqd  Take 1 Container by mouth 2 (two) times daily between meals.     fluticasone 220 MCG/ACT inhaler  Commonly known as:  FLOVENT HFA  Inhale 1 puff into the lungs 2 (two) times daily.     HYDROcodone-acetaminophen 5-325 MG per tablet  Commonly known as:  NORCO/VICODIN  Take 1 tablet by mouth every 4 (four) hours as needed for moderate pain.     hydrocortisone 25 MG suppository  Commonly known as:  ANUSOL-HC  Place 1 suppository (25 mg total) rectally 2 (two) times daily as needed for hemorrhoids or itching.     Pancrelipase (Lip-Prot-Amyl) 24000 UNITS Cpep  Take 2 capsules (48,000 Units total) by mouth 3 (three) times daily before meals.     pantoprazole 40 MG tablet  Commonly known as:  PROTONIX  Take 1 tablet (40 mg total) by mouth daily.         Total Time in preparing paper work, data evaluation and todays exam - 35 minutes  Anahid Eskelson M.D on 04/19/2014 at 1:45 PM  [place]  [phone]

## 2014-04-20 DIAGNOSIS — Z9889 Other specified postprocedural states: Secondary | ICD-10-CM | POA: Diagnosis not present

## 2014-04-20 DIAGNOSIS — R1084 Generalized abdominal pain: Secondary | ICD-10-CM | POA: Diagnosis not present

## 2014-04-20 DIAGNOSIS — K922 Gastrointestinal hemorrhage, unspecified: Secondary | ICD-10-CM | POA: Diagnosis not present

## 2014-04-20 DIAGNOSIS — K625 Hemorrhage of anus and rectum: Secondary | ICD-10-CM | POA: Diagnosis not present

## 2014-04-26 DIAGNOSIS — D649 Anemia, unspecified: Secondary | ICD-10-CM | POA: Diagnosis not present

## 2014-04-26 DIAGNOSIS — K921 Melena: Secondary | ICD-10-CM | POA: Diagnosis not present

## 2014-04-28 DIAGNOSIS — K851 Biliary acute pancreatitis: Secondary | ICD-10-CM | POA: Diagnosis not present

## 2014-04-28 DIAGNOSIS — K625 Hemorrhage of anus and rectum: Secondary | ICD-10-CM | POA: Diagnosis not present

## 2014-04-28 DIAGNOSIS — K859 Acute pancreatitis without necrosis or infection, unspecified: Secondary | ICD-10-CM | POA: Insufficient documentation

## 2014-06-22 DIAGNOSIS — K862 Cyst of pancreas: Secondary | ICD-10-CM | POA: Insufficient documentation

## 2014-07-28 DIAGNOSIS — I82891 Chronic embolism and thrombosis of other specified veins: Secondary | ICD-10-CM | POA: Insufficient documentation

## 2017-11-26 ENCOUNTER — Other Ambulatory Visit: Payer: Self-pay | Admitting: Family Medicine

## 2017-11-26 ENCOUNTER — Ambulatory Visit
Admission: RE | Admit: 2017-11-26 | Discharge: 2017-11-26 | Disposition: A | Discharge status: Home / Self Care | Payer: Medicare Other | Source: Ambulatory Visit | Attending: Family Medicine | Admitting: Family Medicine

## 2017-11-26 DIAGNOSIS — J45909 Unspecified asthma, uncomplicated: Secondary | ICD-10-CM

## 2019-04-15 ENCOUNTER — Ambulatory Visit: Payer: Self-pay | Admitting: Allergy & Immunology

## 2019-04-29 ENCOUNTER — Other Ambulatory Visit: Payer: Self-pay

## 2019-04-29 ENCOUNTER — Encounter: Payer: Self-pay | Admitting: Allergy & Immunology

## 2019-04-29 ENCOUNTER — Ambulatory Visit: Payer: Medicare Other | Admitting: Allergy & Immunology

## 2019-04-29 ENCOUNTER — Ambulatory Visit: Payer: Self-pay | Admitting: Allergy & Immunology

## 2019-04-29 VITALS — BP 118/82 | HR 105 | Temp 97.5°F | Resp 20 | Ht 70.5 in | Wt 177.8 lb

## 2019-04-29 DIAGNOSIS — J310 Chronic rhinitis: Secondary | ICD-10-CM | POA: Diagnosis not present

## 2019-04-29 DIAGNOSIS — J4550 Severe persistent asthma, uncomplicated: Secondary | ICD-10-CM

## 2019-04-29 MED ORDER — PREDNISONE 10 MG PO TABS: ORAL_TABLET | ORAL | 0 refills | Status: DC

## 2019-04-29 MED ORDER — METHYLPREDNISOLONE ACETATE 80 MG/ML IJ SUSP
80.0000 mg | Freq: Once | INTRAMUSCULAR | Status: AC
  Administered 2019-04-29: 80 mg via INTRAMUSCULAR

## 2019-04-29 MED ORDER — TRELEGY ELLIPTA 200-62.5-25 MCG/INH IN AEPB: 1.0000 | INHALATION_SPRAY | Freq: Every day | RESPIRATORY_TRACT | 5 refills | Status: DC

## 2019-04-29 NOTE — Patient Instructions (Addendum)
1. Severe persistent asthma, uncomplicated - Lung testing was in the 20-30% range today.  - It did recover fairly well with the nebulizer treatments. - We are going to get some labs to look for serious causes of difficult to control asthma. - We willalso see if you qualify for one of our biologic (injectable) medications for asthma.  - We are ordering labs, so please allow 1-2 weeks for the results to come back. - With the newly implemented Cures Act, the labs might be visible to you at the same time that they become visible to me. - However, I will not address the results until all of the results come  back, so please be patient.  - DepoMedrol 80mg  IM given today. - Start the prednisone burst (will be sent to pharmacy): Take 3 tabs (30mg ) twice daily for 3 days, then 2 tabs (20mg ) twice daily for 3 days, then 1 tab (10mg ) twice daily for 3 days, then STOP. - Stop the Wixela and start Trelegy one puff once daily (this contains three medications to help with asthma control).  - Daily controller medication(s): Trelegy 200/62.5/25 one puff once daily - Prior to physical activity: albuterol 2 puffs 10-15 minutes before physical activity. - Rescue medications: albuterol 4 puffs every 4-6 hours as needed - Asthma control goals:  * Full participation in all desired activities (may need albuterol before activity) * Albuterol use two time or less a week on average (not counting use with activity) * Cough interfering with sleep two time or less a month * Oral steroids no more than once a year * No hospitalizations  2. Chronic rhinitis - We are going to get some lab work to check on your allergic sensitizations. - We can provide avoidance measures once we get the testing results back. - Start taking: Xyzal (levocetirizine) 5mg  tablet once daily - You can use an extra dose of the antihistamine, if needed, for breakthrough symptoms.  - Consider nasal saline rinses 1-2 times daily to remove allergens  from the nasal cavities as well as help with mucous clearance (this is especially helpful to do before the nasal sprays are given)  3. Return in about 2 weeks (around 05/13/2019). This can be an in-person, a virtual Webex or a telephone follow up visit.   Please inform us of any Emergency Department visits, hospitalizations, or changes in symptoms. Call us before going to the ED for breathing or allergy symptoms since we might be able to fit you in for a sick visit. Feel free to contact us anytime with any questions, problems, or concerns.  It was a pleasure to meet you today!  Websites that have reliable patient information: 1. American Academy of Asthma, Allergy, and Immunology: www.aaaai.org 2. Food Allergy Research and Education (FARE): foodallergy.org 3. Mothers of Asthmatics: http://www.asthmacommunitynetwork.org 4. American College of Allergy, Asthma, and Immunology: www.acaai.org  "Like" [place] on Facebook and Instagram for our latest updates!      Make sure you are registered to vote! If you have moved or changed any of your contact information, you will need to get this updated before voting!  In some cases, you MAY be able to register to vote online: [email or web address]

## 2019-04-29 NOTE — Progress Notes (Signed)
NEW PATIENT  Date of Service/Encounter:  04/29/19  Referring provider: London Pepper, MD   Assessment:   Severe persistent asthma, uncomplicated  Chronic rhinitis  Plan/Recommendations:   1. Severe persistent asthma, uncomplicated - Lung testing was in the 20-30% range today.  - It did recover fairly well with the nebulizer treatments. - We are going to get some labs to look for serious causes of difficult to control asthma. - We will also see if you qualify for one of our biologic (injectable) medications for asthma.  - We are ordering labs, so please allow 1-2 weeks for the results to come back. - With the newly implemented Cures Act, the labs might be visible to you at the same time that they become visible to me. - However, I will not address the results until all of the results come  back, so please be patient.  - DepoMedrol 80mg  IM given today. - Start the prednisone burst (will be sent to pharmacy): Take 3 tabs (30mg ) twice daily for 3 days, then 2 tabs (20mg ) twice daily for 3 days, then 1 tab (10mg ) twice daily for 3 days, then STOP. - Stop the Wixela and start Trelegy one puff once daily (this contains three medications to help with asthma control).  - Daily controller medication(s): Trelegy 200/62.5/25 one puff once daily - Prior to physical activity: albuterol 2 puffs 10-15 minutes before physical activity. - Rescue medications: albuterol 4 puffs every 4-6 hours as needed - Asthma control goals:  * Full participation in all desired activities (may need albuterol before activity) * Albuterol use two time or less a week on average (not counting use with activity) * Cough interfering with sleep two time or less a month * Oral steroids no more than once a year * No hospitalizations  2. Chronic rhinitis - We are going to get some lab work to check on your allergic sensitizations. - We can provide avoidance measures once we get the testing results back. - Start  taking: Xyzal (levocetirizine) 5mg  tablet once daily - You can use an extra dose of the antihistamine, if needed, for breakthrough symptoms.  - Consider nasal saline rinses 1-2 times daily to remove allergens from the nasal cavities as well as help with mucous clearance (this is especially helpful to do before the nasal sprays are given)  3. Return in about 2 weeks (around 05/13/2019). This can be an in-person, a virtual Webex or a telephone follow up visit.  Subjective:   Mason [name] is a 73 y.o. male presenting today for evaluation of  Chief Complaint  Patient presents with  . Shortness of Breath  . Wheezing  . Cough    waking up during the night, more than 2-3 nights per week    Mason [name] has a history of the following: Patient Active Problem List   Diagnosis Date Noted  . GI bleed 04/14/2014  . Anemia 04/14/2014  . Malnutrition of moderate degree ([place]) 04/09/2014  . Sepsis ([place]) 04/08/2014  . Abnormal abdominal CT scan 04/07/2014  . Right otitis media 04/02/2014  . Fever 04/01/2014  . Necrotizing pancreatitis 03/26/2014  . Cholelithiasis 03/26/2014  . History of alcohol use, denies any recent alcohol use 03/26/2014  . Pancreatitis, gallstone   . SOB (shortness of breath) 07/17/2013  . Intermittent fever 07/17/2013  . Seasonal asthma 07/17/2013  . Fever, unspecified 07/11/2013  . Constipation 07/11/2013  . Cardiomegaly 07/10/2013  . Abnormal ultrasound of abdomen 07/09/2013  . Abdominal pain, acute, periumbilical  07/08/2013  . Nausea & vomiting 07/08/2013  . Overweight (BMI 25.0-29.9) 07/08/2013    History obtained from: chart review and patient.  Mason [name] was referred by London Pepper, MD.     Mason [name] is a 73 y.o. male presenting for an evaluation of breathing problems and allergies.  He spends a lot of time traveling since he is retired. He apparently has been living in [place] for a while and lived in [place] for a period of time as well. He tells me  that he and his wife "like to live". They do have one house here in [place] but seem to spend a lot of time out of state.    Asthma/Respiratory Symptom History: He has been diagnosed with asthma since 1982. He does not remember who diagnosed him with that. Currently he is on [place] and he has albuterol as needed. He is a never smoker. He uses the the albuterol only during the cold seasons. He takes it twice daily. He denies the need for any courses of prednisone. He does have night time symptoms fairly frequently and takes breaks to participate in ADLs. He has never been admitted to the hospital for breathing problems and has never been intubated for breathing problems.   Allergic Rhinitis Symptom History: He was tested when he saw someone here in 1982. He tells me that he did "light up like a Christmas tree". He never received allergy shots at all. He does tell me that he got one shot when he was tested (presumably a steroid shot). He does have an allergy pill that he takes a times, but this is not regularly. He does not use a nose spray at all. Antibiotics are rarely, if ever, used for sinus infections.  Otherwise, there is no history of other atopic diseases, including food allergies, drug allergies, stinging insect allergies, eczema, urticaria or contact dermatitis. There is no significant infectious history. Vaccinations are up to date.    Past Medical History: Patient Active Problem List   Diagnosis Date Noted  . GI bleed 04/14/2014  . Anemia 04/14/2014  . Malnutrition of moderate degree ([place]) 04/09/2014  . Sepsis ([place]) 04/08/2014  . Abnormal abdominal CT scan 04/07/2014  . Right otitis media 04/02/2014  . Fever 04/01/2014  . Necrotizing pancreatitis 03/26/2014  . Cholelithiasis 03/26/2014  . History of alcohol use, denies any recent alcohol use 03/26/2014  . Pancreatitis, gallstone   . SOB (shortness of breath) 07/17/2013  . Intermittent fever 07/17/2013  . Seasonal asthma  07/17/2013  . Fever, unspecified 07/11/2013  . Constipation 07/11/2013  . Cardiomegaly 07/10/2013  . Abnormal ultrasound of abdomen 07/09/2013  . Abdominal pain, acute, periumbilical 123XX123  . Nausea & vomiting 07/08/2013  . Overweight (BMI 25.0-29.9) 07/08/2013    Medication List:  Allergies as of 04/29/2019   No Known Allergies     Medication List       Accurate as of April 29, 2019 11:18 AM. If you have any questions, ask your nurse or doctor.        STOP taking these medications   feeding supplement Liqd Stopped by: Valentina Shaggy, MD   HYDROcodone-acetaminophen 5-325 MG tablet Commonly known as: NORCO/VICODIN Stopped by: Valentina Shaggy, MD   hydrocortisone 25 MG suppository Commonly known as: ANUSOL-HC Stopped by: Valentina Shaggy, MD   Pancrelipase (Lip-Prot-Amyl) [id number] units Cpep Stopped by: Valentina Shaggy, MD   pantoprazole 40 MG tablet Commonly known as: PROTONIX Stopped by: Valentina Shaggy, MD  TAKE these medications   albuterol 108 (90 Base) MCG/ACT inhaler Commonly known as: VENTOLIN HFA Inhale 1-2 puffs into the lungs every 6 (six) hours as needed for wheezing or shortness of breath.   fluticasone 220 MCG/ACT inhaler Commonly known as: FLOVENT HFA Inhale 1 puff into the lungs 2 (two) times daily.   predniSONE 10 MG tablet Commonly known as: DELTASONE Take 3 tabs (30mg ) twice daily for 3 days, then 2 tabs (20mg ) twice daily for 3 days, then 1 tab (10mg ) twice daily for 3 days, then STOP. Started by: Valentina Shaggy, MD   Trelegy Cleaster Corin 200-62.5-25 MCG/INH Aepb Generic drug: Fluticasone-Umeclidin-Vilant Inhale 1 puff into the lungs daily. Started by: Valentina Shaggy, MD   Wixela Inhub 500-50 MCG/DOSE Aepb Generic drug: Fluticasone-Salmeterol SMARTSIG:1 Puff(s) By Mouth 1 to 2 Times Daily       Birth History: non-contributory  Developmental History: non-contributory  Past Surgical  History: Past Surgical History:  Procedure Laterality Date  . CHOLECYSTECTOMY N/A 03/29/2014   Procedure: LAPAROSCOPIC CHOLECYSTECTOMY WITH INTRAOPERATIVE CHOLANGIOGRAM;  Surgeon: Coralie Keens, MD;  Location: [facility];  Service: General;  Laterality: N/A;  . CIRCUMCISION  ~ 1974  . EYE EXAMINATION UNDER ANESTHESIA W/ RETINAL CRYOTHERAPY AND RETINAL LASER Right 2014   "for tear"     Family History: History reviewed. No pertinent family history.   Social History: Kyden lives at home with his wife [place].  He lives in a house that is 72 years old.  There is wood in the main living areas and carpeting in the bedrooms.  They have electric heating and central cooling.  There are no animals inside or outside of the home.  He does not have dust mite covers on the bedding.  There is no tobacco exposure.  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  Prior to that, he worked for 28 years in the [employer]. His wife was a Pharmacist, hospital with the [employer].    Review of Systems  Constitutional: Negative.  Negative for chills, fever, malaise/fatigue and weight loss.  HENT: Negative.  Negative for congestion, ear discharge, ear pain and sore throat.   Eyes: Negative for pain, discharge and redness.  Respiratory: Positive for shortness of breath and wheezing. Negative for cough and sputum production.   Cardiovascular: Negative.  Negative for chest pain and palpitations.  Gastrointestinal: Negative for abdominal pain, constipation, diarrhea, heartburn, nausea and vomiting.  Skin: Negative.  Negative for itching and rash.  Neurological: Negative for dizziness and headaches.  Endo/Heme/Allergies: Negative for environmental allergies. Does not bruise/bleed easily.       Objective:   Blood pressure 118/82, pulse (!) 105, temperature (!) 97.5 F (36.4 C), temperature source Temporal, resp. rate 20, height 5' 10.5"  (1.791 m), weight 177 lb 12.8 oz (80.6 kg), SpO2 (!) 87 %. Body mass index is 25.15 kg/m.   Physical Exam:   Physical Exam  Constitutional: He appears well-developed.  Pleasant male. Happy go lucky personality.   HENT:  Head: Normocephalic and atraumatic.  Right Ear: Tympanic membrane, external ear and ear canal normal. No drainage, swelling or tenderness. Tympanic membrane is not injected, not scarred, not erythematous, not retracted and not bulging.  Left Ear: Tympanic membrane, external ear and ear canal normal. No drainage, swelling or tenderness. Tympanic membrane is not injected, not scarred, not erythematous, not retracted and not bulging.  Nose: No mucosal edema, rhinorrhea, nasal deformity or septal deviation.  No epistaxis. Right sinus exhibits no maxillary sinus tenderness and no frontal sinus tenderness. Left sinus exhibits no maxillary sinus tenderness and no frontal sinus tenderness.  Mouth/Throat: Uvula is midline and oropharynx is clear and moist. Mucous membranes are not pale and not dry.  Eyes: Pupils are equal, round, and reactive to light. Conjunctivae and EOM are normal. Right eye exhibits no chemosis and no discharge. Left eye exhibits no chemosis and no discharge. Right conjunctiva is not injected. Left conjunctiva is not injected.  Cardiovascular: Normal rate, regular rhythm and normal heart sounds.  Respiratory: Effort normal and breath sounds normal. No accessory muscle usage. No tachypnea. No respiratory distress. He has no wheezes. He has no rhonchi. He has no rales. He exhibits no tenderness.  Marked inspiratory and expiratory wheezes throughout. These did clear following his first nebulizer treatment. There are no crackles noted. He seems to be breathing comfortably. th  GI: There is no abdominal tenderness. There is no rebound and no guarding.  Lymphadenopathy:       Head (right side): No submandibular, no tonsillar and no occipital adenopathy present.       Head  (left side): No submandibular, no tonsillar and no occipital adenopathy present.    He has no cervical adenopathy.  Neurological: He is alert.  Skin: No abrasion, no petechiae and no rash noted. Rash is not papular, not vesicular and not urticarial. No erythema. No pallor.  Psychiatric: He has a normal mood and affect.     Diagnostic studies:    Spirometry: results abnormal (FEV1: 0.83/28%, FVC: 1.47/37%, FEV1/FVC: 56%).    Spirometry consistent with mixed obstructive and restrictive disease. Xopenex/Atrovent nebulizer treatment given in clinic with significant improvement in FEV1 and FVC per ATS criteria. His FEV1 increased from 0.83 to 1.01 and his FVC increased from 1.47 to 2.02. Both of these were significant per ATS criteria.  Allergy Studies: none (labs sent instead)          Salvatore Marvel, MD [facility]

## 2019-05-02 LAB — IGE+ALLERGENS ZONE 2(30)
Alternaria Alternata IgE: <0.1 kU/L
Amer Sycamore IgE Qn: <0.1 kU/L
Aspergillus Fumigatus IgE: <0.1 kU/L
Bahia Grass IgE: 0.61 kU/L — AB
Bermuda Grass IgE: 0.1 kU/L — AB
Cat Dander IgE: 4.62 kU/L — AB
Cedar, Mountain IgE: <0.1 kU/L
Cladosporium Herbarum IgE: <0.1 kU/L
Cockroach, American IgE: <0.1 kU/L
Common Silver Birch IgE: 0.53 kU/L — AB
D Farinae IgE: 0.16 kU/L — AB
D Pteronyssinus IgE: 0.17 kU/L — AB
Dog Dander IgE: 21.8 kU/L — AB
Elm, American IgE: <0.1 kU/L
Hickory, White IgE: 0.31 kU/L — AB
IgE (Immunoglobulin E), Serum: 133 IU/mL (ref 6–495)
Johnson Grass IgE: 0.29 kU/L — AB
Maple/Box Elder IgE: <0.1 kU/L
Mucor Racemosus IgE: <0.1 kU/L
Mugwort IgE Qn: <0.1 kU/L
Nettle IgE: <0.1 kU/L
Oak, White IgE: 2.29 kU/L — AB
Penicillium Chrysogen IgE: <0.1 kU/L
Pigweed, Rough IgE: <0.1 kU/L
Plantain, English IgE: <0.1 kU/L
Ragweed, Short IgE: 0.12 kU/L — AB
Sheep Sorrel IgE Qn: <0.1 kU/L
Stemphylium Herbarum IgE: <0.1 kU/L
Sweet gum IgE RAST Ql: <0.1 kU/L
Timothy Grass IgE: 1.38 kU/L — AB
White Mulberry IgE: <0.1 kU/L

## 2019-05-02 LAB — CBC WITH DIFFERENTIAL/PLATELET
Basophils Absolute: 0.1 10*3/uL (ref 0.0–0.2)
Basos: 2 %
EOS (ABSOLUTE): 0 10*3/uL (ref 0.0–0.4)
Eos: 0 %
Hematocrit: 49.1 % (ref 37.5–51.0)
Hemoglobin: 17.3 g/dL (ref 13.0–17.7)
Immature Grans (Abs): 0 10*3/uL (ref 0.0–0.1)
Immature Granulocytes: 0 %
Lymphocytes Absolute: 1.4 10*3/uL (ref 0.7–3.1)
Lymphs: 33 %
MCH: 34.5 pg — ABNORMAL HIGH (ref 26.6–33.0)
MCHC: 35.2 g/dL (ref 31.5–35.7)
MCV: 98 fL — ABNORMAL HIGH (ref 79–97)
Monocytes Absolute: 0.3 10*3/uL (ref 0.1–0.9)
Monocytes: 8 %
Neutrophils Absolute: 2.3 10*3/uL (ref 1.4–7.0)
Neutrophils: 57 %
Platelets: 214 10*3/uL (ref 150–450)
RBC: 5.01 x10E6/uL (ref 4.14–5.80)
RDW: 12.1 % (ref 11.6–15.4)
WBC: 4.1 10*3/uL (ref 3.4–10.8)

## 2019-05-02 LAB — ALLERGEN PROFILE, MOLD
Aureobasidi Pullulans IgE: <0.1 kU/L
Candida Albicans IgE: <0.1 kU/L
M009-IgE Fusarium proliferatum: <0.1 kU/L
M014-IgE Epicoccum purpur: <0.1 kU/L
Phoma Betae IgE: <0.1 kU/L
Setomelanomma Rostrat: <0.1 kU/L

## 2019-05-02 LAB — ASPERGILLUS PRECIPITINS
A.Fumigatus #1 Abs: NEGATIVE
Aspergillus Flavus Antibodies: NEGATIVE
Aspergillus Niger Antibodies: NEGATIVE
Aspergillus glaucus IgG: NEGATIVE
Aspergillus nidulans IgG: NEGATIVE
Aspergillus terreus IgG: NEGATIVE

## 2019-05-13 ENCOUNTER — Ambulatory Visit (INDEPENDENT_AMBULATORY_CARE_PROVIDER_SITE_OTHER): Payer: Medicare Other | Admitting: Allergy & Immunology

## 2019-05-13 ENCOUNTER — Encounter: Payer: Self-pay | Admitting: Allergy & Immunology

## 2019-05-13 ENCOUNTER — Other Ambulatory Visit: Payer: Self-pay

## 2019-05-13 VITALS — BP 100/60 | HR 91 | Temp 97.5°F | Resp 18 | Ht 70.5 in | Wt 189.0 lb

## 2019-05-13 DIAGNOSIS — J4550 Severe persistent asthma, uncomplicated: Secondary | ICD-10-CM | POA: Diagnosis not present

## 2019-05-13 DIAGNOSIS — J302 Other seasonal allergic rhinitis: Secondary | ICD-10-CM | POA: Diagnosis not present

## 2019-05-13 DIAGNOSIS — J3089 Other allergic rhinitis: Secondary | ICD-10-CM | POA: Diagnosis not present

## 2019-05-13 NOTE — Patient Instructions (Addendum)
1. Severe persistent asthma, uncomplicated - Lung testing was much better today. - We can always add on Xolair if your asthma control become worse.  - We are going to continue with Trelegy one puff once daily.  - Daily controller medication(s): Trelegy 200/62.5/25 one puff once daily - Prior to physical activity: albuterol 2 puffs 10-15 minutes before physical activity. - Rescue medications: albuterol 4 puffs every 4-6 hours as needed - Asthma control goals:  * Full participation in all desired activities (may need albuterol before activity) * Albuterol use two time or less a week on average (not counting use with activity) * Cough interfering with sleep two time or less a month * Oral steroids no more than once a year * No hospitalizations  2. Chronic rhinitis (dust mite, cat, dog, grass, trees, and ragweed) - Continue taking: Xyzal (levocetirizine) 5mg  tablet once daily - You can use an extra dose of the antihistamine, if needed, for breakthrough symptoms.  - Consider nasal saline rinses 1-2 times daily to remove allergens from the nasal cavities as well as help with mucous clearance (this is especially helpful to do before the nasal sprays are given) - Avoidance measures provided.   3. Return in about 6 months (around 11/11/2019). This can be an in-person, a virtual Webex or a telephone follow up visit.   Please inform us of any Emergency Department visits, hospitalizations, or changes in symptoms. Call us before going to the ED for breathing or allergy symptoms since we might be able to fit you in for a sick visit. Feel free to contact us anytime with any questions, problems, or concerns.  It was a pleasure to see you again today!  Websites that have reliable patient information: 1. American Academy of Asthma, Allergy, and Immunology: www.aaaai.org 2. Food Allergy Research and Education (FARE): foodallergy.org 3. Mothers of Asthmatics: http://www.asthmacommunitynetwork.org 4.  American College of Allergy, Asthma, and Immunology: www.acaai.org  "Like" [place] on Facebook and Instagram for our latest updates!        Make sure you are registered to vote! If you have moved or changed any of your contact information, you will need to get this updated before voting!  In some cases, you MAY be able to register to vote online: [email or web address]

## 2019-05-13 NOTE — Progress Notes (Signed)
FOLLOW UP  Date of Service/Encounter:  05/13/19   Assessment:   Severe persistent asthma, uncomplicated  Perennial and seasonal allergic rhinitis (dust mite, cat, dog, grass, trees, and ragweed)  Plan/Recommendations:    1. Severe persistent asthma, uncomplicated - Lung testing was much better today. - We can always add on Xolair if your asthma control become worse.  - We are going to continue with Trelegy one puff once daily.  - Daily controller medication(s): Trelegy 200/62.5/25 one puff once daily - Prior to physical activity: albuterol 2 puffs 10-15 minutes before physical activity. - Rescue medications: albuterol 4 puffs every 4-6 hours as needed - Asthma control goals:  * Full participation in all desired activities (may need albuterol before activity) * Albuterol use two time or less a week on average (not counting use with activity) * Cough interfering with sleep two time or less a month * Oral steroids no more than once a year * No hospitalizations  2. Chronic rhinitis (dust mite, cat, dog, grass, trees, and ragweed) - Continue taking: Xyzal (levocetirizine) 5mg  tablet once daily - You can use an extra dose of the antihistamine, if needed, for breakthrough symptoms.  - Consider nasal saline rinses 1-2 times daily to remove allergens from the nasal cavities as well as help with mucous clearance (this is especially helpful to do before the nasal sprays are given) - Avoidance measures provided.   3. Return in about 6 months (around 11/11/2019). This can be an in-person, a virtual Webex or a telephone follow up visit.  Subjective:   Mason [name] is a 73 y.o. male presenting today for follow up of  Chief Complaint  Patient presents with  . Asthma    Mason [name] has a history of the following: Patient Active Problem List   Diagnosis Date Noted  . GI bleed 04/14/2014  . Anemia 04/14/2014  . Malnutrition of moderate degree ([place]) 04/09/2014  . Sepsis ([place])  04/08/2014  . Abnormal abdominal CT scan 04/07/2014  . Right otitis media 04/02/2014  . Fever 04/01/2014  . Necrotizing pancreatitis 03/26/2014  . Cholelithiasis 03/26/2014  . History of alcohol use, denies any recent alcohol use 03/26/2014  . Pancreatitis, gallstone   . SOB (shortness of breath) 07/17/2013  . Intermittent fever 07/17/2013  . Seasonal asthma 07/17/2013  . Fever, unspecified 07/11/2013  . Constipation 07/11/2013  . Cardiomegaly 07/10/2013  . Abnormal ultrasound of abdomen 07/09/2013  . Abdominal pain, acute, periumbilical 123XX123  . Nausea & vomiting 07/08/2013  . Overweight (BMI 25.0-29.9) 07/08/2013    History obtained from: chart review and patient.  Mason [name] is a 73 y.o. male presenting for a follow up visit.  He was last seen as a new patient in December 2020.  At that time, his lung testing was in the 20 to 30% range.  We did give him a nebulizer treatment with improvement.  He was wheezing throughout.  We started him on a prolonged prednisone taper and changed him from [facility] to Trelegy 2 and a micrograms 1 puff once daily.  We did obtain some lab work to look for serious causes of asthma.  We also obtain an environmental allergy panel that was positive to several indoor and outdoor allergens.  Aspergillus precipitins were negative and alpha 1 antitrypsin was normal.  Since last visit, he has done very well.  He reports feeling much better while on the Trelegy.  Asthma/Respiratory Symptom History: He remains on the Trelegy 1 puff once daily.  He does need  a prescription, as we only gave him samples of the last visit.  He is sleeping well through the night without nighttime awakening.  He has not required any emergency room visits since the last time we saw him.  He has not been using his rescue inhaler much at all.  Allergic Rhinitis Symptom History: He is on the Xyzal 1 tablet once daily.  He does not use a nose spray.  He has not required any antibiotics.   Regarding his sensitizations, he no longer has a pet at all.  He does not report any postnasal drip or congestion.  Otherwise, there have been no changes to his past medical history, surgical history, family history, or social history.    Review of Systems  Constitutional: Negative.  Negative for chills, fever, malaise/fatigue and weight loss.  HENT: Negative.  Negative for congestion, ear discharge and ear pain.   Eyes: Negative for pain, discharge and redness.  Respiratory: Negative for cough, sputum production, shortness of breath and wheezing.   Cardiovascular: Negative.  Negative for chest pain and palpitations.  Gastrointestinal: Negative for abdominal pain, constipation, diarrhea, heartburn, nausea and vomiting.  Skin: Negative.  Negative for itching and rash.  Neurological: Negative for dizziness and headaches.  Endo/Heme/Allergies: Negative for environmental allergies. Does not bruise/bleed easily.       Objective:   Blood pressure 100/60, pulse 91, temperature (!) 97.5 F (36.4 C), temperature source Temporal, resp. rate 18, height 5' 10.5" (1.791 m), weight 189 lb (85.7 kg), SpO2 96 %. Body mass index is 26.74 kg/m.   Physical Exam:  Physical Exam  Constitutional: He appears well-developed.  Making full sentences.  Very talkative.  HENT:  Head: Normocephalic and atraumatic.  Right Ear: Tympanic membrane, external ear and ear canal normal.  Left Ear: Tympanic membrane and ear canal normal.  Nose: No mucosal edema, rhinorrhea, nasal deformity or septal deviation. No epistaxis. Right sinus exhibits no maxillary sinus tenderness and no frontal sinus tenderness. Left sinus exhibits no maxillary sinus tenderness and no frontal sinus tenderness.  Mouth/Throat: Uvula is midline and oropharynx is clear and moist. Mucous membranes are not pale and not dry.  Eyes: Pupils are equal, round, and reactive to light. Conjunctivae and EOM are normal. Right eye exhibits no chemosis and  no discharge. Left eye exhibits no chemosis and no discharge. Right conjunctiva is not injected. Left conjunctiva is not injected.  Cardiovascular: Normal rate, regular rhythm and normal heart sounds.  Respiratory: Effort normal and breath sounds normal. No accessory muscle usage. No tachypnea. No respiratory distress. He has no wheezes. He has no rhonchi. He has no rales. He exhibits no tenderness.  Moving air well in all lung fields.  No increased work of breathing.  No crackles or wheezes.  Lymphadenopathy:    He has no cervical adenopathy.  Neurological: He is alert.  Skin: No abrasion, no petechiae and no rash noted. Rash is not papular, not vesicular and not urticarial. No erythema. No pallor.  Psychiatric: He has a normal mood and affect.     Diagnostic studies:    Spirometry: results normal (FEV1: 2.53/76%, FVC: 3.66/87%, FEV1/FVC: 69%).    Spirometry consistent with normal pattern.   Allergy Studies: none        Salvatore Marvel, MD  [facility]

## 2019-09-29 ENCOUNTER — Encounter: Payer: Self-pay | Admitting: Allergy & Immunology

## 2019-11-11 ENCOUNTER — Ambulatory Visit: Payer: Medicare Other | Admitting: Allergy & Immunology

## 2019-11-18 ENCOUNTER — Ambulatory Visit (INDEPENDENT_AMBULATORY_CARE_PROVIDER_SITE_OTHER): Payer: Medicare Other | Admitting: Allergy & Immunology

## 2019-11-18 ENCOUNTER — Encounter: Payer: Self-pay | Admitting: Allergy & Immunology

## 2019-11-18 ENCOUNTER — Other Ambulatory Visit: Payer: Self-pay

## 2019-11-18 VITALS — BP 102/64 | HR 92 | Temp 97.3°F | Resp 18 | Ht 70.0 in | Wt 184.6 lb

## 2019-11-18 DIAGNOSIS — J3089 Other allergic rhinitis: Secondary | ICD-10-CM

## 2019-11-18 DIAGNOSIS — J310 Chronic rhinitis: Secondary | ICD-10-CM | POA: Diagnosis not present

## 2019-11-18 DIAGNOSIS — J4550 Severe persistent asthma, uncomplicated: Secondary | ICD-10-CM

## 2019-11-18 DIAGNOSIS — J302 Other seasonal allergic rhinitis: Secondary | ICD-10-CM | POA: Diagnosis not present

## 2019-11-18 MED ORDER — TRELEGY ELLIPTA 200-62.5-25 MCG/INH IN AEPB: 1.0000 | INHALATION_SPRAY | Freq: Every day | RESPIRATORY_TRACT | 5 refills | Status: DC

## 2019-11-18 NOTE — Progress Notes (Signed)
FOLLOW UP  Date of Service/Encounter:  11/18/19   Assessment:   Severe persistent asthma, uncomplicated  Perennial and seasonal allergic rhinitis (dust mite, cat, dog, grass, trees, and ragweed)   Mr. Mason [name] has a spirometry that is just as bad as when I first met him unfortunately.  He has not been using his medications at all.  While he is open to starting Xolair for control of his asthma, I did emphasize that he needs to be compliant with his regular medications before Xolair was ever approved.  Plan/Recommendations:   1. Severe persistent asthma, uncomplicated - Lung testing was back to her was when I first met you. - However, did get better with the albuterol treatment. - Start the prednisone pack provided. - Restart Trelegy 1 puff once daily. - Information on Xolair add-on therapy provided. - However, this will be approved if you are not compliant with your regular medicine. - Therefore we will follow-up in 3 months to see how you are doing. - Daily controller medication(s): Trelegy 200/62.5/25 one puff once daily - Prior to physical activity: albuterol 2 puffs 10-15 minutes before physical activity. - Rescue medications: albuterol 4 puffs every 4-6 hours as needed - Asthma control goals:  * Full participation in all desired activities (may need albuterol before activity) * Albuterol use two time or less a week on average (not counting use with activity) * Cough interfering with sleep two time or less a month * Oral steroids no more than once a year * No hospitalizations  2. Chronic rhinitis (dust mite, cat, dog, grass, trees, and ragweed) - Continue taking: Xyzal (levocetirizine) 43m tablet once daily - You can use an extra dose of the antihistamine, if needed, for breakthrough symptoms.  - Consider nasal saline rinses 1-2 times daily to remove allergens from the nasal cavities as well as help with mucous clearance (this is especially helpful to do before the nasal  sprays are given)  3. Return in about 3 months (around 02/18/2020). This can be an in-person, a virtual Webex or a telephone follow up visit.  Subjective:   Mason [name] a 74y.o. male presenting today for follow up of  Chief Complaint  Patient presents with  . Follow-up  . Asthma    Mason [name] a history of the following: Patient Active Problem List   Diagnosis Date Noted  . GI bleed 04/14/2014  . Anemia 04/14/2014  . Malnutrition of moderate degree ([facility] 04/09/2014  . Sepsis ([facility] 04/08/2014  . Abnormal abdominal CT scan 04/07/2014  . Right otitis media 04/02/2014  . Fever 04/01/2014  . Necrotizing pancreatitis 03/26/2014  . Cholelithiasis 03/26/2014  . History of alcohol use, denies any recent alcohol use 03/26/2014  . Pancreatitis, gallstone   . SOB (shortness of breath) 07/17/2013  . Intermittent fever 07/17/2013  . Seasonal asthma 07/17/2013  . Fever, unspecified 07/11/2013  . Constipation 07/11/2013  . Cardiomegaly 07/10/2013  . Abnormal ultrasound of abdomen 07/09/2013  . Abdominal pain, acute, periumbilical 027/74/1287 . Nausea & vomiting 07/08/2013  . Overweight (BMI 25.0-29.9) 07/08/2013    History obtained from: chart review and the patient.  Mason [name] a 75y.o. male presenting for a follow up visit.  He was last seen in December 2020.  At that time, his lung testing looked great.  We continue with Trelegy 1 puff once daily.  For his rhinitis, we continue with Xyzal 1 tablet daily as well as nasal saline rinses as needed.  Since last  visit, he reports that he is doing well.  However, upon further questioning, it seems that he is not doing so well.  Asthma/Respiratory Symptom History: AdhesiveHe has both Trelegy and Wixela at home oscillates between the 2 of them.   However, he then endorses quite a bit of missed doses.  When asked, he estimates that he uses his inhaler twice per week at the most.  He has not needed prednisone at all since last visit.  He  has been using his rescue inhaler more than usual.  He has not been hospitalized.  ACT score is 9, indicating poor asthma control.  He is open to talking about Xolair today.  He does not mind injections.  Allergic Rhinitis Symptom History: He is not using any of his antihistamines on a routine basis.  He is not using any nasal saline rinses.  He has not needed prednisone or antibiotics for any sinus infections.  He had environmental allergy panel performed in December 2020 that showed sensitizations to dust mites, cat, dog, grasses, trees, and ragweed. Total IgE was 133.  He has been doing a lot of traveling.  He tells me that he will stay in 1 place for 1 month at a time.  He is retired and apparently flies quite a bit.  At 1 point, he forgot where his car was (it ended up being parked in the [place]).  He instead rented a car and drove the rental car around for several weeks before he remembered where his car was.  He then went to pick up his car and he had quite the parking bill.  Unfortunately, some of his inhalers were in this part car as well, so he did not take his medications at all during that time.  Otherwise, there have been no changes to his past medical history, surgical history, family history, or social history.    Review of Systems  Constitutional: Negative.  Negative for fever, malaise/fatigue and weight loss.  HENT: Negative.  Negative for congestion, ear discharge and ear pain.   Eyes: Negative for pain, discharge and redness.  Respiratory: Positive for cough and shortness of breath. Negative for sputum production and wheezing.   Cardiovascular: Negative.  Negative for chest pain and palpitations.  Gastrointestinal: Negative for abdominal pain and heartburn.  Skin: Negative.  Negative for itching and rash.  Neurological: Negative for dizziness and headaches.  Endo/Heme/Allergies: Negative for environmental allergies. Does not bruise/bleed easily.       Objective:     Blood pressure 102/64, pulse 92, temperature (!) 97.3 F (36.3 C), temperature source Temporal, resp. rate 18, height '5\' 10"'  (1.778 m), weight 184 lb 9.6 oz (83.7 kg), SpO2 93 %. Body mass index is 26.49 kg/m.   Physical Exam:  Physical Exam Constitutional:      Appearance: He is well-developed.     Comments: Somewhat forgetful.  Pleasant.  HENT:     Head: Normocephalic and atraumatic.     Right Ear: Tympanic membrane, ear canal and external ear normal.     Left Ear: Tympanic membrane, ear canal and external ear normal.     Nose: No nasal deformity, septal deviation, mucosal edema or rhinorrhea.     Right Turbinates: Enlarged and swollen.     Left Turbinates: Enlarged and swollen.     Right Sinus: No maxillary sinus tenderness or frontal sinus tenderness.     Left Sinus: No maxillary sinus tenderness or frontal sinus tenderness.     Comments: Mild cobblestoning  present in the posterior oropharynx.    Mouth/Throat:     Mouth: Mucous membranes are not pale and not dry.     Pharynx: Uvula midline.  Eyes:     General:        Right eye: No discharge.        Left eye: No discharge.     Conjunctiva/sclera: Conjunctivae normal.     Right eye: Right conjunctiva is not injected. No chemosis.    Left eye: Left conjunctiva is not injected. No chemosis.    Pupils: Pupils are equal, round, and reactive to light.  Cardiovascular:     Rate and Rhythm: Normal rate and regular rhythm.     Heart sounds: Normal heart sounds.  Pulmonary:     Effort: Pulmonary effort is normal. No tachypnea, accessory muscle usage or respiratory distress.     Breath sounds: Normal breath sounds. No wheezing, rhonchi or rales.     Comments: Moving air well in all lung fields.  No increased work of breathing. Chest:     Chest wall: No tenderness.  Lymphadenopathy:     Cervical: No cervical adenopathy.  Skin:    Coloration: Skin is not pale.     Findings: No abrasion, erythema, petechiae or rash. Rash is not  papular, urticarial or vesicular.     Comments: No eczematous or urticarial lesions noted.  Neurological:     Mental Status: He is alert.      Diagnostic studies:    Spirometry: results abnormal (FEV1: 1.29/45%, FVC: 1.66/44%, FEV1/FVC: 78%).    Spirometry consistent with possible restrictive disease. Albuterol four puffs via MDI treatment given in clinic with significant improvement in FVC per ATS criteria.  Allergy Studies: none           Mason Marvel, MD  [facility]

## 2019-11-18 NOTE — Patient Instructions (Addendum)
1. Severe persistent asthma, uncomplicated - Lung testing was back to her was when I first met you. - However, did get better with the albuterol treatment. - Start the prednisone pack provided. - Restart Trelegy 1 puff once daily. - Information on Xolair add-on therapy provided. - However, this will be approved if you are not compliant with your regular medicine. - Therefore we will follow-up in 3 months to see how you are doing. - Daily controller medication(s): Trelegy 200/62.5/25 one puff once daily - Prior to physical activity: albuterol 2 puffs 10-15 minutes before physical activity. - Rescue medications: albuterol 4 puffs every 4-6 hours as needed - Asthma control goals:  * Full participation in all desired activities (may need albuterol before activity) * Albuterol use two time or less a week on average (not counting use with activity) * Cough interfering with sleep two time or less a month * Oral steroids no more than once a year * No hospitalizations  2. Chronic rhinitis (dust mite, cat, dog, grass, trees, and ragweed) - Continue taking: Xyzal (levocetirizine) 60m tablet once daily - You can use an extra dose of the antihistamine, if needed, for breakthrough symptoms.  - Consider nasal saline rinses 1-2 times daily to remove allergens from the nasal cavities as well as help with mucous clearance (this is especially helpful to do before the nasal sprays are given)  3. Return in about 3 months (around 02/18/2020). This can be an in-person, a virtual Webex or a telephone follow up visit.   Please inform [place] any Emergency Department visits, hospitalizations, or changes in symptoms. Call [place] going to the ED for breathing or allergy symptoms since we might be able to fit you in for a sick visit. Feel free to contact u[place]anytime with any questions, problems, or concerns.  It was a pleasure to see you again today!  Websites that have reliable patient information: 1. American  Academy of Asthma, Allergy, and Immunology: www.aaaai.org 2. Food Allergy Research and Education (FARE): foodallergy.org 3. Mothers of Asthmatics: http://www.asthmacommunitynetwork.org 4. American College of Allergy, Asthma, and Immunology: www.acaai.org   COVID-19 Vaccine Information can be found at: [email or web address] questions related to vaccine distribution or appointments, please email [email or web address] or call [phone]     "Like" [facility] Facebook and Instagram for our latest updates!        Make sure you are registered to vote! If you have moved or changed any of your contact information, you will need to get this updated before voting!  In some cases, you MAY be able to register to vote online: [email or web address]

## 2019-11-19 ENCOUNTER — Encounter: Payer: Self-pay | Admitting: Allergy & Immunology

## 2020-02-19 ENCOUNTER — Ambulatory Visit: Payer: Medicare Other | Admitting: Allergy & Immunology

## 2020-02-26 ENCOUNTER — Other Ambulatory Visit: Payer: Self-pay

## 2020-02-26 ENCOUNTER — Ambulatory Visit: Payer: Medicare Other | Admitting: Allergy & Immunology

## 2020-02-26 ENCOUNTER — Encounter: Payer: Self-pay | Admitting: Allergy & Immunology

## 2020-02-26 VITALS — BP 120/80 | HR 87 | Temp 97.5°F | Resp 16

## 2020-02-26 DIAGNOSIS — J302 Other seasonal allergic rhinitis: Secondary | ICD-10-CM

## 2020-02-26 DIAGNOSIS — J3089 Other allergic rhinitis: Secondary | ICD-10-CM | POA: Diagnosis not present

## 2020-02-26 DIAGNOSIS — J4550 Severe persistent asthma, uncomplicated: Secondary | ICD-10-CM | POA: Diagnosis not present

## 2020-02-26 NOTE — Progress Notes (Signed)
FOLLOW UP  Date of Service/Encounter:  02/26/20   Assessment:   Severe persistent asthma, uncomplicated  Perennial and seasonal allergic rhinitis (dust mite, cat, dog, grass, trees, and ragweed)  No smoking history   Fully vaccinated against COVID-19 (needs booster in December 2021)   Mason [name] presents for follow-up visit. As with previous visits, he is confused about his inhalers. He seems to be getting an Advair inhaler from somebody with, possibly his PCP, although it might just be an expired inhaler. He is using this twice a day. He reports using his Trelegy once a day, although I am skeptical. He definitely needs to be on Xolair. He is very open to this. We had him sign consent today and we will have Tammy reach out to discuss this further. I am at a loss as to why his breathing is so bad. He has no history of smoking. 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 Therefore, he might have occupational asthma with interstitial lung disease. We could consider getting a chest CT at the next visit.  Plan/Recommendations:   1. Severe persistent asthma, uncomplicated - Lung testing looks terrible again.  - We need to get Xolair on board to help.  - Tammy will be reaching out to you to discuss the approval process.  - Consent signed today.  - STOP the Advair/Wixella.  - Daily controller medication(s): Trelegy 200/62.5/25 one puff once daily  - Prior to physical activity: albuterol 2 puffs 10-15 minutes before physical activity. - Rescue medications: albuterol 4 puffs every 4-6 hours as needed - Asthma control goals:  * Full participation in all desired activities (may need albuterol before activity) * Albuterol use two time or less a week on average (not counting use with activity) * Cough interfering with sleep two time or less a month * Oral steroids no more than once a year * No hospitalizations  2. Chronic rhinitis (dust  mite, cat, dog, grass, trees, and ragweed) - Continue taking: Xyzal (levocetirizine) 5mg  tablet once daily - You can use an extra dose of the antihistamine, if needed, for breakthrough symptoms.  - Consider nasal saline rinses 1-2 times daily to remove allergens from the nasal cavities as well as help with mucous clearance (this is especially helpful to do before the nasal sprays are given)  3. Return in about 6 weeks (around 04/08/2020). This can be an in-person, a virtual Webex or a telephone follow up visit.  Subjective:   Mason [name] is a 74 y.o. male presenting today for follow up of  Chief Complaint  Patient presents with  . Follow-up  . Asthma    Mason [name] has a history of the following: Patient Active Problem List   Diagnosis Date Noted  . GI bleed 04/14/2014  . Anemia 04/14/2014  . Malnutrition of moderate degree ([place]) 04/09/2014  . Sepsis ([place]) 04/08/2014  . Abnormal abdominal CT scan 04/07/2014  . Right otitis media 04/02/2014  . Fever 04/01/2014  . Necrotizing pancreatitis 03/26/2014  . Cholelithiasis 03/26/2014  . History of alcohol use, denies any recent alcohol use 03/26/2014  . Pancreatitis, gallstone   . SOB (shortness of breath) 07/17/2013  . Intermittent fever 07/17/2013  . Seasonal asthma 07/17/2013  . Fever, unspecified 07/11/2013  . Constipation 07/11/2013  . Cardiomegaly 07/10/2013  . Abnormal ultrasound of abdomen 07/09/2013  . Abdominal pain, acute, periumbilical 38/46/6599  . Nausea & vomiting 07/08/2013  . Overweight (BMI  25.0-29.9) 07/08/2013    History obtained from: chart review and patient.  Mason [name] is a 74 y.o. male presenting for a follow up visit. He was last seen in June 2021. At that time, his lung testing looked terrible. He did improve following an albuterol treatment. We started him on a prednisone Dosepa and recommended restarting his Trelegy 1 puff once daily. We did give him information on Xolair for add-on therapy. For his  rhinitis, would continue with Xyzal. We also recommended nasal saline rinses.  Since last visit, he reports doing well. However, his ACT score is only 10 today.  Asthma/Respiratory Symptom History: He remains on his Trelegy, although he tells me he is taking it twice a day. Later on during the visit, it seems that he is talking about his Advair. We have had this confusion before when he is taking both his Trelegy and his Advair. I have told him repeatedly to stop the Advair. He is using ProAir although it is difficult to get an idea of how often this is happening. He has not been to the emergency room for any of his breathing issues. He has not received prednisone. He seems confused when I asked him about the Xolair today, although we discussed at the last visit.  Allergic Rhinitis Symptom History: He remains on the levocetirizine 5 mg once daily.  Otherwise, there have been no changes to his past medical history, surgical history, family history, or social history.    Review of Systems  Constitutional: Negative.  Negative for fever, malaise/fatigue and weight loss.  HENT: Negative.  Negative for congestion, ear discharge, ear pain and sore throat.   Eyes: Negative for pain, discharge and redness.  Respiratory: Positive for cough and wheezing. Negative for sputum production and shortness of breath.   Cardiovascular: Negative.  Negative for chest pain and palpitations.  Gastrointestinal: Negative for abdominal pain, heartburn, nausea and vomiting.  Skin: Negative.  Negative for itching and rash.  Neurological: Negative for dizziness and headaches.  Endo/Heme/Allergies: Negative for environmental allergies. Does not bruise/bleed easily.       Objective:   Blood pressure 120/80, pulse 87, temperature (!) 97.5 F (36.4 C), temperature source Temporal, resp. rate 16, SpO2 94 %. There is no height or weight on file to calculate BMI.   Physical Exam:  Physical Exam Constitutional:       Appearance: He is well-developed.     Comments: Smiling and comfortable appearing.  HENT:     Head: Normocephalic and atraumatic.     Right Ear: Tympanic membrane, ear canal and external ear normal.     Left Ear: Tympanic membrane, ear canal and external ear normal.     Nose: No nasal deformity, septal deviation, mucosal edema or rhinorrhea.     Right Turbinates: Enlarged and swollen.     Left Turbinates: Enlarged and swollen.     Right Sinus: No maxillary sinus tenderness or frontal sinus tenderness.     Left Sinus: No maxillary sinus tenderness or frontal sinus tenderness.     Mouth/Throat:     Mouth: Mucous membranes are not pale and not dry.     Pharynx: Uvula midline.  Eyes:     General:        Right eye: No discharge.        Left eye: No discharge.     Conjunctiva/sclera: Conjunctivae normal.     Right eye: Right conjunctiva is not injected. No chemosis.    Left eye: Left conjunctiva is not  injected. No chemosis.    Pupils: Pupils are equal, round, and reactive to light.  Cardiovascular:     Rate and Rhythm: Normal rate and regular rhythm.     Heart sounds: Normal heart sounds.  Pulmonary:     Effort: Accessory muscle usage present. No tachypnea or respiratory distress.     Breath sounds: Examination of the right-upper field reveals wheezing and rhonchi. Examination of the left-upper field reveals wheezing and rhonchi. Examination of the right-middle field reveals wheezing. Examination of the left-middle field reveals wheezing. Examination of the right-lower field reveals wheezing. Examination of the left-lower field reveals wheezing. Wheezing and rhonchi present. No rales.  Chest:     Chest wall: No tenderness.  Lymphadenopathy:     Cervical: No cervical adenopathy.  Skin:    Coloration: Skin is not pale.     Findings: No abrasion, erythema, petechiae or rash. Rash is not papular, urticarial or vesicular.  Neurological:     Mental Status: He is alert.      Diagnostic  studies:    Spirometry: results abnormal (FEV1: 1.00/32%, FVC: 1.76/41%, FEV1/FVC: 57%).    Spirometry consistent with mixed obstructive and restrictive disease.   Allergy Studies: none        Salvatore Marvel, MD  [facility]

## 2020-02-26 NOTE — Patient Instructions (Addendum)
1. Severe persistent asthma, uncomplicated - Lung testing looks terrible again.  - We need to get Xolair on board to help.  - Tammy will be reaching out to you to discuss the approval process.  - Consent signed today.  - STOP the Advair/Wixella.  - Daily controller medication(s): Trelegy 200/62.5/25 one puff once daily   - Prior to physical activity: albuterol 2 puffs 10-15 minutes before physical activity. - Rescue medications: albuterol 4 puffs every 4-6 hours as needed - Asthma control goals:  * Full participation in all desired activities (may need albuterol before activity) * Albuterol use two time or less a week on average (not counting use with activity) * Cough interfering with sleep two time or less a month * Oral steroids no more than once a year * No hospitalizations  2. Chronic rhinitis (dust mite, cat, dog, grass, trees, and ragweed) - Continue taking: Xyzal (levocetirizine) 5mg  tablet once daily - You can use an extra dose of the antihistamine, if needed, for breakthrough symptoms.  - Consider nasal saline rinses 1-2 times daily to remove allergens from the nasal cavities as well as help with mucous clearance (this is especially helpful to do before the nasal sprays are given)  3. Return in about 6 weeks (around 04/08/2020). This can be an in-person, a virtual Webex or a telephone follow up visit.   Please inform us of any Emergency Department visits, hospitalizations, or changes in symptoms. Call us before going to the ED for breathing or allergy symptoms since we might be able to fit you in for a sick visit. Feel free to contact us anytime with any questions, problems, or concerns.  It was a pleasure to see you again today!  Websites that have reliable patient information: 1. American Academy of Asthma, Allergy, and Immunology: www.aaaai.org 2. Food Allergy Research and Education (FARE): foodallergy.org 3. Mothers of Asthmatics:  http://www.asthmacommunitynetwork.org 4. American College of Allergy, Asthma, and Immunology: www.acaai.org   COVID-19 Vaccine Information can be found at: [email or web address] For questions related to vaccine distribution or appointments, please email [email or web address] or call [phone].     "Like" [place] on Facebook and Instagram for our latest updates!        Make sure you are registered to vote! If you have moved or changed any of your contact information, you will need to get this updated before voting!  In some cases, you MAY be able to register to vote online: [email or web address]

## 2020-04-20 ENCOUNTER — Encounter: Payer: Self-pay | Admitting: Allergy & Immunology

## 2020-04-20 ENCOUNTER — Ambulatory Visit: Payer: Medicare Other | Admitting: Allergy & Immunology

## 2020-04-20 ENCOUNTER — Other Ambulatory Visit: Payer: Self-pay

## 2020-04-20 ENCOUNTER — Ambulatory Visit: Payer: Medicare Other

## 2020-04-20 VITALS — BP 92/70 | HR 100 | Temp 97.6°F | Resp 14 | Ht 70.75 in | Wt 181.0 lb

## 2020-04-20 DIAGNOSIS — J3089 Other allergic rhinitis: Secondary | ICD-10-CM | POA: Diagnosis not present

## 2020-04-20 DIAGNOSIS — J302 Other seasonal allergic rhinitis: Secondary | ICD-10-CM

## 2020-04-20 DIAGNOSIS — Z23 Encounter for immunization: Secondary | ICD-10-CM

## 2020-04-20 DIAGNOSIS — J4550 Severe persistent asthma, uncomplicated: Secondary | ICD-10-CM | POA: Diagnosis not present

## 2020-04-20 MED ORDER — TRELEGY ELLIPTA 200-62.5-25 MCG/INH IN AEPB: 1.0000 | INHALATION_SPRAY | Freq: Every day | RESPIRATORY_TRACT | 5 refills | Status: DC

## 2020-04-20 MED ORDER — ALBUTEROL SULFATE HFA 108 (90 BASE) MCG/ACT IN AERS: 1.0000 | INHALATION_SPRAY | Freq: Four times a day (QID) | RESPIRATORY_TRACT | 5 refills | Status: DC | PRN

## 2020-04-20 MED ORDER — FLUTICASONE PROPIONATE HFA 220 MCG/ACT IN AERO: 1.0000 | INHALATION_SPRAY | Freq: Two times a day (BID) | RESPIRATORY_TRACT | 5 refills | Status: DC

## 2020-04-20 NOTE — Progress Notes (Signed)
FOLLOW UP  Date of Service/Encounter:  04/20/20   Assessment:   Severe persistent asthma, uncomplicated  Perennial and seasonal allergicrhinitis(dust mite, cat, dog, grass, trees, and ragweed)  No smoking history   Fully vaccinated against COVID-19 (received booster today)  ? dementia (patient came back into the office requesting a COVID-19 vaccine, which we already given him)  Plan/Recommendations:   1. Severe persistent asthma, uncomplicated - Lung testing looks terrible again and you sounded bad when you got here. - We did give you 8 puffs of Xopenex and this helped improve your airflow a lot.  - We need to get Xolair on board to help with your breathing. - Fill out the paperwork provided today and send back at your earliest convenience so that we can get the approval process started.  - Consent for Xolair signed.  - Spacer sample and demonstration provided (to use with the albuterol rescue medication) - Daily controller medication(s): Trelegy 200/62.5/25 one puff once daily  - Prior to physical activity: albuterol 2 puffs 10-15 minutes before physical activity. - Rescue medications: albuterol 4 puffs every 4-6 hours as needed - Asthma control goals:  * Full participation in all desired activities (may need albuterol before activity) * Albuterol use two time or less a week on average (not counting use with activity) * Cough interfering with sleep two time or less a month * Oral steroids no more than once a year * No hospitalizations  2. Chronic rhinitis (dust mite, cat, dog, grass, trees, and ragweed) - Continue taking: Xyzal (levocetirizine) 5mg  tablet once daily - You can use an extra dose of the antihistamine, if needed, for breakthrough symptoms.  - Consider nasal saline rinses 1-2 times daily to remove allergens from the nasal cavities as well as help with mucous clearance (this is especially helpful to do before the nasal sprays are given)  3. Return in  about 2 months (around 06/20/2020).   Subjective:   Mason [name] is a 74 y.o. male presenting today for follow up of  Chief Complaint  Patient presents with  . Asthma    Using more medication. Walking makes breathing worse.    Mason [name] has a history of the following: Patient Active Problem List   Diagnosis Date Noted  . GI bleed 04/14/2014  . Anemia 04/14/2014  . Malnutrition of moderate degree ([place]) 04/09/2014  . Sepsis ([place]) 04/08/2014  . Abnormal abdominal CT scan 04/07/2014  . Right otitis media 04/02/2014  . Fever 04/01/2014  . Necrotizing pancreatitis 03/26/2014  . Cholelithiasis 03/26/2014  . History of alcohol use, denies any recent alcohol use 03/26/2014  . Pancreatitis, gallstone   . SOB (shortness of breath) 07/17/2013  . Intermittent fever 07/17/2013  . Seasonal asthma 07/17/2013  . Fever, unspecified 07/11/2013  . Constipation 07/11/2013  . Cardiomegaly 07/10/2013  . Abnormal ultrasound of abdomen 07/09/2013  . Abdominal pain, acute, periumbilical 83/15/1761  . Nausea & vomiting 07/08/2013  . Overweight (BMI 25.0-29.9) 07/08/2013    History obtained from: chart review and patient.  Mason [name] is a 74 y.o. male presenting for a follow up visit.  He was last seen in October 2021.  At that time, his lung testing looked terrible again.  We recommended stopping the Advair and starting Trelegy 200 mcg 1 puff once daily.  He is also on albuterol as needed.  For his rhinitis, we continued with Xyzal as well as nasal saline rinses as needed.  He has sensitizations to dust mite, cat, dog, grass, trees,  and ragweed.  We did discuss starting Xolair.  In the interim, he has continued to have issues.   Asthma/Respiratory Symptom History: Since the last visit, he has bene using the Trelegy once daily and albuterol up to three times daily. He does have some [place] at home which he has been using. We did discuss using Xolair at the next visit. He has not been to the hospital at  all since the last visit. He has needed prednisone.   Allergic Rhinitis Symptom History: He has not needed antibiotics at all. He has been doing fairly well. He has not bee using nose sprays consistently at all, only as needed.    He does need a booster today. He is open to receiving it.   Otherwise, there have been no changes to his past medical history, surgical history, family history, or social history.    Review of Systems  Constitutional: Negative.  Negative for chills, fever, malaise/fatigue and weight loss.  HENT: Negative for congestion, ear discharge, ear pain and sinus pain.   Eyes: Negative for pain, discharge and redness.  Respiratory: Positive for cough and wheezing. Negative for sputum production and shortness of breath.   Cardiovascular: Negative.  Negative for chest pain and palpitations.  Gastrointestinal: Negative for abdominal pain, constipation, diarrhea, heartburn, nausea and vomiting.  Skin: Negative.  Negative for itching and rash.  Neurological: Negative for dizziness and headaches.  Endo/Heme/Allergies: Positive for environmental allergies. Does not bruise/bleed easily.       Objective:   Blood pressure 92/70, pulse 100, temperature 97.6 F (36.4 C), resp. rate 14, height 5' 10.75" (1.797 m), weight 181 lb (82.1 kg), SpO2 92 %. Body mass index is 25.42 kg/m.   Physical Exam:  Physical Exam Constitutional:      Appearance: He is well-developed.  HENT:     Head: Normocephalic and atraumatic.     Right Ear: Tympanic membrane, ear canal and external ear normal.     Left Ear: Tympanic membrane, ear canal and external ear normal.     Nose: No nasal deformity, septal deviation, mucosal edema or rhinorrhea.     Right Turbinates: Enlarged and swollen.     Left Turbinates: Enlarged and swollen.     Right Sinus: No maxillary sinus tenderness or frontal sinus tenderness.     Left Sinus: No maxillary sinus tenderness or frontal sinus tenderness.      Mouth/Throat:     Mouth: Mucous membranes are not pale and not dry.     Pharynx: Uvula midline.  Eyes:     General:        Right eye: No discharge.        Left eye: No discharge.     Conjunctiva/sclera: Conjunctivae normal.     Right eye: Right conjunctiva is not injected. No chemosis.    Left eye: Left conjunctiva is not injected. No chemosis.    Pupils: Pupils are equal, round, and reactive to light.  Cardiovascular:     Rate and Rhythm: Normal rate and regular rhythm.     Heart sounds: Normal heart sounds.  Pulmonary:     Effort: Respiratory distress present. No tachypnea or accessory muscle usage.     Breath sounds: Examination of the right-upper field reveals wheezing. Examination of the left-upper field reveals wheezing. Examination of the right-middle field reveals wheezing. Examination of the left-middle field reveals wheezing. Wheezing present. No rhonchi or rales.  Chest:     Chest wall: No tenderness.  Lymphadenopathy:  Cervical: No cervical adenopathy.  Skin:    Coloration: Skin is not pale.     Findings: No abrasion, erythema, petechiae or rash. Rash is not papular, urticarial or vesicular.  Neurological:     Mental Status: He is alert.      Diagnostic studies:    Spirometry: results normal (FEV1: 3.17/112%, FVC: 3.84/101%, FEV1/FVC: 83%).    Spirometry consistent with normal pattern. However, he was coughing quite a bit after doing the spirometry. We did give him a puffs of Xopenex to clear up his wheezing. He is having much better afterwards.  Allergy Studies: none       Salvatore Marvel, MD  [facility]

## 2020-04-20 NOTE — Patient Instructions (Addendum)
1. Severe persistent asthma, uncomplicated - Lung testing looks terrible again and you sounded bad when you got here. - We did give you 8 puffs of Xopenex and this helped improve your airflow a lot.  - We need to get Xolair on board to help with your breathing. - Fill out the paperwork provided today and send back at your earliest convenience so that we can get the approval process started.  - Consent for Xolair signed.  - Spacer sample and demonstration provided (to use with the albuterol rescue medication) - Daily controller medication(s): Trelegy 200/62.5/25 one puff once daily   - Prior to physical activity: albuterol 2 puffs 10-15 minutes before physical activity. - Rescue medications: albuterol 4 puffs every 4-6 hours as needed - Asthma control goals:  * Full participation in all desired activities (may need albuterol before activity) * Albuterol use two time or less a week on average (not counting use with activity) * Cough interfering with sleep two time or less a month * Oral steroids no more than once a year * No hospitalizations  2. Chronic rhinitis (dust mite, cat, dog, grass, trees, and ragweed) - Continue taking: Xyzal (levocetirizine) 5mg  tablet once daily - You can use an extra dose of the antihistamine, if needed, for breakthrough symptoms.  - Consider nasal saline rinses 1-2 times daily to remove allergens from the nasal cavities as well as help with mucous clearance (this is especially helpful to do before the nasal sprays are given)  3. Return in about 2 months (around 06/20/2020).   Please inform us of any Emergency Department visits, hospitalizations, or changes in symptoms. Call us before going to the ED for breathing or allergy symptoms since we might be able to fit you in for a sick visit. Feel free to contact us anytime with any questions, problems, or concerns.  It was a pleasure to see you again today!  Websites that have reliable patient information: 1.  American Academy of Asthma, Allergy, and Immunology: www.aaaai.org 2. Food Allergy Research and Education (FARE): foodallergy.org 3. Mothers of Asthmatics: http://www.asthmacommunitynetwork.org 4. American College of Allergy, Asthma, and Immunology: www.acaai.org   COVID-19 Vaccine Information can be found at: ShippingScam.co.uk For questions related to vaccine distribution or appointments, please email [email or web address] or call [phone].     "Like" [place] on Facebook and Instagram for our latest updates!     HAPPY FALL!     Make sure you are registered to vote! If you have moved or changed any of your contact information, you will need to get this updated before voting!  In some cases, you MAY be able to register to vote online: [email or web address]

## 2020-05-06 ENCOUNTER — Telehealth: Payer: Self-pay | Admitting: *Deleted

## 2020-05-06 NOTE — Telephone Encounter (Signed)
-----   Message from Valentina Shaggy, MD sent at 04/20/2020  1:05 PM EST ----- I gave him the paperwork you faxed and a stamped envelope so that he can send it back to [place]. I would probably reach out to him in the next day or two.

## 2020-05-06 NOTE — Telephone Encounter (Signed)
Tried to reach patient to see if he is still interested in [facility] [place] and inquire if he ever mailed the application back to try and get free Xolair thru patient assistance but mailbox full and unable to leave message

## 2020-06-02 NOTE — Telephone Encounter (Signed)
Patient never returned application and unable to reach patient by phone to leave message regarding same

## 2020-06-02 NOTE — Telephone Encounter (Signed)
Oh well. Thanks for trying!   Skyeler Scalese, MD [facility]  

## 2020-06-22 ENCOUNTER — Ambulatory Visit: Payer: Medicare Other | Admitting: Allergy & Immunology

## 2020-07-02 DIAGNOSIS — Z01812 Encounter for preprocedural laboratory examination: Secondary | ICD-10-CM | POA: Diagnosis not present

## 2020-07-06 DIAGNOSIS — K2930 Chronic superficial gastritis without bleeding: Secondary | ICD-10-CM | POA: Diagnosis not present

## 2020-07-07 DIAGNOSIS — R195 Other fecal abnormalities: Secondary | ICD-10-CM | POA: Diagnosis not present

## 2020-07-07 DIAGNOSIS — D125 Benign neoplasm of sigmoid colon: Secondary | ICD-10-CM | POA: Diagnosis not present

## 2020-07-07 DIAGNOSIS — K3189 Other diseases of stomach and duodenum: Secondary | ICD-10-CM | POA: Diagnosis not present

## 2020-07-07 DIAGNOSIS — D124 Benign neoplasm of descending colon: Secondary | ICD-10-CM | POA: Diagnosis not present

## 2020-07-07 DIAGNOSIS — K222 Esophageal obstruction: Secondary | ICD-10-CM | POA: Diagnosis not present

## 2020-07-07 DIAGNOSIS — K2930 Chronic superficial gastritis without bleeding: Secondary | ICD-10-CM | POA: Diagnosis not present

## 2020-07-07 DIAGNOSIS — D123 Benign neoplasm of transverse colon: Secondary | ICD-10-CM | POA: Diagnosis not present

## 2020-07-07 DIAGNOSIS — R12 Heartburn: Secondary | ICD-10-CM | POA: Diagnosis not present

## 2020-07-07 DIAGNOSIS — K219 Gastro-esophageal reflux disease without esophagitis: Secondary | ICD-10-CM | POA: Diagnosis not present

## 2020-07-07 DIAGNOSIS — K2980 Duodenitis without bleeding: Secondary | ICD-10-CM | POA: Diagnosis not present

## 2020-07-07 DIAGNOSIS — R634 Abnormal weight loss: Secondary | ICD-10-CM | POA: Diagnosis not present

## 2020-07-07 DIAGNOSIS — K921 Melena: Secondary | ICD-10-CM | POA: Diagnosis not present

## 2020-07-09 DIAGNOSIS — D124 Benign neoplasm of descending colon: Secondary | ICD-10-CM | POA: Diagnosis not present

## 2020-07-09 DIAGNOSIS — K2980 Duodenitis without bleeding: Secondary | ICD-10-CM | POA: Diagnosis not present

## 2020-07-09 DIAGNOSIS — D123 Benign neoplasm of transverse colon: Secondary | ICD-10-CM | POA: Diagnosis not present

## 2020-07-09 DIAGNOSIS — K2930 Chronic superficial gastritis without bleeding: Secondary | ICD-10-CM | POA: Diagnosis not present

## 2020-07-09 DIAGNOSIS — D125 Benign neoplasm of sigmoid colon: Secondary | ICD-10-CM | POA: Diagnosis not present

## 2020-07-23 ENCOUNTER — Other Ambulatory Visit: Payer: Self-pay

## 2020-07-23 ENCOUNTER — Encounter: Payer: Self-pay | Admitting: Allergy

## 2020-07-23 ENCOUNTER — Ambulatory Visit: Payer: Medicare Other | Admitting: Allergy

## 2020-07-23 VITALS — BP 98/72 | HR 76 | Resp 14

## 2020-07-23 DIAGNOSIS — J3089 Other allergic rhinitis: Secondary | ICD-10-CM | POA: Diagnosis not present

## 2020-07-23 DIAGNOSIS — J302 Other seasonal allergic rhinitis: Secondary | ICD-10-CM | POA: Diagnosis not present

## 2020-07-23 DIAGNOSIS — J4550 Severe persistent asthma, uncomplicated: Secondary | ICD-10-CM

## 2020-07-23 NOTE — Progress Notes (Signed)
Follow-up Note  RE: Mason [name] MRN: [id number] DOB: August 16, 1945 Date of Office Visit: 07/23/2020   History of present illness: Mason [name] is a 75 y.o. male presenting today for sick visit.  He has history of severe persistent asthma and allergic rhinitis.  He was last seen in the office on 04/20/2020 by Dr. Ernst Bowler. He states he has been having chest tightness, shortness of breath, cough with a clear phlegm and wheezing.  He states he has been having the symptoms for quite some time but does not recall exactly for how long.  He states he is using Trelegy 1 puff once a day but that he does forget sometimes.  He states he most likely uses it at least 4 times a week.  He states he is using his albuterol however prior to activity and most days twice a day for symptom relief. It appears Dr. Ernst Bowler was trying to arrange him to start on Xolair and several attempts were made to contact the patient to apply for the drug assistance program but we were not able to reach him.  He states no one called him.  He states that we most likely was calling his wife's number who he states does not answer the phone.  He also states he has a new number that he provided me today that is [phone].  He states he does not have any issues with allergies and states he is not using Xyzal.    Review of systems: Review of Systems  Constitutional: Negative.   HENT: Negative.   Eyes: Negative.   Respiratory: Positive for cough, sputum production, shortness of breath and wheezing.   Cardiovascular: Negative.   Gastrointestinal: Negative.   Musculoskeletal: Negative.   Skin: Negative.   Neurological: Negative.     All other systems negative unless noted above in HPI  Past medical/social/surgical/family history have been reviewed and are unchanged unless specifically indicated below.  No changes  Medication List: Current Outpatient Medications  Medication Sig Dispense Refill  . albuterol (VENTOLIN HFA)  108 (90 Base) MCG/ACT inhaler Inhale 1-2 puffs into the lungs every 6 (six) hours as needed for wheezing or shortness of breath. 18 g 5  . Fluticasone-Umeclidin-Vilant (TRELEGY ELLIPTA) 200-62.5-25 MCG/INH AEPB Inhale 1 puff into the lungs daily. 30 each 5   No current facility-administered medications for this visit.     Known medication allergies: No Known Allergies   Physical examination: Blood pressure 98/72, pulse 76, resp. rate 14, SpO2 92 %.  General: Alert, interactive, in no acute distress. HEENT: PERRLA, TMs pearly gray, turbinates non-edematous without discharge, post-pharynx non erythematous. Neck: Supple without lymphadenopathy. Lungs: Mildly decreased breath sounds with expiratory wheezing bilaterally. {no increased work of breathing. CV: Normal S1, S2 without murmurs. Abdomen: Nondistended, nontender. Skin: Warm and dry, without lesions or rashes. Extremities:  No clubbing, cyanosis or edema. Neuro:   Grossly intact.  Diagnositics/Labs: Spirometry: FEV1: 1.25L 43%, FVC: 1.93L 50% predicted.  He did not have any improvement status post 4 puffs of Xopenex  Assessment and plan:   Severe persistent asthma - not well controlled - Lung testing is very low and we gave you Xopenex rescue inhaler in office today - We need to get Xolair on board to help with your breathing.  You talked with Tammy today who is our biologic medication who orders and handles approvals of this medication.  She will call you once we have approval to get started.  - You completed  the paperwork today and  we will send it in to see if you qualify for the drug assistance program - use Spacer device with albuterol (proair) - take prednisone 2 tab twice a day for 3 days, then 2 tab once a day for 1 day then 1 tab once a day for 1 day and stop.  This will help you to breathe better - Daily controller medication(s): Trelegy 200/62.5/25 1 puff once daily   - Prior to physical activity: albuterol (ProAir)  2 puffs 10-15 minutes before physical activity. - Rescue medications: albuterol (ProAir) 4 puffs every 4-6 hours as needed - Asthma control goals:  * Full participation in all desired activities (may need albuterol before activity) * Albuterol use two time or less a week on average (not counting use with activity) * Cough interfering with sleep two time or less a month * Oral steroids no more than once a year * No hospitalizations  Chronic rhinitis (dust mite, cat, dog, grass, trees, and ragweed) - Can take as needed: Xyzal (levocetirizine) 5mg  tablet once daily - You can use an extra dose of the antihistamine, if needed, for breakthrough symptoms.  - Consider nasal saline rinses 1-2 times daily to remove allergens from the nasal cavities as well as help with mucous clearance (this is especially helpful to do before the nasal sprays are given)  Follow-up in 2 months with Dr. Ernst Bowler  I appreciate the opportunity to take part in Mason [name]'s care. Please do not hesitate to contact me with questions.  Sincerely,   Prudy Feeler, MD Allergy/Immunology [facility]

## 2020-07-23 NOTE — Patient Instructions (Addendum)
1. Severe persistent asthma - not well controlled - Lung testing is very low and we gave you Xopenex rescue inhaler in office today - We need to get Xolair on board to help with your breathing.  You talked with Tammy today who is our biologic medication who orders and handles approvals of this medication.  She will call you once we have approval to get started.  - You completed  the paperwork today and we will send it in to see if you qualify for the drug assistance program - use Spacer device with albuterol (proair) - take prednisone 2 tab twice a day for 3 days, then 2 tab once a day for 1 day then 1 tab once a day for 1 day and stop.  This will help you to breathe better - Daily controller medication(s): Trelegy 200/62.5/25 1 puff once daily   - Prior to physical activity: albuterol (ProAir) 2 puffs 10-15 minutes before physical activity. - Rescue medications: albuterol (ProAir) 4 puffs every 4-6 hours as needed - Asthma control goals:  * Full participation in all desired activities (may need albuterol before activity) * Albuterol use two time or less a week on average (not counting use with activity) * Cough interfering with sleep two time or less a month * Oral steroids no more than once a year * No hospitalizations  2. Chronic rhinitis (dust mite, cat, dog, grass, trees, and ragweed) - Can take as needed: Xyzal (levocetirizine) 5mg  tablet once daily - You can use an extra dose of the antihistamine, if needed, for breakthrough symptoms.  - Consider nasal saline rinses 1-2 times daily to remove allergens from the nasal cavities as well as help with mucous clearance (this is especially helpful to do before the nasal sprays are given)  Follow-up in 2 months with Dr. Ernst Bowler

## 2020-07-27 ENCOUNTER — Ambulatory Visit: Payer: Self-pay | Admitting: Surgery

## 2020-07-27 DIAGNOSIS — D126 Benign neoplasm of colon, unspecified: Secondary | ICD-10-CM

## 2020-07-27 DIAGNOSIS — J45909 Unspecified asthma, uncomplicated: Secondary | ICD-10-CM | POA: Diagnosis not present

## 2020-07-27 DIAGNOSIS — K4020 Bilateral inguinal hernia, without obstruction or gangrene, not specified as recurrent: Secondary | ICD-10-CM | POA: Diagnosis not present

## 2020-07-27 DIAGNOSIS — I82891 Chronic embolism and thrombosis of other specified veins: Secondary | ICD-10-CM

## 2020-07-27 NOTE — H&P (Signed)
[name] Appointment: 07/27/2020 11:15 AM Location: [place] [facility] Patient #: [id number] DOB: 11-07-1945 Married / Language: English / Race: Black or African American Male   History of Present Illness [name]; 07/27/2020 11:24 AM) The patient is a 75 year old male who presents with a colonic polyp. Note for "Colonic polyp": ` ` ` Patient sent for surgical consultation at the request of Dr Pamalee Leyden [facility]  Chief Complaint: large colon polypoid mass with high-grade dysplasia. Presumed in transverse colon. Consider resection. ` ` The patient is a elderly gentleman has had intermittent rectal bleeding since 2014 after a colon cleanse. Usually moves his bowels a couple times a day. Occasionally takes Pepto. However he's notice more blood this year. Saw gastroenterology. They recommended endoscopic evaluation. Polypoid mass in the mid transverse colon noted. Shows adenomatous tissue with high-grade dysplasia. Surgical consultation recommended. patient has a history of bad gallstone pancreatitis requiring admission and cholecystectomy 2015. He has some seasonal allergies with asthma as well as gout.  patient comes in today with his daughter and grandson. Usually goes for a half hour walk every day. He does have moderate asthma and uses inhaler several times a week. However that is rather stable. He does not smoke. He did have an episode of severe pancreatitis that required cholecystectomy. Splenic vein thrombosis. He is not on blood thinners up. She is not diabetic. No sleep apnea. No cardiac or pulmonary issues. Usually moves his bowels about twice a day. He is convinced that this all started after he done a colon cleanse enema in 2014. Noted he got bleeding then. No family history of bowel problems that he is aware of. No personal nor family history of GI/colon cancer, inflammatory bowel disease, irritable bowel syndrome, allergy such as Celiac Sprue,  dietary/dairy problems, colitis, ulcers nor gastritis. No recent sick contacts/gastroenteritis. No travel outside the country. No changes in diet. No dysphagia to solids or liquids. No significant heartburn or reflux. No melena, hematemesis, coffee ground emesis. No evidence of prior gastric/peptic ulceration.  (Review of systems as stated in this history (HPI) or in the review of systems. Otherwise all other 12 point ROS are negative) ` ` ###########################################`  This patient encounter took 35 minutes today to perform the following: obtain history, perform exam, review outside records, interpret tests & imaging, counsel the patient on their diagnosis; and, document this encounter, including findings & plan in the electronic health record (EHR).   Past Surgical History Illene Regulus, CMA; 07/27/2020 10:58 AM) Liver Surgery  Pancreas Surgery   Diagnostic Studies History Lars Mage Spillers, CMA; 07/27/2020 10:58 AM) Colonoscopy  1-5 years ago  Allergies Lars Mage Spillers, CMA; 07/27/2020 11:00 AM) No Known Drug Allergies  [07/27/2020]:  Medication History (Alisha Spillers, CMA; 07/27/2020 11:00 AM) Albuterol Sulfate HFA (108 (90 Base)MCG/ACT Aerosol Soln, Inhalation) Active. Trelegy Ellipta (200-62.5-25MCG/INH Aero Pow Br Act, Inhalation) Active. Medications Reconciled  Family History Illene Regulus, CMA; 07/27/2020 10:58 AM) Breast Cancer  Mother, Sister. Diabetes Mellitus  Brother. Family history unknown  First Degree Relatives   Other Problems Illene Regulus, CMA; 07/27/2020 10:58 AM) Asthma  Chronic Obstructive Lung Disease     Review of Systems (Alisha Spillers CMA; 07/27/2020 10:58 AM) General Not Present- Appetite Loss, Chills, Fatigue, Fever, Night Sweats, Weight Gain and Weight Loss. Skin Not Present- Change in Wart/Mole, Dryness, Hives, Jaundice, New Lesions, Non-Healing Wounds, Rash and Ulcer. HEENT Present- Seasonal Allergies and Wears  glasses/contact lenses. Not Present- Earache, Hearing Loss, Hoarseness, Nose Bleed, Oral Ulcers, Ringing in  the Ears, Sinus Pain, Sore Throat, Visual Disturbances and Yellow Eyes. Respiratory Not Present- Bloody sputum, Chronic Cough, Difficulty Breathing, Snoring and Wheezing. Breast Not Present- Breast Mass, Breast Pain, Nipple Discharge and Skin Changes. Cardiovascular Not Present- Chest Pain, Difficulty Breathing Lying Down, Leg Cramps, Palpitations, Rapid Heart Rate, Shortness of Breath and Swelling of Extremities. Gastrointestinal Present- Bloody Stool. Not Present- Abdominal Pain, Bloating, Change in Bowel Habits, Chronic diarrhea, Constipation, Difficulty Swallowing, Excessive gas, Gets full quickly at meals, Hemorrhoids, Indigestion, Nausea, Rectal Pain and Vomiting. Male Genitourinary Present- Change in Urinary Stream. Not Present- Blood in Urine, Frequency, Impotence, Nocturia, Painful Urination, Urgency and Urine Leakage. Musculoskeletal Not Present- Back Pain, Joint Pain, Joint Stiffness, Muscle Pain, Muscle Weakness and Swelling of Extremities. Neurological Not Present- Decreased Memory, Fainting, Headaches, Numbness, Seizures, Tingling, Tremor, Trouble walking and Weakness. Psychiatric Not Present- Anxiety, Bipolar, Change in Sleep Pattern, Depression, Fearful and Frequent crying. Endocrine Not Present- Cold Intolerance, Excessive Hunger, Hair Changes, Heat Intolerance, Hot flashes and New Diabetes. Hematology Not Present- Blood Thinners, Easy Bruising, Excessive bleeding, Gland problems, HIV and Persistent Infections.  Vitals (Alisha Spillers CMA; 07/27/2020 10:59 AM) 07/27/2020 10:59 AM Weight: 184.8 lb Height: 70.5in Body Surface Area: 2.03 m Body Mass Index: 26.14 kg/m  Pulse: 76 (Regular)  BP: 120/78(Sitting, Left Arm, Standard)       Physical Exam [name]; 07/27/2020 11:10 AM) General Mental Status-Alert. General Appearance-Not in acute distress,  Not Sickly. Orientation-Oriented X3. Hydration-Well hydrated. Voice-Normal.  Integumentary Global Assessment Upon inspection and palpation of skin surfaces of the - Axillae: non-tender, no inflammation or ulceration, no drainage. and Distribution of scalp and body hair is normal. General Characteristics Temperature - normal warmth is noted.  Head and Neck Head-normocephalic, atraumatic with no lesions or palpable masses. Face Global Assessment - atraumatic, no absence of expression. Neck Global Assessment - no abnormal movements, no bruit auscultated on the right, no bruit auscultated on the left, no decreased range of motion, non-tender. Trachea-midline. Thyroid Gland Characteristics - non-tender.  Eye Eyeball - Left-Extraocular movements intact, No Nystagmus - Left. Eyeball - Right-Extraocular movements intact, No Nystagmus - Right. Cornea - Left-No Hazy - Left. Cornea - Right-No Hazy - Right. Sclera/Conjunctiva - Left-No scleral icterus, No Discharge - Left. Sclera/Conjunctiva - Right-No scleral icterus, No Discharge - Right. Pupil - Left-Direct reaction to light normal. Pupil - Right-Direct reaction to light normal.  ENMT Ears Pinna - Left - no drainage observed, no generalized tenderness observed. Pinna - Right - no drainage observed, no generalized tenderness observed. Nose and Sinuses External Inspection of the Nose - no destructive lesion observed. Inspection of the nares - Left - quiet respiration. Inspection of the nares - Right - quiet respiration. Mouth and Throat Lips - Upper Lip - no fissures observed, no pallor noted. Lower Lip - no fissures observed, no pallor noted. Nasopharynx - no discharge present. Oral Cavity/Oropharynx - Tongue - no dryness observed. Oral Mucosa - no cyanosis observed. Hypopharynx - no evidence of airway distress observed.  Chest and Lung Exam Inspection Movements - Normal and Symmetrical. Accessory muscles - No  use of accessory muscles in breathing. Palpation Palpation of the chest reveals - Non-tender. Auscultation Breath sounds - Normal and Clear.  Cardiovascular Auscultation Rhythm - Regular. Murmurs & Other Heart Sounds - Auscultation of the heart reveals - No Murmurs and No Systolic Clicks.  Abdomen Inspection Inspection of the abdomen reveals - No Visible peristalsis and No Abnormal pulsations. Umbilicus - No Bleeding, No Urine drainage. Palpation/Percussion  Palpation and Percussion of the abdomen reveal - Soft, Non Tender, No Rebound tenderness, No Rigidity (guarding) and No Cutaneous hyperesthesia. Note: small port site incisions consistent with prior cholecystectomy. No hernia. No abdominal pain. Abdomen soft. Nontender. Not distended. No umbilical or incisional hernias. No guarding.   Male Genitourinary Sexual Maturity Tanner 5 - Adult hair pattern and Adult penile size and shape. Note: left inguinal hernia with Valsalva. Possible right internal hernias well. Spontaneously reduces. Otherwise normal external male genitalia. No inguinal lymphadenopathy.   Peripheral Vascular Upper Extremity Inspection - Left - No Cyanotic nailbeds - Left, Not Ischemic. Inspection - Right - No Cyanotic nailbeds - Right, Not Ischemic.  Neurologic Neurologic evaluation reveals -normal attention span and ability to concentrate, able to name objects and repeat phrases. Appropriate fund of knowledge , normal sensation and normal coordination. Mental Status Affect - not angry, not paranoid. Cranial Nerves-Normal Bilaterally. Gait-Normal.  Neuropsychiatric Mental status exam performed with findings of-able to articulate well with normal speech/language, rate, volume and coherence, thought content normal with ability to perform basic computations and apply abstract reasoning and no evidence of hallucinations, delusions, obsessions or homicidal/suicidal ideation.  Musculoskeletal Global  Assessment Spine, Ribs and Pelvis - no instability, subluxation or laxity. Right Upper Extremity - no instability, subluxation or laxity.  Lymphatic Head & Neck  General Head & Neck Lymphatics: Bilateral - Description - No Localized lymphadenopathy. Axillary  General Axillary Region: Bilateral - Description - No Localized lymphadenopathy. Femoral & Inguinal  Generalized Femoral & Inguinal Lymphatics: Left - Description - No Localized lymphadenopathy. Right - Description - No Localized lymphadenopathy.    Assessment & Plan [name]; 07/27/2020 11:23 AM) HIGH GRADE DYSPLASIA IN COLONIC ADENOMA (D12.6) Impression: polypoid mass with at least high-grade dysplasia in colon. Felt to be probably transverse colon although hard to say.  CT scan abdomen and pelvis to help delineate anatomy make sure there is no evidence of metastatic disease or other surprises. 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 That seems better, be be helpful to delineate the anatomy is well  Segmental resection with anastomosis. hopefully these in robotic candidate. May need more lysis of adhesions from prior pancreatitis. We will see.  He seems to have Good exercise tolerance is not a smoker, so I do not feel that cardiac clearance is needed necessarily.  He is interested in proceeding. discussion made with the patient and his daughter as well. Questions answered. They wish to proceed. Current Plans Pt Education - Polyps in the Colon and Rectum (Colonic and Rectal Polyps): colonic polyps PREOP COLON - ENCOUNTER FOR PREOPERATIVE EXAMINATION FOR GENERAL SURGICAL PROCEDURE (Z01.818) Current Plans You are being scheduled for surgery- Our schedulers will call you.  You should hear from our office's scheduling department within 5 working days about the location, date, and time of surgery. We try to make accommodations for patient's preferences in  scheduling surgery, but sometimes the OR schedule or the surgeon's schedule prevents [place] from making those accommodations.  If you have not heard from our office [phone]) in 5 working days, call the office and ask for your surgeon's nurse.  If you have other questions about your diagnosis, plan, or surgery, call the office and ask for your surgeon's nurse.  Written instructions provided The anatomy & physiology of the digestive tract was discussed. The pathophysiology of the colon was discussed. Natural history risks without surgery was discussed. I feel the risks of no intervention will  lead to serious problems that outweigh the operative risks; therefore, I recommended a partial colectomy to remove the pathology. Minimally invasive (Robotic/Laparoscopic) & open techniques were discussed.  Risks such as bleeding, infection, abscess, leak, reoperation, possible ostomy, hernia, heart attack, death, and other risks were discussed. I noted a good likelihood this will help address the problem. Goals of post-operative recovery were discussed as well. Need for adequate nutrition, daily bowel regimen and healthy physical activity, to optimize recovery was noted as well. We will work to minimize complications. Educational materials were available as well. Questions were answered. The patient expresses understanding & wishes to proceed with surgery.  Pt Education - [facility] Colon Bowel Prep 2018 ERAS/Miralax/Antibiotics Pt Education - Pamphlet Given - Laparoscopic Colorectal Surgery: discussed with patient and provided information. Pt Education - [facility] Colectomy post-op instructions: discussed with patient and provided information. Started metroNIDAZOLE 500 MG Oral Tablet, 2 (two) Tablet three times daily, #6, 1 day starting 07/27/2020, No Refill. Local Order: Pharmacist Notes: Pharmacy Instructions: Take 2 tablets at 2pm, 3pm, and 10pm the day prior to your colon operation. Started Neomycin  Sulfate 500 MG Oral Tablet, 2 (two) Tablet SEE NOTE, #6, 07/27/2020, No Refill. Local Order: Pharmacist Notes: TAKE TWO TABLETS AT 2 PM, 3 PM, AND 10 PM THE DAY PRIOR TO SURGERY BILATERAL INGUINAL HERNIA WITHOUT OBSTRUCTION OR GANGRENE, RECURRENCE NOT SPECIFIED (K40.20) Impression: left greater than right and hernias. No peptic symptomatic. Would hold off on any surgical intervention at this time. Maybe consider in the future given his good health activity an expected longevity making his hernia is more likely an issue in the future.Marland Kitchen However clearly the colon mass is a higher priority at this time. ASTHMA, CHRONIC (J45.909)    Signed electronically by Adin Hector, MD (07/27/2020 11:25 AM)  Adin Hector, MD, FACS, MASCRS  Gastrointestinal and Minimally Invasive Surgery  [facility] [address] [place], [place] [address] [phone] Fax [phone] Main/Paging  CONTACT INFORMATION: Weekday (9AM-5PM) concerns: Call [facility] main office at [phone] Weeknight (5PM-9AM) or Weekend/Holiday concerns: Check [email or web address] for General Surgery [facility] coverage (Please, do not use SecureChat as it is not reliable communication to operating surgeons for immediate patient care)

## 2020-07-28 ENCOUNTER — Other Ambulatory Visit: Payer: Self-pay | Admitting: Surgery

## 2020-07-28 DIAGNOSIS — D126 Benign neoplasm of colon, unspecified: Secondary | ICD-10-CM

## 2020-08-03 ENCOUNTER — Inpatient Hospital Stay: Admission: RE | Admit: 2020-08-03 | Payer: Medicare Other | Source: Ambulatory Visit

## 2020-08-10 ENCOUNTER — Ambulatory Visit: Payer: Self-pay | Admitting: Surgery

## 2020-08-10 NOTE — H&P (Signed)
Mason [name] Appointment: 07/27/2020 11:15 AM Location: [place] Office Patient #: [phone] DOB: 09-29-1945 Married / Language: English / Race: Black or African American Male   History of Present Illness Mason [name]; 07/27/2020 11:24 AM) The patient is a 75 year old male who presents with a colonic polyp. Note for "Colonic polyp": ` ` ` Patient sent for surgical consultation at the request of Dr Pamalee Leyden [facility]  Chief Complaint: large colon polypoid mass with high-grade dysplasia. Presumed in transverse colon. Consider resection. ` ` The patient is a elderly gentleman has had intermittent rectal bleeding since 2014 after a colon cleanse. Usually moves his bowels a couple times a day. Occasionally takes Pepto. However he's notice more blood this year. Saw gastroenterology. They recommended endoscopic evaluation. Polypoid mass in the mid transverse colon noted. Shows adenomatous tissue with high-grade dysplasia. Surgical consultation recommended. patient has a history of bad gallstone pancreatitis requiring admission and cholecystectomy 2015. He has some seasonal allergies with asthma as well as gout.  patient comes in today with his daughter and grandson. Usually goes for a half hour walk every day. He does have moderate asthma and uses inhaler several times a week. However that is rather stable. He does not smoke. He did have an episode of severe pancreatitis that required cholecystectomy. Splenic vein thrombosis. He is not on blood thinners up. She is not diabetic. No sleep apnea. No cardiac or pulmonary issues. Usually moves his bowels about twice a day. He is convinced that this all started after he done a colon cleanse enema in 2014. Noted he got bleeding then. No family history of bowel problems that he is aware of. No personal nor family history of GI/colon cancer, inflammatory bowel disease, irritable bowel syndrome, allergy such as Celiac Sprue,  dietary/dairy problems, colitis, ulcers nor gastritis. No recent sick contacts/gastroenteritis. No travel outside the country. No changes in diet. No dysphagia to solids or liquids. No significant heartburn or reflux. No melena, hematemesis, coffee ground emesis. No evidence of prior gastric/peptic ulceration.  (Review of systems as stated in this history (HPI) or in the review of systems. Otherwise all other 12 point ROS are negative) ` ` ###########################################`  This patient encounter took 35 minutes today to perform the following: obtain history, perform exam, review outside records, interpret tests & imaging, counsel the patient on their diagnosis; and, document this encounter, including findings & plan in the electronic health record (EHR).   Past Surgical History Illene [name], CMA; 07/27/2020 10:58 AM) Liver Surgery  Pancreas Surgery   Diagnostic Studies History Mason [name], CMA; 07/27/2020 10:58 AM) Colonoscopy  1-5 years ago  Allergies Mason [name], CMA; 07/27/2020 11:00 AM) No Known Drug Allergies  [07/27/2020]:  Medication History (Mason [name], CMA; 07/27/2020 11:00 AM) Albuterol Sulfate HFA (108 (90 Base)MCG/ACT Aerosol Soln, Inhalation) Active. Trelegy Ellipta (200-62.5-25MCG/INH Aero Pow Br Act, Inhalation) Active. Medications Reconciled  Family History Illene [name], CMA; 07/27/2020 10:58 AM) Breast Cancer  Mother, Sister. Diabetes Mellitus  Brother. Family history unknown  First Degree Relatives   Other Problems Illene [name], CMA; 07/27/2020 10:58 AM) Asthma  Chronic Obstructive Lung Disease     Review of Systems (Mason [name] CMA; 07/27/2020 10:58 AM) General Not Present- Appetite Loss, Chills, Fatigue, Fever, Night Sweats, Weight Gain and Weight Loss. Skin Not Present- Change in Wart/Mole, Dryness, Hives, Jaundice, New Lesions, Non-Healing Wounds, Rash and Ulcer. HEENT Present- Seasonal Allergies and Wears  glasses/contact lenses. Not Present- Earache, Hearing Loss, Hoarseness, Nose Bleed, Oral Ulcers, Ringing  in the Ears, Sinus Pain, Sore Throat, Visual Disturbances and Yellow Eyes. Respiratory Not Present- Bloody sputum, Chronic Cough, Difficulty Breathing, Snoring and Wheezing. Breast Not Present- Breast Mass, Breast Pain, Nipple Discharge and Skin Changes. Cardiovascular Not Present- Chest Pain, Difficulty Breathing Lying Down, Leg Cramps, Palpitations, Rapid Heart Rate, Shortness of Breath and Swelling of Extremities. Gastrointestinal Present- Bloody Stool. Not Present- Abdominal Pain, Bloating, Change in Bowel Habits, Chronic diarrhea, Constipation, Difficulty Swallowing, Excessive gas, Gets full quickly at meals, Hemorrhoids, Indigestion, Nausea, Rectal Pain and Vomiting. Male Genitourinary Present- Change in Urinary Stream. Not Present- Blood in Urine, Frequency, Impotence, Nocturia, Painful Urination, Urgency and Urine Leakage. Musculoskeletal Not Present- Back Pain, Joint Pain, Joint Stiffness, Muscle Pain, Muscle Weakness and Swelling of Extremities. Neurological Not Present- Decreased Memory, Fainting, Headaches, Numbness, Seizures, Tingling, Tremor, Trouble walking and Weakness. Psychiatric Not Present- Anxiety, Bipolar, Change in Sleep Pattern, Depression, Fearful and Frequent crying. Endocrine Not Present- Cold Intolerance, Excessive Hunger, Hair Changes, Heat Intolerance, Hot flashes and New Diabetes. Hematology Not Present- Blood Thinners, Easy Bruising, Excessive bleeding, Gland problems, HIV and Persistent Infections.  Vitals (Mason [name] CMA; 07/27/2020 10:59 AM) 07/27/2020 10:59 AM Weight: 184.8 lb Height: 70.5in Body Surface Area: 2.03 m Body Mass Index: 26.14 kg/m  Pulse: 76 (Regular)  BP: 120/78(Sitting, Left Arm, Standard)       Physical Exam Mason [name]; 07/27/2020 11:10 AM) General Mental Status-Alert. General Appearance-Not in acute distress,  Not Sickly. Orientation-Oriented X3. Hydration-Well hydrated. Voice-Normal.  Integumentary Global Assessment Upon inspection and palpation of skin surfaces of the - Axillae: non-tender, no inflammation or ulceration, no drainage. and Distribution of scalp and body hair is normal. General Characteristics Temperature - normal warmth is noted.  Head and Neck Head-normocephalic, atraumatic with no lesions or palpable masses. Face Global Assessment - atraumatic, no absence of expression. Neck Global Assessment - no abnormal movements, no bruit auscultated on the right, no bruit auscultated on the left, no decreased range of motion, non-tender. Trachea-midline. Thyroid Gland Characteristics - non-tender.  Eye Eyeball - Left-Extraocular movements intact, No Nystagmus - Left. Eyeball - Right-Extraocular movements intact, No Nystagmus - Right. Cornea - Left-No Hazy - Left. Cornea - Right-No Hazy - Right. Sclera/Conjunctiva - Left-No scleral icterus, No Discharge - Left. Sclera/Conjunctiva - Right-No scleral icterus, No Discharge - Right. Pupil - Left-Direct reaction to light normal. Pupil - Right-Direct reaction to light normal.  ENMT Ears Pinna - Left - no drainage observed, no generalized tenderness observed. Pinna - Right - no drainage observed, no generalized tenderness observed. Nose and Sinuses External Inspection of the Nose - no destructive lesion observed. Inspection of the nares - Left - quiet respiration. Inspection of the nares - Right - quiet respiration. Mouth and Throat Lips - Upper Lip - no fissures observed, no pallor noted. Lower Lip - no fissures observed, no pallor noted. Nasopharynx - no discharge present. Oral Cavity/Oropharynx - Tongue - no dryness observed. Oral Mucosa - no cyanosis observed. Hypopharynx - no evidence of airway distress observed.  Chest and Lung Exam Inspection Movements - Normal and Symmetrical. Accessory muscles - No  use of accessory muscles in breathing. Palpation Palpation of the chest reveals - Non-tender. Auscultation Breath sounds - Normal and Clear.  Cardiovascular Auscultation Rhythm - Regular. Murmurs & Other Heart Sounds - Auscultation of the heart reveals - No Murmurs and No Systolic Clicks.  Abdomen Inspection Inspection of the abdomen reveals - No Visible peristalsis and No Abnormal pulsations. Umbilicus - No Bleeding, No Urine drainage.  Palpation/Percussion Palpation and Percussion of the abdomen reveal - Soft, Non Tender, No Rebound tenderness, No Rigidity (guarding) and No Cutaneous hyperesthesia. Note: small port site incisions consistent with prior cholecystectomy. No hernia. No abdominal pain. Abdomen soft. Nontender. Not distended. No umbilical or incisional hernias. No guarding.   Male Genitourinary Sexual Maturity Tanner 5 - Adult hair pattern and Adult penile size and shape. Note: left inguinal hernia with Valsalva. Possible right internal hernias well. Spontaneously reduces. Otherwise normal external male genitalia. No inguinal lymphadenopathy.   Peripheral Vascular Upper Extremity Inspection - Left - No Cyanotic nailbeds - Left, Not Ischemic. Inspection - Right - No Cyanotic nailbeds - Right, Not Ischemic.  Neurologic Neurologic evaluation reveals -normal attention span and ability to concentrate, able to name objects and repeat phrases. Appropriate fund of knowledge , normal sensation and normal coordination. Mental Status Affect - not angry, not paranoid. Cranial Nerves-Normal Bilaterally. Gait-Normal.  Neuropsychiatric Mental status exam performed with findings of-able to articulate well with normal speech/language, rate, volume and coherence, thought content normal with ability to perform basic computations and apply abstract reasoning and no evidence of hallucinations, delusions, obsessions or homicidal/suicidal ideation.  Musculoskeletal Global  Assessment Spine, Ribs and Pelvis - no instability, subluxation or laxity. Right Upper Extremity - no instability, subluxation or laxity.  Lymphatic Head & Neck  General Head & Neck Lymphatics: Bilateral - Description - No Localized lymphadenopathy. Axillary  General Axillary Region: Bilateral - Description - No Localized lymphadenopathy. Femoral & Inguinal  Generalized Femoral & Inguinal Lymphatics: Left - Description - No Localized lymphadenopathy. Right - Description - No Localized lymphadenopathy.    Assessment & Plan Mason [name]; 07/27/2020 11:23 AM) HIGH GRADE DYSPLASIA IN COLONIC ADENOMA (D12.6) Impression: polypoid mass with at least high-grade dysplasia in colon. Felt to be probably transverse colon although hard to say.  CT scan abdomen and pelvis to help delineate anatomy make sure there is no evidence of metastatic disease or other surprises. 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 That seems better, be be helpful to delineate the anatomy is well  Segmental resection with anastomosis. hopefully these in robotic candidate. May need more lysis of adhesions from prior pancreatitis. We will see.  He seems to have Good exercise tolerance is not a smoker, so I do not feel that cardiac clearance is needed necessarily.  He is interested in proceeding. discussion made with the patient and his daughter as well. Questions answered. They wish to proceed. Current Plans Pt Education - Polyps in the Colon and Rectum (Colonic and Rectal Polyps): colonic polyps PREOP COLON - ENCOUNTER FOR PREOPERATIVE EXAMINATION FOR GENERAL SURGICAL PROCEDURE (Z01.818) Current Plans You are being scheduled for surgery- Our schedulers will call you.  You should hear from our office's scheduling department within 5 working days about the location, date, and time of surgery. We try to make accommodations for patient's preferences in  scheduling surgery, but sometimes the OR schedule or the surgeon's schedule prevents [place] from making those accommodations.  If you have not heard from our office [phone]) in 5 working days, call the office and ask for your surgeon's nurse.  If you have other questions about your diagnosis, plan, or surgery, call the office and ask for your surgeon's nurse.  Written instructions provided The anatomy & physiology of the digestive tract was discussed. The pathophysiology of the colon was discussed. Natural history risks without surgery was discussed. I feel the risks of no intervention  will lead to serious problems that outweigh the operative risks; therefore, I recommended a partial colectomy to remove the pathology. Minimally invasive (Robotic/Laparoscopic) & open techniques were discussed.  Risks such as bleeding, infection, abscess, leak, reoperation, possible ostomy, hernia, heart attack, death, and other risks were discussed. I noted a good likelihood this will help address the problem. Goals of post-operative recovery were discussed as well. Need for adequate nutrition, daily bowel regimen and healthy physical activity, to optimize recovery was noted as well. We will work to minimize complications. Educational materials were available as well. Questions were answered. The patient expresses understanding & wishes to proceed with surgery.  Pt Education - [facility] Colon Bowel Prep 2018 ERAS/Miralax/Antibiotics Pt Education - Pamphlet Given - Laparoscopic Colorectal Surgery: discussed with patient and provided information. Pt Education - [facility] Colectomy post-op instructions: discussed with patient and provided information. Started metroNIDAZOLE 500 MG Oral Tablet, 2 (two) Tablet three times daily, #6, 1 day starting 07/27/2020, No Refill. Local Order: Pharmacist Notes: Pharmacy Instructions: Take 2 tablets at 2pm, 3pm, and 10pm the day prior to your colon operation. Started Neomycin  Sulfate 500 MG Oral Tablet, 2 (two) Tablet SEE NOTE, #6, 07/27/2020, No Refill. Local Order: Pharmacist Notes: TAKE TWO TABLETS AT 2 PM, 3 PM, AND 10 PM THE DAY PRIOR TO SURGERY BILATERAL INGUINAL HERNIA WITHOUT OBSTRUCTION OR GANGRENE, RECURRENCE NOT SPECIFIED (K40.20) Impression: left greater than right and hernias. No peptic symptomatic. Would hold off on any surgical intervention at this time. Maybe consider in the future given his good health activity an expected longevity making his hernia is more likely an issue in the future.Marland Kitchen However clearly the colon mass is a higher priority at this time. ASTHMA, CHRONIC (J45.909)    Signed electronically by Mason Hector, [name] (07/27/2020 11:25 AM)

## 2020-08-18 ENCOUNTER — Ambulatory Visit
Admission: RE | Admit: 2020-08-18 | Discharge: 2020-08-18 | Disposition: A | Discharge status: Home / Self Care | Payer: Medicare Other | Source: Ambulatory Visit | Attending: Surgery | Admitting: Surgery

## 2020-08-18 DIAGNOSIS — K4090 Unilateral inguinal hernia, without obstruction or gangrene, not specified as recurrent: Secondary | ICD-10-CM | POA: Diagnosis not present

## 2020-08-18 DIAGNOSIS — K859 Acute pancreatitis without necrosis or infection, unspecified: Secondary | ICD-10-CM | POA: Diagnosis not present

## 2020-08-18 DIAGNOSIS — D126 Benign neoplasm of colon, unspecified: Secondary | ICD-10-CM

## 2020-08-18 DIAGNOSIS — N281 Cyst of kidney, acquired: Secondary | ICD-10-CM | POA: Diagnosis not present

## 2020-08-18 DIAGNOSIS — K449 Diaphragmatic hernia without obstruction or gangrene: Secondary | ICD-10-CM | POA: Diagnosis not present

## 2020-08-18 MED ORDER — IOPAMIDOL (ISOVUE-300) INJECTION 61%
100.0000 mL | Freq: Once | INTRAVENOUS | Status: AC | PRN
  Administered 2020-08-18: 100 mL via INTRAVENOUS

## 2020-08-26 ENCOUNTER — Telehealth: Payer: Self-pay | Admitting: *Deleted

## 2020-08-26 DIAGNOSIS — K639 Disease of intestine, unspecified: Secondary | ICD-10-CM | POA: Diagnosis not present

## 2020-08-26 DIAGNOSIS — J45909 Unspecified asthma, uncomplicated: Secondary | ICD-10-CM | POA: Diagnosis not present

## 2020-08-26 NOTE — Telephone Encounter (Signed)
Called patient and advised approval for free Xolair through PAP and delivery scheduled for 4/14 so appt made for 4/22 to start therapy

## 2020-09-10 ENCOUNTER — Ambulatory Visit: Payer: Medicare Other

## 2020-09-10 ENCOUNTER — Other Ambulatory Visit: Payer: Self-pay

## 2020-09-10 ENCOUNTER — Ambulatory Visit (INDEPENDENT_AMBULATORY_CARE_PROVIDER_SITE_OTHER): Payer: Medicare Other

## 2020-09-10 DIAGNOSIS — J4540 Moderate persistent asthma, uncomplicated: Secondary | ICD-10-CM

## 2020-09-10 MED ORDER — EPINEPHRINE 0.3 MG/0.3ML IJ SOAJ: 0.3000 mg | INTRAMUSCULAR | 1 refills | Status: DC | PRN

## 2020-09-10 MED ORDER — OMALIZUMAB 150 MG/ML ~~LOC~~ SOSY
300.0000 mg | PREFILLED_SYRINGE | SUBCUTANEOUS | Status: AC
  Administered 2020-09-10 – 2021-04-07 (×6): 300 mg via SUBCUTANEOUS

## 2020-09-10 NOTE — Progress Notes (Signed)
Immunotherapy   Patient Details  Name: Mason [name] MRN: [id number] Date of Birth: 1945-11-10  09/10/2020  Mason [name] started injections for  Xolair injections for his asthma. Patient received 300 mg and will be coming in every 28 days. Patient waited 30 minutes without any issues.  A prescription for epinephrine device was sent to patient's pharmacy and demonstration on how to use it was also provided. Epi-Pen:Epi-Pen Available  Consent signed and patient instructions given.   Guy Franco 09/10/2020, 3:49 PM

## 2020-09-17 ENCOUNTER — Other Ambulatory Visit: Payer: Self-pay

## 2020-09-17 ENCOUNTER — Encounter: Payer: Self-pay | Admitting: Allergy

## 2020-09-17 ENCOUNTER — Ambulatory Visit: Payer: Medicare Other | Admitting: Allergy

## 2020-09-17 VITALS — BP 98/78 | HR 80 | Resp 18

## 2020-09-17 DIAGNOSIS — J4551 Severe persistent asthma with (acute) exacerbation: Secondary | ICD-10-CM | POA: Diagnosis not present

## 2020-09-17 DIAGNOSIS — J310 Chronic rhinitis: Secondary | ICD-10-CM | POA: Diagnosis not present

## 2020-09-17 MED ORDER — EPINEPHRINE 0.3 MG/0.3ML IJ SOAJ: INTRAMUSCULAR | 1 refills | Status: DC

## 2020-09-17 NOTE — Patient Instructions (Addendum)
1. Severe persistent asthma with exacerbation - Lung testing is very low and we gave you nebulizer treatment in the office - Continue Xolair injections every month.  Your next scheduled on 10/08/2020 - use Spacer device with albuterol (proair) - take prednisone 2 tab twice a day for 3 days, then 2 tab once a day for 1 day then 1 tab once a day for 1 day and stop.  This will help you to breathe better - Daily controller medication(s): Trelegy 200/62.5/25 1 puff once daily   - Prior to physical activity: albuterol (ProAir) 2 puffs 10-15 minutes before physical activity. - Rescue medications: albuterol (ProAir) 4 puffs every 4-6 hours as needed - Asthma control goals:  * Full participation in all desired activities (may need albuterol before activity) * Albuterol use two time or less a week on average (not counting use with activity) * Cough interfering with sleep two time or less a month * Oral steroids no more than once a year * No hospitalizations  2. Chronic rhinitis (dust mite, cat, dog, grass, trees, and ragweed) - Can take as needed: Xyzal (levocetirizine) 5mg  tablet once daily - You can use an extra dose of the antihistamine, if needed, for breakthrough symptoms.  - Consider nasal saline rinses 1-2 times daily to remove allergens from the nasal cavities as well as help with mucous clearance (this is especially helpful to do before the nasal sprays are given)  Follow-up in 2 months with Dr. Ernst Bowler

## 2020-09-17 NOTE — Progress Notes (Addendum)
COVID Vaccine Completed: x2 Date COVID Vaccine completed: Has received booster: N/A COVID vaccine manufacturer: Pfizer      Date of COVID positive in last 90 days:  N/A  PCP - London Pepper, MD Cardiologist - N/A  Chest x-ray - N/A EKG - N/A Stress Test - N/A ECHO - N/A Cardiac Cath - N/A Pacemaker/ICD device last checked: Spinal Cord Stimulator:  Sleep Study - N/A CPAP -   Fasting Blood Sugar - N/A Checks Blood Sugar _____ times a day  Blood Thinner Instructions:  N/A Aspirin Instructions: Last Dose:  Activity level:  Can go up a flight of stairs and perform activities of daily living without stopping and without symptoms of chest pain.  Patient does have shortness of breath with climbing stairs with asthma exacerbations.         Anesthesia review: Severe persistent asthma.  At PAT appt patient states that he worked in yard yesterday which exacerbated his asthma.   Had a mild cough, but stated that he was not SOB and felt that asthma was managed and that he was at his baseline.   Patient denies shortness of breath, fever and chest pain at PAT appointment   Patient verbalized understanding of instructions that were given to them at the PAT appointment. Patient was also instructed that they will need to review over the PAT instructions again at home before surgery.

## 2020-09-17 NOTE — Patient Instructions (Addendum)
DUE TO COVID-19 ONLY ONE VISITOR IS ALLOWED TO COME WITH YOU AND STAY IN THE WAITING ROOM ONLY DURING PRE OP AND PROCEDURE.   **NO VISITORS ARE ALLOWED IN THE SHORT STAY AREA OR RECOVERY ROOM!!**  IF YOU WILL BE ADMITTED INTO THE HOSPITAL YOU ARE ALLOWED ONLY TWO SUPPORT PEOPLE DURING VISITATION HOURS ONLY (10AM -8PM)   . The support person(s) may change daily. . The support person(s) must pass our screening, gel in and out, and wear a mask at all times, including in the patient's room. . Patients must also wear a mask when staff or their support person are in the room.  No visitors under the age of 54. Any visitor under the age of 81 must be accompanied by an adult.    COVID SWAB TESTING MUST BE COMPLETED ON:  Monday, 09-27-20 @ 10:15 AM   [address] [place], [place] [address]  (Must self quarantine after testing. Follow instructions on handout.)        Your procedure is scheduled on:  Wednesday, 09-29-20   Report to [facility] Main  Entrance   Report to admitting at 10:30 AM   Call this number if you have problems the morning of surgery [phone]   Do not eat food :After Midnight.   May have liquids until 9:30 AM  day of surgery    Follow a clear liquid diet day of prep to prevent dehydration  CLEAR LIQUID DIET  Foods Allowed                                                                     Foods Excluded  Water, Black Coffee and tea, regular and decaf             liquids that you cannot  Plain Jell-O in any flavor  (No red)                                    see through such as: Fruit ices (not with fruit pulp)                                      milk, soups, orange juice              Iced Popsicles (No red)                                      All solid food                                   Apple juices Sports drinks like Gatorade (No red) Lightly seasoned clear broth or consume(fat free) Sugar, honey syrup  Sample Menu Breakfast                                 Lunch  Supper Cranberry juice                    Beef broth                            Chicken broth Jell-O                                     Grape juice                           Apple juice Coffee or tea                        Jell-O                                      Popsicle                                                Coffee or tea                        Coffee or tea       Drink 2 Ensure drinks the night before surgery complete by 10:00 PM.  Complete one Ensure drink the morning of surgery 3   hours prior to scheduled surgery at 9:30 AM.     1. The day of surgery:  ? Drink ONE (1) Pre-Surgery Clear Ensure or G2 by am the morning of surgery. Drink in one sitting. Do not sip.  ? This drink was given to you during your hospital  pre-op appointment visit. ? Nothing else to drink after completing the  Pre-Surgery Clear Ensure or G2.          If you have questions, please contact your surgeon's office.     Oral Hygiene is also important to reduce your risk of infection.                                    Remember - BRUSH YOUR TEETH THE MORNING OF SURGERY WITH YOUR REGULAR TOOTHPASTE   Do NOT smoke after Midnight   Take these medicines the morning of surgery with A SIP OF WATER: None.  Okay to use inhalers                               You may not have any metal on your body including  jewelry, and body piercings             Do not wear lotions, powders, cologne, or deodorant             Men may shave face and neck.   Do not bring valuables to the hospital. [place].   Contacts, dentures or bridgework may not be worn into surgery.   Bring small overnight bag day of surgery.  Please read over the following fact sheets you were given: IF YOU HAVE QUESTIONS ABOUT YOUR PRE OP INSTRUCTIONS PLEASE CALL  [place] - Preparing for Surgery Before surgery, you  can play an important role.  Because skin is not sterile, your skin needs to be as free of germs as possible.  You can reduce the number of germs on your skin by washing with CHG (chlorahexidine gluconate) soap before surgery.  CHG is an antiseptic cleaner which kills germs and bonds with the skin to continue killing germs even after washing. Please DO NOT use if you have an allergy to CHG or antibacterial soaps.  If your skin becomes reddened/irritated stop using the CHG and inform your nurse when you arrive at Short Stay. Do not shave (including legs and underarms) for at least 48 hours prior to the first CHG shower.  You may shave your face/neck.  Please follow these instructions carefully:  1.  Shower with CHG Soap the night before surgery and the  morning of surgery.  2.  If you choose to wash your hair, wash your hair first as usual with your normal  shampoo.  3.  After you shampoo, rinse your hair and body thoroughly to remove the shampoo.                             4.  Use CHG as you would any other liquid soap.  You can apply chg directly to the skin and wash.  Gently with a scrungie or clean washcloth.  5.  Apply the CHG Soap to your body ONLY FROM THE NECK DOWN.   Do   not use on face/ open                           Wound or open sores. Avoid contact with eyes, ears mouth and   genitals (private parts).                       Wash face,  Genitals (private parts) with your normal soap.             6.  Wash thoroughly, paying special attention to the area where your    surgery  will be performed.  7.  Thoroughly rinse your body with warm water from the neck down.  8.  DO NOT shower/wash with your normal soap after using and rinsing off the CHG Soap.                9.  Pat yourself dry with a clean towel.            10.  Wear clean pajamas.            11.  Place clean sheets on your bed the night of your first shower and do not  sleep with pets. Day of Surgery : Do not apply any  lotions/deodorants the morning of surgery.  Please wear clean clothes to the hospital/surgery center.  FAILURE TO FOLLOW THESE INSTRUCTIONS MAY RESULT IN THE CANCELLATION OF YOUR SURGERY  PATIENT SIGNATURE_________________________________  NURSE SIGNATURE__________________________________  ________________________________________________________________________   Adam Phenix  An incentive spirometer is a tool that can help keep your lungs clear and active. This tool measures how well you are filling your lungs with each breath. Taking long deep breaths may help reverse or decrease the chance of  developing breathing (pulmonary) problems (especially infection) following:  A long period of time when you are unable to move or be active. BEFORE THE PROCEDURE   If the spirometer includes an indicator to show your best effort, your nurse or respiratory therapist will set it to a desired goal.  If possible, sit up straight or lean slightly forward. Try not to slouch.  Hold the incentive spirometer in an upright position. INSTRUCTIONS FOR USE  1. Sit on the edge of your bed if possible, or sit up as far as you can in bed or on a chair. 2. Hold the incentive spirometer in an upright position. 3. Breathe out normally. 4. Place the mouthpiece in your mouth and seal your lips tightly around it. 5. Breathe in slowly and as deeply as possible, raising the piston or the ball toward the top of the column. 6. Hold your breath for 3-5 seconds or for as long as possible. Allow the piston or ball to fall to the bottom of the column. 7. Remove the mouthpiece from your mouth and breathe out normally. 8. Rest for a few seconds and repeat Steps 1 through 7 at least 10 times every 1-2 hours when you are awake. Take your time and take a few normal breaths between deep breaths. 9. The spirometer may include an indicator to show your best effort. Use the indicator as a goal to work toward during each  repetition. 10. After each set of 10 deep breaths, practice coughing to be sure your lungs are clear. If you have an incision (the cut made at the time of surgery), support your incision when coughing by placing a pillow or rolled up towels firmly against it. Once you are able to get out of bed, walk around indoors and cough well. You may stop using the incentive spirometer when instructed by your caregiver.  RISKS AND COMPLICATIONS  Take your time so you do not get dizzy or light-headed.  If you are in pain, you may need to take or ask for pain medication before doing incentive spirometry. It is harder to take a deep breath if you are having pain. AFTER USE  Rest and breathe slowly and easily.  It can be helpful to keep track of a log of your progress. Your caregiver can provide you with a simple table to help with this. If you are using the spirometer at home, follow these instructions: [place] IF:   You are having difficultly using the spirometer.  You have trouble using the spirometer as often as instructed.  Your pain medication is not giving enough relief while using the spirometer.  You develop fever of 100.5 F (38.1 C) or higher. SEEK IMMEDIATE MEDICAL CARE IF:   You cough up bloody sputum that had not been present before.  You develop fever of 102 F (38.9 C) or greater.  You develop worsening pain at or near the incision site. MAKE SURE YOU:   Understand these instructions.  Will watch your condition.  Will get help right away if you are not doing well or get worse. Document Released: 09/18/2006 Document Revised: 07/31/2011 Document Reviewed: 11/19/2006 ExitCare Patient Information 2014 ExitCare, [place].   ________________________________________________________________________  WHAT IS A BLOOD TRANSFUSION? Blood Transfusion Information  A transfusion is the replacement of blood or some of its parts. Blood is made up of multiple cells which provide  different functions.  Red blood cells carry oxygen and are used for blood loss replacement.  White blood cells  fight against infection.  Platelets control bleeding.  Plasma helps clot blood.  Other blood products are available for specialized needs, such as hemophilia or other clotting disorders. BEFORE THE TRANSFUSION  Who gives blood for transfusions?   Healthy volunteers who are fully evaluated to make sure their blood is safe. This is blood bank blood. Transfusion therapy is the safest it has ever been in the practice of medicine. Before blood is taken from a donor, a complete history is taken to make sure that person has no history of diseases nor engages in risky social behavior (examples are intravenous drug use or sexual activity with multiple partners). The donor's travel history is screened to minimize risk of transmitting infections, such as malaria. The donated blood is tested for signs of infectious diseases, such as HIV and hepatitis. The blood is then tested to be sure it is compatible with you in order to minimize the chance of a transfusion reaction. If you or a relative donates blood, this is often done in anticipation of surgery and is not appropriate for emergency situations. It takes many days to process the donated blood. RISKS AND COMPLICATIONS Although transfusion therapy is very safe and saves many lives, the main dangers of transfusion include:   Getting an infectious disease.  Developing a transfusion reaction. This is an allergic reaction to something in the blood you were given. Every precaution is taken to prevent this. The decision to have a blood transfusion has been considered carefully by your caregiver before blood is given. Blood is not given unless the benefits outweigh the risks. AFTER THE TRANSFUSION  Right after receiving a blood transfusion, you will usually feel much better and more energetic. This is especially true if your red blood cells have  gotten low (anemic). The transfusion raises the level of the red blood cells which carry oxygen, and this usually causes an energy increase.  The nurse administering the transfusion will monitor you carefully for complications. HOME CARE INSTRUCTIONS  No special instructions are needed after a transfusion. You may find your energy is better. Speak with your caregiver about any limitations on activity for underlying diseases you may have. SEEK MEDICAL CARE IF:   Your condition is not improving after your transfusion.  You develop redness or irritation at the intravenous (IV) site. SEEK IMMEDIATE MEDICAL CARE IF:  Any of the following symptoms occur over the next 12 hours:  Shaking chills.  You have a temperature by mouth above 102 F (38.9 C), not controlled by medicine.  Chest, back, or muscle pain.  People around you feel you are not acting correctly or are confused.  Shortness of breath or difficulty breathing.  Dizziness and fainting.  You get a rash or develop hives.  You have a decrease in urine output.  Your urine turns a dark color or changes to pink, red, or brown. Any of the following symptoms occur over the next 10 days:  You have a temperature by mouth above 102 F (38.9 C), not controlled by medicine.  Shortness of breath.  Weakness after normal activity.  The white part of the eye turns yellow (jaundice).  You have a decrease in the amount of urine or are urinating less often.  Your urine turns a dark color or changes to pink, red, or brown. Document Released: 05/05/2000 Document Revised: 07/31/2011 Document Reviewed: 12/23/2007 [facility] Patient Information 2014 [place], [place].  _______________________________________________________________________

## 2020-09-17 NOTE — Progress Notes (Signed)
Follow-up Note  RE: Mason [name] MRN: [id number] DOB: 1945/09/01 Date of Office Visit: 09/17/2020   History of present illness: Mason [name] is a 75 y.o. male presenting today for sick visit.  He has history of asthma.  He was last seen in the office on 07/23/20 by myself.  He states he has been more shortness of breath for the past week. He also reports a lot a wheezing and productive cough.  he is having nighttime awakenings and as well having trouble getting to sleep.  He states he has been trying to cut is grass and feels that is a trigger for this episode.  He states he does wear a mask when he cuts the grass.  But he is now thinking he needs to have someone else come out to do his lawn.   He is using Trelegy 1 puff once a day and states may use an additional dose.   He is using proair now about 3 times a day for symptom relief.   He did get approved for Xolair medication program and has his first dose last week.  He does not think he is a taking a antihistamines at this time.    Review of systems: Review of Systems  Constitutional: Negative.   HENT: Negative.   Eyes: Negative.   Respiratory: Positive for cough, shortness of breath and wheezing.   Cardiovascular: Negative.   Gastrointestinal: Negative.   Musculoskeletal: Negative.   Skin: Negative.   Neurological: Negative.     All other systems negative unless noted above in HPI  Past medical/social/surgical/family history have been reviewed and are unchanged unless specifically indicated below.  No changes  Medication List: Current Outpatient Medications  Medication Sig Dispense Refill  . albuterol (VENTOLIN HFA) 108 (90 Base) MCG/ACT inhaler Inhale 1-2 puffs into the lungs every 6 (six) hours as needed for wheezing or shortness of breath. 18 g 5  . Aspirin-Caffeine (BAYER BACK & BODY PAIN EX ST) 500-32.5 MG TABS Take 2 tablets by mouth daily as needed (pain).    . Fluticasone-Umeclidin-Vilant (TRELEGY ELLIPTA)  200-62.5-25 MCG/INH AEPB Inhale 1 puff into the lungs daily. 30 each 5   Current Facility-Administered Medications  Medication Dose Route Frequency Provider Last Rate Last Admin  . omalizumab Arvid Right) prefilled syringe 300 mg  300 mg Subcutaneous Q28 days Kennith Gain, MD   300 mg at 09/10/20 1539     Known medication allergies: No Known Allergies   Physical examination: Blood pressure 98/78, pulse 80, resp. rate 18, SpO2 92 %.  General: Alert, interactive, in no acute distress. HEENT: PERRLA, TMs pearly gray, turbinates non-edematous without discharge, post-pharynx non erythematous. Neck: Supple without lymphadenopathy. Lungs: Mildly decreased breath sounds with expiratory wheezing bilaterally. {no increased work of breathing. CV: Normal S1, S2 without murmurs. Abdomen: Nondistended, nontender. Skin: Warm and dry, without lesions or rashes. Extremities:  No clubbing, cyanosis or edema. Neuro:   Grossly intact.  Diagnositics/Labs:  Spirometry: FEV1: 1.00L 37%, FVC: 1.71L 48% predicted. S/p duoneb had a 10% decrease FEV1 to 1.11 L or 41% predicted.  Assessment and plan:   1. Severe persistent asthma with exacerbation - Lung testing is very low and we gave you nebulizer treatment in the office - Continue Xolair injections every month.  Your next scheduled on 10/08/2020 - use Spacer device with albuterol (proair) - take prednisone 2 tab twice a day for 3 days, then 2 tab once a day for 1 day then 1 tab once a day  for 1 day and stop.  This will help you to breathe better - Daily controller medication(s): Trelegy 200/62.5/25 1 puff once daily   - Prior to physical activity: albuterol (ProAir) 2 puffs 10-15 minutes before physical activity. - Rescue medications: albuterol (ProAir) 4 puffs every 4-6 hours as needed - Asthma control goals:  * Full participation in all desired activities (may need albuterol before activity) * Albuterol use two time or less a week on  average (not counting use with activity) * Cough interfering with sleep two time or less a month * Oral steroids no more than once a year * No hospitalizations  2. Chronic rhinitis (dust mite, cat, dog, grass, trees, and ragweed) - Can take as needed: Xyzal (levocetirizine) 5mg  tablet once daily - You can use an extra dose of the antihistamine, if needed, for breakthrough symptoms.  - Consider nasal saline rinses 1-2 times daily to remove allergens from the nasal cavities as well as help with mucous clearance (this is especially helpful to do before the nasal sprays are given)  Follow-up in 2 months with Dr. Ernst Bowler  I appreciate the opportunity to take part in Mason [name]'s care. Please do not hesitate to contact me with questions.  Sincerely,   Prudy Feeler, MD Allergy/Immunology [facility] [place] [facility] [place]

## 2020-09-21 ENCOUNTER — Other Ambulatory Visit: Payer: Self-pay

## 2020-09-21 ENCOUNTER — Encounter (HOSPITAL_COMMUNITY)
Admission: RE | Admit: 2020-09-21 | Discharge: 2020-09-21 | Disposition: A | Discharge status: Home / Self Care | Payer: Medicare Other | Source: Ambulatory Visit | Attending: Surgery | Admitting: Surgery

## 2020-09-21 ENCOUNTER — Encounter (HOSPITAL_COMMUNITY): Payer: Self-pay

## 2020-09-21 DIAGNOSIS — Z01812 Encounter for preprocedural laboratory examination: Secondary | ICD-10-CM | POA: Diagnosis not present

## 2020-09-21 HISTORY — DX: Other specified diseases of intestine: K63.89

## 2020-09-21 LAB — BASIC METABOLIC PANEL
Anion gap: 7 (ref 5–15)
BUN: 15 mg/dL (ref 8–23)
CO2: 27 mmol/L (ref 22–32)
Calcium: 9.3 mg/dL (ref 8.9–10.3)
Chloride: 108 mmol/L (ref 98–111)
Creatinine, Ser: 1.12 mg/dL (ref 0.61–1.24)
GFR, Estimated: >60 mL/min (ref >60)
Glucose, Bld: 97 mg/dL (ref 70–99)
Potassium: 3.9 mmol/L (ref 3.5–5.1)
Sodium: 142 mmol/L (ref 135–145)

## 2020-09-21 LAB — CBC
HCT: 45.4 % (ref 39.0–52.0)
Hemoglobin: 15.3 g/dL (ref 13.0–17.0)
MCH: 33.9 pg (ref 26.0–34.0)
MCHC: 33.7 g/dL (ref 30.0–36.0)
MCV: 100.7 fL — ABNORMAL HIGH (ref 80.0–100.0)
Platelets: 173 10*3/uL (ref 150–400)
RBC: 4.51 MIL/uL (ref 4.22–5.81)
RDW: 13 % (ref 11.5–15.5)
WBC: 5.3 10*3/uL (ref 4.0–10.5)
nRBC: 0 % (ref 0.0–0.2)

## 2020-09-21 LAB — HEMOGLOBIN A1C
Hgb A1c MFr Bld: 4.8 % (ref 4.8–5.6)
Mean Plasma Glucose: 91.06 mg/dL

## 2020-09-27 ENCOUNTER — Other Ambulatory Visit (HOSPITAL_COMMUNITY)
Admission: RE | Admit: 2020-09-27 | Discharge: 2020-09-27 | Disposition: A | Discharge status: Home / Self Care | Payer: Medicare Other | Source: Ambulatory Visit | Attending: Surgery | Admitting: Surgery

## 2020-09-27 DIAGNOSIS — D126 Benign neoplasm of colon, unspecified: Secondary | ICD-10-CM | POA: Diagnosis not present

## 2020-09-27 DIAGNOSIS — R972 Elevated prostate specific antigen [PSA]: Secondary | ICD-10-CM | POA: Diagnosis present

## 2020-09-27 DIAGNOSIS — Z20822 Contact with and (suspected) exposure to covid-19: Secondary | ICD-10-CM | POA: Insufficient documentation

## 2020-09-27 DIAGNOSIS — D122 Benign neoplasm of ascending colon: Secondary | ICD-10-CM | POA: Diagnosis not present

## 2020-09-27 DIAGNOSIS — K621 Rectal polyp: Secondary | ICD-10-CM | POA: Diagnosis not present

## 2020-09-27 DIAGNOSIS — Z825 Family history of asthma and other chronic lower respiratory diseases: Secondary | ICD-10-CM | POA: Diagnosis not present

## 2020-09-27 DIAGNOSIS — K4020 Bilateral inguinal hernia, without obstruction or gangrene, not specified as recurrent: Secondary | ICD-10-CM | POA: Diagnosis not present

## 2020-09-27 DIAGNOSIS — Z803 Family history of malignant neoplasm of breast: Secondary | ICD-10-CM | POA: Diagnosis not present

## 2020-09-27 DIAGNOSIS — Z01812 Encounter for preprocedural laboratory examination: Secondary | ICD-10-CM | POA: Insufficient documentation

## 2020-09-27 DIAGNOSIS — K851 Biliary acute pancreatitis without necrosis or infection: Secondary | ICD-10-CM | POA: Diagnosis not present

## 2020-09-27 DIAGNOSIS — K6389 Other specified diseases of intestine: Secondary | ICD-10-CM | POA: Diagnosis not present

## 2020-09-27 DIAGNOSIS — Z7982 Long term (current) use of aspirin: Secondary | ICD-10-CM | POA: Diagnosis not present

## 2020-09-27 DIAGNOSIS — J4530 Mild persistent asthma, uncomplicated: Secondary | ICD-10-CM | POA: Diagnosis not present

## 2020-09-27 DIAGNOSIS — K219 Gastro-esophageal reflux disease without esophagitis: Secondary | ICD-10-CM | POA: Diagnosis not present

## 2020-09-27 DIAGNOSIS — Z9049 Acquired absence of other specified parts of digestive tract: Secondary | ICD-10-CM | POA: Diagnosis not present

## 2020-09-27 DIAGNOSIS — N529 Male erectile dysfunction, unspecified: Secondary | ICD-10-CM | POA: Diagnosis present

## 2020-09-27 DIAGNOSIS — Z7951 Long term (current) use of inhaled steroids: Secondary | ICD-10-CM | POA: Diagnosis not present

## 2020-09-27 DIAGNOSIS — K625 Hemorrhage of anus and rectum: Secondary | ICD-10-CM | POA: Diagnosis not present

## 2020-09-27 DIAGNOSIS — Z833 Family history of diabetes mellitus: Secondary | ICD-10-CM | POA: Diagnosis not present

## 2020-09-27 DIAGNOSIS — E785 Hyperlipidemia, unspecified: Secondary | ICD-10-CM | POA: Diagnosis not present

## 2020-09-27 DIAGNOSIS — E440 Moderate protein-calorie malnutrition: Secondary | ICD-10-CM | POA: Diagnosis not present

## 2020-09-27 LAB — SARS CORONAVIRUS 2 (TAT 6-24 HRS): SARS Coronavirus 2: NEGATIVE

## 2020-09-28 ENCOUNTER — Encounter (HOSPITAL_COMMUNITY): Payer: Self-pay | Admitting: Surgery

## 2020-09-28 ENCOUNTER — Ambulatory Visit: Payer: Medicare Other | Admitting: Allergy & Immunology

## 2020-09-28 MED ORDER — BUPIVACAINE LIPOSOME 1.3 % IJ SUSP
20.0000 mL | Freq: Once | INTRAMUSCULAR | Status: DC
  Filled 2020-09-28: qty 20

## 2020-09-28 NOTE — Progress Notes (Signed)
Called patient and reached his daughter. She is informed of time change for surgery. Pt to arrive 1000 for 1230 surgery. She verbalizes understanding.

## 2020-09-29 ENCOUNTER — Encounter (HOSPITAL_COMMUNITY): Payer: Self-pay | Admitting: Surgery

## 2020-09-29 ENCOUNTER — Inpatient Hospital Stay (HOSPITAL_COMMUNITY)
Admission: RE | Admit: 2020-09-29 | Discharge: 2020-10-01 | DRG: 330 | Disposition: A | Discharge status: Home / Self Care | Payer: Medicare Other | Attending: Surgery | Admitting: Surgery

## 2020-09-29 ENCOUNTER — Encounter (HOSPITAL_COMMUNITY)
Admission: RE | Disposition: A | Discharge status: Home / Self Care | Payer: Self-pay | Source: Ambulatory Visit | Attending: Surgery

## 2020-09-29 ENCOUNTER — Inpatient Hospital Stay (HOSPITAL_COMMUNITY): Payer: Medicare Other | Admitting: Anesthesiology

## 2020-09-29 ENCOUNTER — Other Ambulatory Visit: Payer: Self-pay

## 2020-09-29 ENCOUNTER — Inpatient Hospital Stay (HOSPITAL_COMMUNITY): Payer: Medicare Other | Admitting: Physician Assistant

## 2020-09-29 DIAGNOSIS — I82891 Chronic embolism and thrombosis of other specified veins: Secondary | ICD-10-CM | POA: Diagnosis present

## 2020-09-29 DIAGNOSIS — Z833 Family history of diabetes mellitus: Secondary | ICD-10-CM | POA: Diagnosis not present

## 2020-09-29 DIAGNOSIS — Z9049 Acquired absence of other specified parts of digestive tract: Secondary | ICD-10-CM | POA: Diagnosis not present

## 2020-09-29 DIAGNOSIS — K4020 Bilateral inguinal hernia, without obstruction or gangrene, not specified as recurrent: Secondary | ICD-10-CM | POA: Diagnosis present

## 2020-09-29 DIAGNOSIS — K621 Rectal polyp: Secondary | ICD-10-CM | POA: Diagnosis present

## 2020-09-29 DIAGNOSIS — D126 Benign neoplasm of colon, unspecified: Secondary | ICD-10-CM | POA: Diagnosis present

## 2020-09-29 DIAGNOSIS — J4530 Mild persistent asthma, uncomplicated: Secondary | ICD-10-CM | POA: Diagnosis present

## 2020-09-29 DIAGNOSIS — D649 Anemia, unspecified: Secondary | ICD-10-CM | POA: Diagnosis present

## 2020-09-29 DIAGNOSIS — Z7951 Long term (current) use of inhaled steroids: Secondary | ICD-10-CM | POA: Diagnosis not present

## 2020-09-29 DIAGNOSIS — Z20822 Contact with and (suspected) exposure to covid-19: Secondary | ICD-10-CM | POA: Diagnosis present

## 2020-09-29 DIAGNOSIS — E785 Hyperlipidemia, unspecified: Secondary | ICD-10-CM | POA: Diagnosis present

## 2020-09-29 DIAGNOSIS — Z825 Family history of asthma and other chronic lower respiratory diseases: Secondary | ICD-10-CM | POA: Diagnosis not present

## 2020-09-29 DIAGNOSIS — K6389 Other specified diseases of intestine: Secondary | ICD-10-CM | POA: Diagnosis present

## 2020-09-29 DIAGNOSIS — K219 Gastro-esophageal reflux disease without esophagitis: Secondary | ICD-10-CM | POA: Diagnosis present

## 2020-09-29 DIAGNOSIS — Z803 Family history of malignant neoplasm of breast: Secondary | ICD-10-CM | POA: Diagnosis not present

## 2020-09-29 DIAGNOSIS — K625 Hemorrhage of anus and rectum: Secondary | ICD-10-CM | POA: Diagnosis present

## 2020-09-29 DIAGNOSIS — N529 Male erectile dysfunction, unspecified: Secondary | ICD-10-CM | POA: Diagnosis present

## 2020-09-29 DIAGNOSIS — R972 Elevated prostate specific antigen [PSA]: Secondary | ICD-10-CM | POA: Diagnosis present

## 2020-09-29 DIAGNOSIS — Z7982 Long term (current) use of aspirin: Secondary | ICD-10-CM | POA: Diagnosis not present

## 2020-09-29 DIAGNOSIS — J45998 Other asthma: Secondary | ICD-10-CM | POA: Diagnosis present

## 2020-09-29 HISTORY — DX: Biliary acute pancreatitis without necrosis or infection: K85.10

## 2020-09-29 LAB — TYPE AND SCREEN
ABO/RH(D): O POS
Antibody Screen: NEGATIVE

## 2020-09-29 SURGERY — COLECTOMY, SIGMOID, ROBOT-ASSISTED
Anesthesia: General | Site: Abdomen

## 2020-09-29 MED ORDER — OMALIZUMAB 150 MG/ML ~~LOC~~ SOSY: 300.0000 mg | PREFILLED_SYRINGE | SUBCUTANEOUS | Status: DC

## 2020-09-29 MED ORDER — CHLORHEXIDINE GLUCONATE 0.12 % MT SOLN
15.0000 mL | Freq: Once | OROMUCOSAL | Status: AC
  Administered 2020-09-29: 15 mL via OROMUCOSAL

## 2020-09-29 MED ORDER — ASPIRIN-CAFFEINE 500-32.5 MG PO TABS: 2.0000 | ORAL_TABLET | Freq: Every day | ORAL | Status: DC | PRN

## 2020-09-29 MED ORDER — BUPIVACAINE-EPINEPHRINE (PF) 0.25% -1:200000 IJ SOLN
INTRAMUSCULAR | Status: AC
  Filled 2020-09-29: qty 60

## 2020-09-29 MED ORDER — ALBUTEROL SULFATE HFA 108 (90 BASE) MCG/ACT IN AERS
1.0000 | INHALATION_SPRAY | Freq: Four times a day (QID) | RESPIRATORY_TRACT | Status: DC | PRN
  Filled 2020-09-29: qty 6.7

## 2020-09-29 MED ORDER — SUGAMMADEX SODIUM 200 MG/2ML IV SOLN
INTRAVENOUS | Status: DC | PRN
  Administered 2020-09-29: 200 mg via INTRAVENOUS

## 2020-09-29 MED ORDER — ACETAMINOPHEN 500 MG PO TABS
1000.0000 mg | ORAL_TABLET | Freq: Four times a day (QID) | ORAL | Status: DC
  Administered 2020-09-29 – 2020-10-01 (×4): 1000 mg via ORAL
  Filled 2020-09-29 (×4): qty 2

## 2020-09-29 MED ORDER — OXYCODONE HCL 5 MG PO TABS: 5.0000 mg | ORAL_TABLET | Freq: Once | ORAL | Status: DC | PRN

## 2020-09-29 MED ORDER — EPHEDRINE SULFATE-NACL 50-0.9 MG/10ML-% IV SOSY
PREFILLED_SYRINGE | INTRAVENOUS | Status: DC | PRN
  Administered 2020-09-29: 15 mg via INTRAVENOUS

## 2020-09-29 MED ORDER — ACETAMINOPHEN 10 MG/ML IV SOLN: 1000.0000 mg | Freq: Once | INTRAVENOUS | Status: DC | PRN

## 2020-09-29 MED ORDER — PROCHLORPERAZINE MALEATE 10 MG PO TABS
10.0000 mg | ORAL_TABLET | Freq: Four times a day (QID) | ORAL | Status: DC | PRN
  Filled 2020-09-29: qty 1

## 2020-09-29 MED ORDER — DIPHENHYDRAMINE HCL 50 MG/ML IJ SOLN: 12.5000 mg | Freq: Four times a day (QID) | INTRAMUSCULAR | Status: DC | PRN

## 2020-09-29 MED ORDER — FENTANYL CITRATE (PF) 250 MCG/5ML IJ SOLN
INTRAMUSCULAR | Status: AC
  Filled 2020-09-29: qty 5

## 2020-09-29 MED ORDER — NEOMYCIN SULFATE 500 MG PO TABS: 1000.0000 mg | ORAL_TABLET | ORAL | Status: DC

## 2020-09-29 MED ORDER — TRAMADOL HCL 50 MG PO TABS
50.0000 mg | ORAL_TABLET | Freq: Four times a day (QID) | ORAL | Status: DC | PRN
Start: 2020-09-29 — End: 2020-10-01
  Administered 2020-09-30 – 2020-10-01 (×3): 100 mg via ORAL
  Filled 2020-09-29 (×3): qty 2

## 2020-09-29 MED ORDER — ALBUMIN HUMAN 5 % IV SOLN
INTRAVENOUS | Status: AC
  Filled 2020-09-29: qty 250

## 2020-09-29 MED ORDER — FENTANYL CITRATE (PF) 100 MCG/2ML IJ SOLN
INTRAMUSCULAR | Status: DC | PRN
  Administered 2020-09-29: 100 ug via INTRAVENOUS
  Administered 2020-09-29 (×3): 50 ug via INTRAVENOUS

## 2020-09-29 MED ORDER — EPHEDRINE 5 MG/ML INJ
INTRAVENOUS | Status: AC
  Filled 2020-09-29: qty 10

## 2020-09-29 MED ORDER — LACTATED RINGERS IR SOLN
Status: DC | PRN
  Administered 2020-09-29: 1000 mL

## 2020-09-29 MED ORDER — PHENYLEPHRINE 40 MCG/ML (10ML) SYRINGE FOR IV PUSH (FOR BLOOD PRESSURE SUPPORT)
PREFILLED_SYRINGE | INTRAVENOUS | Status: DC | PRN
  Administered 2020-09-29: 80 ug via INTRAVENOUS

## 2020-09-29 MED ORDER — CALCIUM POLYCARBOPHIL 625 MG PO TABS
625.0000 mg | ORAL_TABLET | Freq: Two times a day (BID) | ORAL | Status: DC
  Administered 2020-09-29 – 2020-10-01 (×4): 625 mg via ORAL
  Filled 2020-09-29 (×4): qty 1

## 2020-09-29 MED ORDER — MELATONIN 3 MG PO TABS
3.0000 mg | ORAL_TABLET | Freq: Every evening | ORAL | Status: DC | PRN
  Administered 2020-09-30: 3 mg via ORAL
  Filled 2020-09-29: qty 1

## 2020-09-29 MED ORDER — ENSURE SURGERY PO LIQD
237.0000 mL | Freq: Two times a day (BID) | ORAL | Status: DC
  Administered 2020-09-30 – 2020-10-01 (×2): 237 mL via ORAL

## 2020-09-29 MED ORDER — ENALAPRILAT 1.25 MG/ML IV SOLN
0.6250 mg | Freq: Four times a day (QID) | INTRAVENOUS | Status: DC | PRN
  Filled 2020-09-29: qty 1

## 2020-09-29 MED ORDER — ORAL CARE MOUTH RINSE: 15.0000 mL | Freq: Once | OROMUCOSAL | Status: AC

## 2020-09-29 MED ORDER — ALVIMOPAN 12 MG PO CAPS
12.0000 mg | ORAL_CAPSULE | Freq: Two times a day (BID) | ORAL | Status: DC
  Administered 2020-09-30: 12 mg via ORAL
  Filled 2020-09-29 (×2): qty 1

## 2020-09-29 MED ORDER — PHENYLEPHRINE HCL-NACL 10-0.9 MG/250ML-% IV SOLN
INTRAVENOUS | Status: DC | PRN
  Administered 2020-09-29: 40 ug/min via INTRAVENOUS

## 2020-09-29 MED ORDER — LIDOCAINE HCL 2 % IJ SOLN
INTRAMUSCULAR | Status: AC
  Filled 2020-09-29: qty 20

## 2020-09-29 MED ORDER — ACETAMINOPHEN 500 MG PO TABS
1000.0000 mg | ORAL_TABLET | ORAL | Status: AC
  Administered 2020-09-29: 1000 mg via ORAL
  Filled 2020-09-29: qty 2

## 2020-09-29 MED ORDER — ACETAMINOPHEN 325 MG PO TABS: 325.0000 mg | ORAL_TABLET | ORAL | Status: DC | PRN

## 2020-09-29 MED ORDER — PROPOFOL 10 MG/ML IV BOLUS
INTRAVENOUS | Status: DC | PRN
  Administered 2020-09-29: 100 mg via INTRAVENOUS

## 2020-09-29 MED ORDER — ALUM & MAG HYDROXIDE-SIMETH 200-200-20 MG/5ML PO SUSP: 30.0000 mL | Freq: Four times a day (QID) | ORAL | Status: DC | PRN

## 2020-09-29 MED ORDER — LACTATED RINGERS IV SOLN: INTRAVENOUS | Status: DC | PRN

## 2020-09-29 MED ORDER — BUPIVACAINE LIPOSOME 1.3 % IJ SUSP
INTRAMUSCULAR | Status: DC | PRN
  Administered 2020-09-29: 20 mL

## 2020-09-29 MED ORDER — EPINEPHRINE 0.3 MG/0.3ML IJ SOAJ: 0.3000 mg | Freq: Once | INTRAMUSCULAR | Status: DC

## 2020-09-29 MED ORDER — BISACODYL 5 MG PO TBEC: 20.0000 mg | DELAYED_RELEASE_TABLET | Freq: Once | ORAL | Status: DC

## 2020-09-29 MED ORDER — ROCURONIUM BROMIDE 10 MG/ML (PF) SYRINGE
PREFILLED_SYRINGE | INTRAVENOUS | Status: DC | PRN
  Administered 2020-09-29 (×2): 20 mg via INTRAVENOUS
  Administered 2020-09-29: 60 mg via INTRAVENOUS

## 2020-09-29 MED ORDER — PROMETHAZINE HCL 25 MG/ML IJ SOLN: 6.2500 mg | INTRAMUSCULAR | Status: DC | PRN

## 2020-09-29 MED ORDER — POLYETHYLENE GLYCOL 3350 17 GM/SCOOP PO POWD: 1.0000 | Freq: Once | ORAL | Status: DC

## 2020-09-29 MED ORDER — METHOCARBAMOL 1000 MG/10ML IJ SOLN
1000.0000 mg | Freq: Four times a day (QID) | INTRAVENOUS | Status: DC | PRN
  Filled 2020-09-29: qty 10

## 2020-09-29 MED ORDER — LIDOCAINE 2% (20 MG/ML) 5 ML SYRINGE
INTRAMUSCULAR | Status: DC | PRN
  Administered 2020-09-29: 1 mg/kg/h via INTRAVENOUS

## 2020-09-29 MED ORDER — ENOXAPARIN SODIUM 40 MG/0.4ML IJ SOSY
40.0000 mg | PREFILLED_SYRINGE | INTRAMUSCULAR | Status: DC
  Administered 2020-09-30 – 2020-10-01 (×2): 40 mg via SUBCUTANEOUS
  Filled 2020-09-29 (×2): qty 0.4

## 2020-09-29 MED ORDER — METOPROLOL TARTRATE 5 MG/5ML IV SOLN: 5.0000 mg | Freq: Four times a day (QID) | INTRAVENOUS | Status: DC | PRN

## 2020-09-29 MED ORDER — LIDOCAINE 2% (20 MG/ML) 5 ML SYRINGE
INTRAMUSCULAR | Status: DC | PRN
  Administered 2020-09-29: 80 mg via INTRAVENOUS

## 2020-09-29 MED ORDER — TRAMADOL HCL 50 MG PO TABS: 50.0000 mg | ORAL_TABLET | Freq: Four times a day (QID) | ORAL | 0 refills | Status: DC | PRN

## 2020-09-29 MED ORDER — HYDROMORPHONE HCL 1 MG/ML IJ SOLN
0.5000 mg | INTRAMUSCULAR | Status: DC | PRN
Start: 2020-09-29 — End: 2020-10-01

## 2020-09-29 MED ORDER — ALVIMOPAN 12 MG PO CAPS
12.0000 mg | ORAL_CAPSULE | ORAL | Status: AC
  Administered 2020-09-29: 12 mg via ORAL
  Filled 2020-09-29: qty 1

## 2020-09-29 MED ORDER — MIDAZOLAM HCL 5 MG/5ML IJ SOLN
INTRAMUSCULAR | Status: DC | PRN
  Administered 2020-09-29: 2 mg via INTRAVENOUS

## 2020-09-29 MED ORDER — GABAPENTIN 300 MG PO CAPS
300.0000 mg | ORAL_CAPSULE | ORAL | Status: AC
  Administered 2020-09-29: 300 mg via ORAL
  Filled 2020-09-29: qty 1

## 2020-09-29 MED ORDER — ENSURE PRE-SURGERY PO LIQD: 592.0000 mL | Freq: Once | ORAL | Status: DC

## 2020-09-29 MED ORDER — AMISULPRIDE (ANTIEMETIC) 5 MG/2ML IV SOLN: 10.0000 mg | Freq: Once | INTRAVENOUS | Status: DC | PRN

## 2020-09-29 MED ORDER — DIPHENHYDRAMINE HCL 12.5 MG/5ML PO ELIX: 12.5000 mg | ORAL_SOLUTION | Freq: Four times a day (QID) | ORAL | Status: DC | PRN

## 2020-09-29 MED ORDER — SODIUM CHLORIDE 0.9 % IV SOLN: Freq: Three times a day (TID) | INTRAVENOUS | Status: DC | PRN

## 2020-09-29 MED ORDER — ALBUTEROL SULFATE HFA 108 (90 BASE) MCG/ACT IN AERS
INHALATION_SPRAY | RESPIRATORY_TRACT | Status: DC | PRN
  Administered 2020-09-29: 3 via RESPIRATORY_TRACT

## 2020-09-29 MED ORDER — LIP MEDEX EX OINT
1.0000 "application " | TOPICAL_OINTMENT | Freq: Two times a day (BID) | CUTANEOUS | Status: DC
  Administered 2020-09-29 – 2020-10-01 (×4): 1 via TOPICAL
  Filled 2020-09-29 (×2): qty 7

## 2020-09-29 MED ORDER — MIDAZOLAM HCL 2 MG/2ML IJ SOLN
INTRAMUSCULAR | Status: AC
  Filled 2020-09-29: qty 2

## 2020-09-29 MED ORDER — LACTATED RINGERS IV SOLN: INTRAVENOUS | Status: DC

## 2020-09-29 MED ORDER — KETAMINE HCL 10 MG/ML IJ SOLN
INTRAMUSCULAR | Status: DC | PRN
  Administered 2020-09-29: 20 mg via INTRAVENOUS

## 2020-09-29 MED ORDER — PROCHLORPERAZINE EDISYLATE 10 MG/2ML IJ SOLN: 5.0000 mg | Freq: Four times a day (QID) | INTRAMUSCULAR | Status: DC | PRN

## 2020-09-29 MED ORDER — OXYCODONE HCL 5 MG/5ML PO SOLN
5.0000 mg | Freq: Once | ORAL | Status: DC | PRN
Start: 2020-09-29 — End: 2020-09-29

## 2020-09-29 MED ORDER — ONDANSETRON HCL 4 MG/2ML IJ SOLN
INTRAMUSCULAR | Status: DC | PRN
  Administered 2020-09-29: 4 mg via INTRAVENOUS

## 2020-09-29 MED ORDER — ALBUTEROL SULFATE HFA 108 (90 BASE) MCG/ACT IN AERS
INHALATION_SPRAY | RESPIRATORY_TRACT | Status: AC
  Filled 2020-09-29: qty 6.7

## 2020-09-29 MED ORDER — ONDANSETRON HCL 4 MG PO TABS: 4.0000 mg | ORAL_TABLET | Freq: Four times a day (QID) | ORAL | Status: DC | PRN

## 2020-09-29 MED ORDER — 0.9 % SODIUM CHLORIDE (POUR BTL) OPTIME
TOPICAL | Status: DC | PRN
  Administered 2020-09-29: 2000 mL

## 2020-09-29 MED ORDER — ONDANSETRON HCL 4 MG/2ML IJ SOLN: 4.0000 mg | Freq: Four times a day (QID) | INTRAMUSCULAR | Status: DC | PRN

## 2020-09-29 MED ORDER — BUPIVACAINE-EPINEPHRINE (PF) 0.25% -1:200000 IJ SOLN
INTRAMUSCULAR | Status: DC | PRN
  Administered 2020-09-29: 60 mL

## 2020-09-29 MED ORDER — SODIUM CHLORIDE 0.9 % IV SOLN
2.0000 g | INTRAVENOUS | Status: AC
  Administered 2020-09-29: 2 g via INTRAVENOUS
  Filled 2020-09-29: qty 2

## 2020-09-29 MED ORDER — ENSURE PRE-SURGERY PO LIQD: 296.0000 mL | Freq: Once | ORAL | Status: DC

## 2020-09-29 MED ORDER — ALBUMIN HUMAN 5 % IV SOLN: INTRAVENOUS | Status: DC | PRN

## 2020-09-29 MED ORDER — MAGIC MOUTHWASH
15.0000 mL | Freq: Four times a day (QID) | ORAL | Status: DC | PRN
  Filled 2020-09-29: qty 15

## 2020-09-29 MED ORDER — DEXAMETHASONE SODIUM PHOSPHATE 10 MG/ML IJ SOLN
INTRAMUSCULAR | Status: DC | PRN
  Administered 2020-09-29: 8 mg via INTRAVENOUS

## 2020-09-29 MED ORDER — FENTANYL CITRATE (PF) 100 MCG/2ML IJ SOLN: 25.0000 ug | INTRAMUSCULAR | Status: DC | PRN

## 2020-09-29 MED ORDER — SODIUM CHLORIDE 0.9 % IV SOLN
2.0000 g | Freq: Two times a day (BID) | INTRAVENOUS | Status: AC
  Administered 2020-09-30: 2 g via INTRAVENOUS
  Filled 2020-09-29: qty 2

## 2020-09-29 MED ORDER — METRONIDAZOLE 500 MG PO TABS: 1000.0000 mg | ORAL_TABLET | ORAL | Status: DC

## 2020-09-29 MED ORDER — ENOXAPARIN SODIUM 40 MG/0.4ML IJ SOSY
40.0000 mg | PREFILLED_SYRINGE | Freq: Once | INTRAMUSCULAR | Status: AC
  Administered 2020-09-29: 40 mg via SUBCUTANEOUS
  Filled 2020-09-29: qty 0.4

## 2020-09-29 MED ORDER — SIMETHICONE 80 MG PO CHEW: 40.0000 mg | CHEWABLE_TABLET | Freq: Four times a day (QID) | ORAL | Status: DC | PRN

## 2020-09-29 MED ORDER — ACETAMINOPHEN 160 MG/5ML PO SOLN: 325.0000 mg | ORAL | Status: DC | PRN

## 2020-09-29 SURGICAL SUPPLY — 108 items
APPLIER CLIP 5 13 M/L LIGAMAX5 (MISCELLANEOUS) IMPLANT
APPLIER CLIP ROT 10 11.4 M/L (STAPLE) IMPLANT
BLADE EXTENDED COATED 6.5IN (ELECTRODE) IMPLANT
CANNULA REDUC XI 12-8 STAPL (CANNULA)
CANNULA REDUCER 12-8 DVNC XI (CANNULA) IMPLANT
CELLS DAT CNTRL 66122 CELL SVR (MISCELLANEOUS) IMPLANT
CHLORAPREP W/TINT 26 (MISCELLANEOUS) IMPLANT
CLIP APPLIE 5 13 M/L LIGAMAX5 (MISCELLANEOUS) IMPLANT
CLIP APPLIE ROT 10 11.4 M/L (STAPLE) IMPLANT
COVER SURGICAL LIGHT HANDLE (MISCELLANEOUS) ×4 IMPLANT
COVER TIP SHEARS 8 DVNC (MISCELLANEOUS) ×1 IMPLANT
COVER TIP SHEARS 8MM DA VINCI (MISCELLANEOUS) ×1
COVER WAND RF STERILE (DRAPES) ×2 IMPLANT
DECANTER SPIKE VIAL GLASS SM (MISCELLANEOUS) ×2 IMPLANT
DEVICE TROCAR PUNCTURE CLOSURE (ENDOMECHANICALS) IMPLANT
DRAIN CHANNEL 19F RND (DRAIN) IMPLANT
DRAPE ARM DVNC X/XI (DISPOSABLE) ×4 IMPLANT
DRAPE COLUMN DVNC XI (DISPOSABLE) ×1 IMPLANT
DRAPE DA VINCI XI ARM (DISPOSABLE) ×4
DRAPE DA VINCI XI COLUMN (DISPOSABLE) ×1
DRAPE SURG IRRIG POUCH 19X23 (DRAPES) ×2 IMPLANT
DRSG OPSITE POSTOP 3X4 (GAUZE/BANDAGES/DRESSINGS) ×2 IMPLANT
DRSG OPSITE POSTOP 4X10 (GAUZE/BANDAGES/DRESSINGS) IMPLANT
DRSG OPSITE POSTOP 4X6 (GAUZE/BANDAGES/DRESSINGS) IMPLANT
DRSG OPSITE POSTOP 4X8 (GAUZE/BANDAGES/DRESSINGS) IMPLANT
DRSG TEGADERM 2-3/8X2-3/4 SM (GAUZE/BANDAGES/DRESSINGS) ×10 IMPLANT
DRSG TEGADERM 4X4.75 (GAUZE/BANDAGES/DRESSINGS) IMPLANT
ELECT PENCIL ROCKER SW 15FT (MISCELLANEOUS) ×2 IMPLANT
ELECT REM PT RETURN 15FT ADLT (MISCELLANEOUS) ×2 IMPLANT
ENDOLOOP SUT PDS II  0 18 (SUTURE)
ENDOLOOP SUT PDS II 0 18 (SUTURE) IMPLANT
EVACUATOR SILICONE 100CC (DRAIN) IMPLANT
GAUZE SPONGE 2X2 8PLY STRL LF (GAUZE/BANDAGES/DRESSINGS) ×1 IMPLANT
GLOVE SURG LTX SZ8 (GLOVE) ×6 IMPLANT
GLOVE SURG UNDER LTX SZ8 (GLOVE) ×6 IMPLANT
GOWN STRL REUS W/TWL XL LVL3 (GOWN DISPOSABLE) ×6 IMPLANT
GRASPER SUT TROCAR 14GX15 (MISCELLANEOUS) IMPLANT
HOLDER FOLEY CATH W/STRAP (MISCELLANEOUS) ×2 IMPLANT
IRRIG SUCT STRYKERFLOW 2 WTIP (MISCELLANEOUS) ×2 IMPLANT
IRRIGATION SUCT STRKRFLW 2 WTP (MISCELLANEOUS) ×1 IMPLANT
KIT PROCEDURE DA VINCI SI (MISCELLANEOUS)
KIT PROCEDURE DVNC SI (MISCELLANEOUS) IMPLANT
KIT SIGMOIDOSCOPE (SET/KITS/TRAYS/PACK) IMPLANT
KIT TURNOVER KIT A (KITS) ×2 IMPLANT
NEEDLE INSUFFLATION 14GA 120MM (NEEDLE) ×2 IMPLANT
PACK CARDIOVASCULAR III (CUSTOM PROCEDURE TRAY) ×2 IMPLANT
PACK COLON (CUSTOM PROCEDURE TRAY) ×2 IMPLANT
PAD POSITIONING PINK XL (MISCELLANEOUS) ×2 IMPLANT
PORT LAP GEL ALEXIS MED 5-9CM (MISCELLANEOUS) ×2 IMPLANT
PROTECTOR NERVE ULNAR (MISCELLANEOUS) ×4 IMPLANT
RELOAD STAPLER 2.5X60 WHT DVNC (STAPLE) ×1 IMPLANT
RELOAD STAPLER 3.5X45 BLU DVNC (STAPLE) IMPLANT
RELOAD STAPLER 3.5X60 BLU DVNC (STAPLE) ×2 IMPLANT
RELOAD STAPLER 4.3X45 GRN DVNC (STAPLE) IMPLANT
RELOAD STAPLER 4.3X60 GRN DVNC (STAPLE) IMPLANT
RTRCTR WOUND ALEXIS 18CM MED (MISCELLANEOUS) IMPLANT
SCISSORS LAP 5X35 DISP (ENDOMECHANICALS) ×2 IMPLANT
SEAL CANN UNIV 5-8 DVNC XI (MISCELLANEOUS) ×3 IMPLANT
SEAL XI 5MM-8MM UNIVERSAL (MISCELLANEOUS) ×3
SEALER VESSEL DA VINCI XI (MISCELLANEOUS) ×1
SEALER VESSEL EXT DVNC XI (MISCELLANEOUS) ×1 IMPLANT
SOLUTION ELECTROLUBE (MISCELLANEOUS) ×2 IMPLANT
SPONGE GAUZE 2X2 STER 10/PKG (GAUZE/BANDAGES/DRESSINGS) ×1
STAPLER 45 DA VINCI SURE FORM (STAPLE)
STAPLER 45 SUREFORM DVNC (STAPLE) IMPLANT
STAPLER 60 DA VINCI SURE FORM (STAPLE) ×1
STAPLER 60 SUREFORM DVNC (STAPLE) ×1 IMPLANT
STAPLER CANNULA SEAL DVNC XI (STAPLE) ×1 IMPLANT
STAPLER CANNULA SEAL XI (STAPLE) ×1
STAPLER ECHELON POWER CIR 29 (STAPLE) IMPLANT
STAPLER ECHELON POWER CIR 31 (STAPLE) IMPLANT
STAPLER RELOAD 2.5X60 WHITE (STAPLE) ×1
STAPLER RELOAD 2.5X60 WHT DVNC (STAPLE) ×1 IMPLANT
STAPLER RELOAD 3.5X45 BLU DVNC (STAPLE) IMPLANT
STAPLER RELOAD 3.5X45 BLUE (STAPLE)
STAPLER RELOAD 3.5X60 BLU DVNC (STAPLE) ×2 IMPLANT
STAPLER RELOAD 3.5X60 BLUE (STAPLE) ×2
STAPLER RELOAD 4.3X45 GREEN (STAPLE)
STAPLER RELOAD 4.3X45 GRN DVNC (STAPLE) IMPLANT
STAPLER RELOAD 4.3X60 GREEN (STAPLE)
STAPLER RELOAD 4.3X60 GRN DVNC (STAPLE) IMPLANT
STOPCOCK 4 WAY LG BORE MALE ST (IV SETS) ×4 IMPLANT
SURGILUBE 2OZ TUBE FLIPTOP (MISCELLANEOUS) IMPLANT
SUT MNCRL AB 4-0 PS2 18 (SUTURE) ×2 IMPLANT
SUT PDS AB 1 CT1 27 (SUTURE) ×4 IMPLANT
SUT PROLENE 0 CT 2 (SUTURE) IMPLANT
SUT PROLENE 2 0 KS (SUTURE) IMPLANT
SUT PROLENE 2 0 SH DA (SUTURE) IMPLANT
SUT SILK 2 0 (SUTURE)
SUT SILK 2 0 SH CR/8 (SUTURE) IMPLANT
SUT SILK 2-0 18XBRD TIE 12 (SUTURE) IMPLANT
SUT SILK 3 0 (SUTURE)
SUT SILK 3 0 SH CR/8 (SUTURE) ×2 IMPLANT
SUT SILK 3-0 18XBRD TIE 12 (SUTURE) IMPLANT
SUT V-LOC BARB 180 2/0GR6 GS22 (SUTURE) IMPLANT
SUT VIC AB 3-0 SH 18 (SUTURE) IMPLANT
SUT VIC AB 3-0 SH 27 (SUTURE)
SUT VIC AB 3-0 SH 27XBRD (SUTURE) IMPLANT
SUT VICRYL 0 UR6 27IN ABS (SUTURE) ×2 IMPLANT
SUTURE V-LC BRB 180 2/0GR6GS22 (SUTURE) IMPLANT
SYR 10ML ECCENTRIC (SYRINGE) ×2 IMPLANT
SYS LAPSCP GELPORT 120MM (MISCELLANEOUS) IMPLANT
SYSTEM LAPSCP GELPORT 120MM (MISCELLANEOUS) IMPLANT
TOWEL OR NON WOVEN STRL DISP B (DISPOSABLE) ×2 IMPLANT
TRAY FOLEY MTR SLVR 16FR STAT (SET/KITS/TRAYS/PACK) ×2 IMPLANT
TROCAR ADV FIXATION 5X100MM (TROCAR) ×2 IMPLANT
TUBING CONNECTING 10 (TUBING) ×4 IMPLANT
TUBING INSUFFLATION 10FT LAP (TUBING) ×2 IMPLANT

## 2020-09-29 NOTE — Interval H&P Note (Signed)
History and Physical Interval Note:  09/29/2020 1:14 PM  Mason [name]  has presented today for surgery, with the diagnosis of COLON POLYP UNRESECTABLE BY COLONOSCOPY.  The various methods of treatment have been discussed with the patient and family. After consideration of risks, benefits and other options for treatment, the patient has consented to  Procedure(s): ROBOTIC COLECTOMY-PROXIMAL (N/A) as a surgical intervention.  The patient's history has been reviewed, patient examined, no change in status, stable for surgery.  I have reviewed the patient's chart and labs.  Questions were answered to the patient's satisfaction.    I have re-reviewed the the patient's records, history, medications, and allergies.  I have re-examined the patient.  I again discussed intraoperative plans and goals of post-operative recovery.  The patient agrees to proceed.  Mason [name]  08-30-1945 [id number]  Patient Care Team: London Pepper, MD as PCP - General (Family Medicine) Wilford Corner, MD as Consulting Physician (Gastroenterology) Michael Boston, MD as Consulting Physician (General Surgery)  Patient Active Problem List   Diagnosis Date Noted   High grade dysplasia in colonic adenoma 07/27/2020   Chronic thrombosis of splenic vein after pancreatitis 2015 07/28/2014   GI bleed 04/14/2014   Anemia 04/14/2014   Malnutrition of moderate degree ([place]) 04/09/2014   Sepsis ([place]) 04/08/2014   Abnormal abdominal CT scan 04/07/2014   Right otitis media 04/02/2014   Fever 04/01/2014   Necrotizing pancreatitis 03/26/2014   Cholelithiasis 03/26/2014   History of alcohol use, denies any recent alcohol use 03/26/2014   Pancreatitis, gallstone    SOB (shortness of breath) 07/17/2013   Intermittent fever 07/17/2013   Seasonal asthma 07/17/2013   Fever, unspecified 07/11/2013   Constipation 07/11/2013   Cardiomegaly 07/10/2013   Abnormal ultrasound of abdomen 07/09/2013   Abdominal pain, acute, periumbilical  52/84/1324   Nausea & vomiting 07/08/2013   Overweight (BMI 25.0-29.9) 07/08/2013    Past Medical History:  Diagnosis Date   GERD (gastroesophageal reflux disease)    Gout attack 1990   Mass of colon    Pancreatitis, acute 07/08/2013   Seasonal allergies    Seasonal asthma    "related to allergies"    Past Surgical History:  Procedure Laterality Date   CHOLECYSTECTOMY N/A 03/29/2014   Procedure: LAPAROSCOPIC CHOLECYSTECTOMY WITH INTRAOPERATIVE CHOLANGIOGRAM;  Surgeon: Coralie Keens, MD;  Location: [facility];  Service: General;  Laterality: N/A;   CIRCUMCISION  ~ [place] AND RETINAL LASER Right 2014   "for tear"   PANCREAS SURGERY      Social History   Socioeconomic History   Marital status: Married    Spouse name: Not on file   Number of children: Not on file   Years of education: Not on file   Highest education level: Not on file  Occupational History   Not on file  Tobacco Use   Smoking status: Never Smoker   Smokeless tobacco: Never Used  Vaping Use   Vaping Use: Never used  Substance and Sexual Activity   Alcohol use: Yes    Comment: 2 beer a month   Drug use: No   Sexual activity: Yes  Other Topics Concern   Not on file  Social History Narrative   Not on file   Social Determinants of Health   Financial Resource Strain: Not on file  Food Insecurity: Not on file  Transportation Needs: Not on file  Physical Activity: Not on file  Stress: Not on file  Social Connections:  Not on file  Intimate Partner Violence: Not on file    History reviewed. No pertinent family history.  Facility-Administered Medications Prior to Admission  Medication Dose Route Frequency Provider Last Rate Last Admin   omalizumab Arvid Right) prefilled syringe 300 mg  300 mg Subcutaneous Q28 days Kennith Gain, MD   300 mg at 09/10/20 1539   Medications Prior to Admission  Medication Sig Dispense Refill Last Dose    albuterol (VENTOLIN HFA) 108 (90 Base) MCG/ACT inhaler Inhale 1-2 puffs into the lungs every 6 (six) hours as needed for wheezing or shortness of breath. 18 g 5 Past Week at Unknown time   Aspirin-Caffeine (BAYER BACK & BODY PAIN EX ST) 500-32.5 MG TABS Take 2 tablets by mouth daily as needed (pain).   Past Week at Unknown time   EPINEPHrine 0.3 mg/0.3 mL IJ SOAJ injection Use as directed for life-threatening allergic reactions- Xolair protocol 2 each 1    Fluticasone-Umeclidin-Vilant (TRELEGY ELLIPTA) 200-62.5-25 MCG/INH AEPB Inhale 1 puff into the lungs daily. 30 each 5 Not Taking at Unknown time    Current Facility-Administered Medications  Medication Dose Route Frequency Provider Last Rate Last Admin   bupivacaine liposome (EXPAREL) 1.3 % injection 266 mg  20 mL Infiltration Once Michael Boston, MD       cefoTEtan (CEFOTAN) 2 g in sodium chloride 0.9 % 100 mL IVPB  2 g Intravenous On Call to OR Michael Boston, MD       lactated ringers infusion   Intravenous Continuous Lynda Rainwater, MD 10 mL/hr at 09/29/20 1029 New Bag at 09/29/20 1029     No Known Allergies  BP 111/86   Pulse 80   Temp 98 F (36.7 C) (Oral)   Resp 18   Wt 82.1 kg   SpO2 93%   BMI 25.60 kg/m   Labs: No results found for this or any previous visit (from the past 48 hour(s)).  Imaging / Studies: No results found.   Adin Hector, M.D., F.A.C.S. Gastrointestinal and Minimally Invasive Surgery [facility] [address] [place], [place] [address] [phone] Main / Paging  09/29/2020 1:14 PM    Adin Hector

## 2020-09-29 NOTE — Op Note (Signed)
09/29/2020  4:49 PM  PATIENT:  Mason [name]  75 y.o. male  Patient Care Team: London Pepper, MD as PCP - General (Family Medicine) Wilford Corner, MD as Consulting Physician (Gastroenterology) Michael Boston, MD as Consulting Physician (General Surgery)  PRE-OPERATIVE DIAGNOSIS:  TRANSVERSE COLON MASS UNRESECTABLE BY COLONOSCOPY  POST-OPERATIVE DIAGNOSIS:  TRANSVERSE COLON MASS UNRESECTABLE BY COLONOSCOPY  PROCEDURE:   ROBOTIC PROXIMAL RIGHT COLECTOMY TRANSVERSUS ABDOMINIS PLANE (TAP) BLOCK - BILATERAL  SURGEON:  Adin Hector, MD  ASSISTANT: Nadeen Landau, MD An experienced assistant was required given the standard of surgical care given the complexity of the case.  This assistant was needed for exposure, dissection, suction, tissue approximation, retraction, perception, etc.  ANESTHESIA:     General  Nerve block provided with liposomal bupivacaine (Experel) mixed with 0.25% bupivacaine as a Bilateral TAP block x 94mL each side at the level of the transverse abdominis & preperitoneal spaces along the flank at the anterior axillary line, from subcostal ridge to iliac crest under laparoscopic guidance   Local field block at port sites & extraction wound  EBL:  Total I/O In: 2250 [I.V.:2000; IV Piggyback:250] Out: 150 [Urine:100; Blood:50]  Delay start of Pharmacological VTE agent (>24hrs) due to surgical blood loss or risk of bleeding:  no  DRAINS: none   SPECIMEN:  PROXIMAL "RIGHT" COLON  DISPOSITION OF SPECIMEN:  PATHOLOGY  COUNTS:  YES  PLAN OF CARE: Admit to inpatient   PATIENT DISPOSITION:  PACU - hemodynamically stable.  INDICATION:    Patient with history of gallstone pancreatitis has had intermittent rectal bleeding.  Underwent colonoscopy found to have mass in proximal transverse colon with high-grade dysplasia.  Surgical consultation requested over concerns of being able to be resected safely by endoscopy.  I recommended segmental resection:  The  anatomy & physiology of the digestive tract was discussed.  The pathophysiology was discussed.  Natural history risks without surgery was discussed.   I worked to give an overview of the disease and the frequent need to have multispecialty involvement.  I feel the risks of no intervention will lead to serious problems that outweigh the operative risks; therefore, I recommended a partial colectomy to remove the pathology.  Laparoscopic & open techniques were discussed.   Risks such as bleeding, infection, abscess, leak, reoperation, possible ostomy, hernia, heart attack, death, and other risks were discussed.  I noted a good likelihood this will help address the problem.   Goals of post-operative recovery were discussed as well.  We will work to minimize complications.  An educational handout on the pathology was given as well.  Questions were answered.    The patient expresses understanding & wishes to proceed with surgery.  OR FINDINGS:   CASE DATA:  Type of patient?: Elective [facility] Private Case  Status of Case? Elective Scheduled  Infection Present At Time Of Surgery (PATOS)?  NO  Patient had a spherical mass in the proximal transverse colon just distal to the hepatic flexure of the.  Just proximal to tattooing.  No obvious metastatic disease on visceral parietal peritoneum or liver.  It is an isoperistaltic ileocolonic anastomosis that rests in the epigastric region.  DESCRIPTION:   Informed consent was confirmed.  The patient underwent general anaesthesia without difficulty.  The patient was positioned with arms tucked & secured appropriately.  VTE prevention in place.  The patient's abdomen was clipped, prepped, & draped in a sterile fashion.  Surgical timeout confirmed our plan.  The patient was positioned in reverse Trendelenburg.  Abdominal entry was gained using Varess technique at the left subcostal ridge on the anterior abdominal wall.  No elevated EtCO2 noted.  Port placed.  Camera  inspection revealed no injury.  Extra ports were carefully placed under direct laparoscopic visualization.  We docked the [employer] robot carefully and placed intstruments under visualization  I mobilized & reflected the greater omentum and small bowel in the upper abdomen.   I was able to elevate the proximal colon to isolate the ileocolonic pedicle.  I scored the ileal mesentery just proximal to that.   I carried that further dissection in a medial to lateral fashion.  I was able to bluntly get into the retro-mesenteric plane on the right side.  I freed the proximal right sided colonic mesentery off the retroperitoneum including the duodenal sweep, pancreatic head, & Gerota's fascia of the right kidney. I was able to get underneath the hepatic flexure.  I was able to get underneath the proximal and mid transverse colon.  I isolated the proximal ileocecal pedicle.  I skeletonized it & transected the vessels.  I worked up freeing the proximal colon mesentery off the retroperitoneum.  It is very stretched out thinned tissues.  Some atypical planes and adhesions consistent with his prior gallstone pancreatitis and cholecystectomy.  I was able to free off the right kidney.  I did have to take a little bit of Gerota's fascia en bloc.  However that proved good mobility and can free the proximal transverse colon mesentery off the retroperitoneum and duodenal sweep and pancreatic head  I then proceeded to mobilize the terminal ileum & proximal "right" colon in a lateral to medial fashion.  I mobilized the distal ileal mesentery off its retroperitoneal and pelvic attachments.  I mobilized the ascending colon off It is side wall attachments to the paracolic gutter and retroperitoneum.  I also mobilized the greater omentum off the mid transverse colon and mobilized the mid to proximal transverse colon in a superior to inferior fashion.  I did take some thickened omentum near the tattooing with the specimen.  The  hepatic flexure of the proximal transverse colon had dense adhesions to the liver from his prior cholecystectomy and gallstone pancreatitis but we will do safely free.  This allowed me to mobilize the hepatic flexure and get a complete mobilization of the proximal "right" colon to the mid-transverse colon..  I could isolate the pathology. When he went ahead and proceeded with transection.  I transected the distal ileal mesentery and then transected at the distal ileum with a robotic stapler 33mm blue load.  I then transected transverse colon mesentery just proximal to a dominant middle colic arterial pedicle radially.  Transected at the proximal transverse colon with a robotic stapler.  We assured hemostasis.   I did a side-to-side stapled anastomosis of ileum to mid-transverse colon using a 64mm white load in an isoperistaltic fashion.  (Distal stump of ileum to mid transverse colon for the distal end of the anastomosis.  Proximal end of colon stump to more proximal ileum for the proximal end of the anastomosis).  I sewed the common staple channel wound with an absorbable suture (2-0 V-lock) in a running [place] fashion from each corner and meeting in the center.  I did meticulous inspection prove an airtight closure.  I protected the anastomosis line with an anterior omentopexy of greater omentum using V lock suture.  We did reinspection of the abdomen.  Hemostasis was good.   Ureters, retroperitoneum, and bowel uninjured.  The anastomosis looked healthy.   Endoluminal gas was evacuated.  We placed the wound protector through the suprapubic 35mm port site after it was enlarged in a Pfannenstiel fashion.  Specimen removed without incident.   Ports & wound protector removed.  Hemostasis was good.  Sterile unused instruments were used from this point.  I closed the skin at the port sites using Monocryl stitch and sterile dressing.  I closed the extraction wound using a 0 Vicryl vertical peritoneal closure and a  #1 PDS transverse anterior rectal fascial closure like a small Pfannenstiel closure. I closed the skin with some interrupted Monocryl stitches. I placed antibiotic-soaked wicks into the closure at the corners x2.  I placed sterile dressings.     Patient is being extubated go to recovery room. I discussed postop care with the patient in detail the office & in the holding area. Instructions are written. I discussed operative findings, updated the patient's status, discussed probable steps to recovery, and gave postoperative recommendations to the patient's daughter, Jeannetta Ellis.  Recommendations were made.  Questions were answered.  She expressed understanding & appreciation.   Adin Hector, M.D., F.A.C.S. Gastrointestinal and Minimally Invasive Surgery [facility] [address], [place], [place] [id number] [phone] Main / Paging

## 2020-09-29 NOTE — Discharge Instructions (Signed)
SURGERY: POST OP INSTRUCTIONS (Surgery for small bowel obstruction, colon resection, etc)   ######################################################################  EAT Gradually transition to a high fiber diet with a fiber supplement over the next few days after discharge  WALK Walk an hour a day.  Control your pain to do that.    CONTROL PAIN Control pain so that you can walk, sleep, tolerate sneezing/coughing, go up/down stairs.  HAVE A BOWEL MOVEMENT DAILY Keep your bowels regular to avoid problems.  OK to try a laxative to override constipation.  OK to use an antidairrheal to slow down diarrhea.  Call if not better after 2 tries  CALL IF YOU HAVE PROBLEMS/CONCERNS Call if you are still struggling despite following these instructions. Call if you have concerns not answered by these instructions  ######################################################################   DIET Follow a light diet the first few days at home.  Start with a bland diet such as soups, liquids, starchy foods, low fat foods, etc.  If you feel full, bloated, or constipated, stay on a ful liquid or pureed/blenderized diet for a few days until you feel better and no longer constipated. Be sure to drink plenty of fluids every day to avoid getting dehydrated (feeling dizzy, not urinating, etc.). Gradually add a fiber supplement to your diet over the next week.  Gradually get back to a regular solid diet.  Avoid fast food or heavy meals the first week as you are more likely to get nauseated. It is expected for your digestive tract to need a few months to get back to normal.  It is common for your bowel movements and stools to be irregular.  You will have occasional bloating and cramping that should eventually fade away.  Until you are eating solid food normally, off all pain medications, and back to regular activities; your bowels will not be normal. Focus on eating a low-fat, high fiber diet the rest of your life  (See Getting to [place], below).  CARE of your INCISION or WOUND It is good for closed incision and even open wounds to be washed every day.  Shower every day.  Short baths are fine.  Wash the incisions and wounds clean with soap & water.     If you have a closed incision(s), wash the incision with soap & water every day.  You may leave closed incisions open to air if it is dry.   You may cover the incision with clean gauze & replace it after your daily shower for comfort.  It is good for closed incisions and even open wounds to be washed every day.  Shower every day.  Short baths are fine.  Wash the incisions and wounds clean with soap & water.    You may leave closed incisions open to air if it is dry.   You may cover the incision with clean gauze & replace it after your daily shower for comfort.  TEGADERM:  You have clear gauze band-aid dressings over your closed incision(s).  Remove the dressings 3 days after surgery.  If you have an open wound with a wound vac, see wound vac care instructions.     ACTIVITIES as tolerated Start light daily activities --- self-care, walking, climbing stairs-- beginning the day after surgery.  Gradually increase activities as tolerated.  Control your pain to be active.  Stop when you are tired.  Ideally, walk several times a day, eventually an hour a day.   Most people are back to most day-to-day activities in a  few weeks.  It takes 4-8 weeks to get back to unrestricted, intense activity. If you can walk 30 minutes without difficulty, it is safe to try more intense activity such as jogging, treadmill, bicycling, low-impact aerobics, swimming, etc. Save the most intensive and strenuous activity for last (Usually 4-8 weeks after surgery) such as sit-ups, heavy lifting, contact sports, etc.  Refrain from any intense heavy lifting or straining until you are off narcotics for pain control.  You will have off days, but things should improve  week-by-week. DO NOT PUSH THROUGH PAIN.  Let pain be your guide: If it hurts to do something, don't do it.  Pain is your body warning you to avoid that activity for another week until the pain goes down. You may drive when you are no longer taking narcotic prescription pain medication, you can comfortably wear a seatbelt, and you can safely make sudden turns/stops to protect yourself without hesitating due to pain. You may have sexual intercourse when it is comfortable. If it hurts to do something, stop.  MEDICATIONS Take your usually prescribed home medications unless otherwise directed.   Blood thinners:  Usually you can restart any strong blood thinners after the second postoperative day.  It is OK to take aspirin right away.     If you are on strong blood thinners (warfarin/Coumadin, Plavix, Xerelto, Eliquis, Pradaxa, etc), discuss with your surgeon, medicine PCP, and/or cardiologist for instructions on when to restart the blood thinner & if blood monitoring is needed (PT/INR blood check, etc).     PAIN CONTROL Pain after surgery or related to activity is often due to strain/injury to muscle, tendon, nerves and/or incisions.  This pain is usually short-term and will improve in a few months.  To help speed the process of healing and to get back to regular activity more quickly, DO THE FOLLOWING THINGS TOGETHER: 1. Increase activity gradually.  DO NOT PUSH THROUGH PAIN 2. Use Ice and/or Heat 3. Try Gentle Massage and/or Stretching 4. Take over the counter pain medication 5. Take Narcotic prescription pain medication for more severe pain  Good pain control = faster recovery.  It is better to take more medicine to be more active than to stay in bed all day to avoid medications. 1.  Increase activity gradually Avoid heavy lifting at first, then increase to lifting as tolerated over the next 6 weeks. Do not "push through" the pain.  Listen to your body and avoid positions and maneuvers than  reproduce the pain.  Wait a few days before trying something more intense Walking an hour a day is encouraged to help your body recover faster and more safely.  Start slowly and stop when getting sore.  If you can walk 30 minutes without stopping or pain, you can try more intense activity (running, jogging, aerobics, cycling, swimming, treadmill, sex, sports, weightlifting, etc.) Remember: If it hurts to do it, then don't do it! 2. Use Ice and/or Heat You will have swelling and bruising around the incisions.  This will take several weeks to resolve. Ice packs or heating pads (6-8 times a day, 30-60 minutes at a time) will help sooth soreness & bruising. Some people prefer to use ice alone, heat alone, or alternate between ice & heat.  Experiment and see what works best for you.  Consider trying ice for the first few days to help decrease swelling and bruising; then, switch to heat to help relax sore spots and speed recovery. Shower every day.  Short baths  are fine.  It feels good!  Keep the incisions and wounds clean with soap & water.   3. Try Gentle Massage and/or Stretching Massage at the area of pain many times a day Stop if you feel pain - do not overdo it 4. Take over the counter pain medication This helps the muscle and nerve tissues become less irritable and calm down faster Choose ONE of the following over-the-counter anti-inflammatory medications: Acetaminophen 565m tabs (Tylenol) 1-2 pills with every meal and just before bedtime (avoid if you have liver problems or if you have acetaminophen in you narcotic prescription) Naproxen 2248mtabs (ex. Aleve, Naprosyn) 1-2 pills twice a day (avoid if you have kidney, stomach, IBD, or bleeding problems) Ibuprofen 20030mabs (ex. Advil, Motrin) 3-4 pills with every meal and just before bedtime (avoid if you have kidney, stomach, IBD, or bleeding problems) Take with food/snack several times a day as directed for at least 2 weeks to help keep pain /  soreness down & more manageable. 5. Take Narcotic prescription pain medication for more severe pain A prescription for strong pain control is often given to you upon discharge (for example: oxycodone/Percocet, hydrocodone/Norco/Vicodin, or tramadol/Ultram) Take your pain medication as prescribed. Be mindful that most narcotic prescriptions contain Tylenol (acetaminophen) as well - avoid taking too much Tylenol. If you are having problems/concerns with the prescription medicine (does not control pain, nausea, vomiting, rash, itching, etc.), please call us [phone] see if we need to switch you to a different pain medicine that will work better for you and/or control your side effects better. If you need a refill on your pain medication, you must call the office before 4 pm and on weekdays only.  By federal law, prescriptions for narcotics cannot be called into a pharmacy.  They must be filled out on paper & picked up from our office by the patient or authorized caretaker.  Prescriptions cannot be filled after 4 pm nor on weekends.    WHEN TO CALL US [phone] uncontrolled or worsening pain  Fever over 101 F (38.5 C) Concerns with the incision: Worsening pain, redness, rash/hives, swelling, bleeding, or drainage Reactions / problems with new medications (itching, rash, hives, nausea, etc.) Nausea and/or vomiting Difficulty urinating Difficulty breathing Worsening fatigue, dizziness, lightheadedness, blurred vision Other concerns If you are not getting better after two weeks or are noticing you are getting worse, contact our office [phone] for further advice.  We may need to adjust your medications, re-evaluate you in the office, send you to the emergency room, or see what other things we can do to help. The clinic staff is available to answer your questions during regular business hours (8:30am-5pm).  Please don't hesitate to call and ask to speak to one of our nurses for  clinical concerns.    A surgeon from [facility] is always on call at the hospitals 24 hours/day If you have a medical emergency, go to the nearest emergency room or call 911.  FOLLOW UP in our office One the day of your discharge from the hospital (or the next business weekday), please call [facility] to set up or confirm an appointment to see your surgeon in the office for a follow-up appointment.  Usually it is 2-3 weeks after your surgery.   If you have skin staples at your incision(s), let the office know so we can set up a time in the office for the nurse to remove them (usually around  10 days after surgery). Make sure that you call for appointments the day of discharge (or the next business weekday) from the hospital to ensure a convenient appointment time. IF YOU HAVE DISABILITY OR FAMILY LEAVE FORMS, BRING THEM TO THE OFFICE FOR PROCESSING.  DO NOT GIVE THEM TO YOUR DOCTOR.  [facility] [address], [place], [place], [place]  [address] ? [phone] - Main [phone] - [place],  [phone] - Fax [email or web address]  GETTING TO GOOD BOWEL HEALTH. It is expected for your digestive tract to need a few months to get back to normal.  It is common for your bowel movements and stools to be irregular.  You will have occasional bloating and cramping that should eventually fade away.  Until you are eating solid food normally, off all pain medications, and back to regular activities; your bowels will not be normal.   Avoiding constipation The goal: ONE SOFT BOWEL MOVEMENT A DAY!    Drink plenty of fluids.  Choose water first. TAKE A FIBER SUPPLEMENT EVERY DAY THE REST OF YOUR LIFE During your first week back home, gradually add back a fiber supplement every day Experiment which form you can tolerate.   There are many forms such as powders, tablets, wafers, gummies, etc Psyllium bran (Metamucil), methylcellulose  (Citrucel), Miralax or Glycolax, Benefiber, Flax Seed.  Adjust the dose week-by-week (1/2 dose/day to 6 doses a day) until you are moving your bowels 1-2 times a day.  Cut back the dose or try a different fiber product if it is giving you problems such as diarrhea or bloating. Sometimes a laxative is needed to help jump-start bowels if constipated until the fiber supplement can help regulate your bowels.  If you are tolerating eating & you are farting, it is okay to try a gentle laxative such as double dose MiraLax, prune juice, or Milk of Magnesia.  Avoid using laxatives too often. Stool softeners can sometimes help counteract the constipating effects of narcotic pain medicines.  It can also cause diarrhea, so avoid using for too long. If you are still constipated despite taking fiber daily, eating solids, and a few doses of laxatives, call our office. Controlling diarrhea Try drinking liquids and eating bland foods for a few days to avoid stressing your intestines further. Avoid dairy products (especially milk & ice cream) for a short time.  The intestines often can lose the ability to digest lactose when stressed. Avoid foods that cause gassiness or bloating.  Typical foods include beans and other legumes, cabbage, broccoli, and dairy foods.  Avoid greasy, spicy, fast foods.  Every person has some sensitivity to other foods, so listen to your body and avoid those foods that trigger problems for you. Probiotics (such as active yogurt, Align, etc) may help repopulate the intestines and colon with normal bacteria and calm down a sensitive digestive tract Adding a fiber supplement gradually can help thicken stools by absorbing excess fluid and retrain the intestines to act more normally.  Slowly increase the dose over a few weeks.  Too much fiber too soon can backfire and cause cramping & bloating. It is okay to try and slow down diarrhea with a few doses of antidiarrheal medicines.   Bismuth  subsalicylate (ex. Kayopectate, Pepto Bismol) for a few doses can help control diarrhea.  Avoid if pregnant.   Loperamide (Imodium) can slow down diarrhea.  Start with one tablet (71m) first.  Avoid if you are having fevers or severe pain.  ILEOSTOMY PATIENTS WILL HAVE CHRONIC DIARRHEA since their colon is not in use.    Drink plenty of liquids.  You will need to drink even more glasses of water/liquid a day to avoid getting dehydrated. Record output from your ileostomy.  Expect to empty the bag every 3-4 hours at first.  Most people with a permanent ileostomy empty their bag 4-6 times at the least.   Use antidiarrheal medicine (especially Imodium) several times a day to avoid getting dehydrated.  Start with a dose at bedtime & breakfast.  Adjust up or down as needed.  Increase antidiarrheal medications as directed to avoid emptying the bag more than 8 times a day (every 3 hours). Work with your wound ostomy nurse to learn care for your ostomy.  See ostomy care instructions. TROUBLESHOOTING IRREGULAR BOWELS 1) Start with a soft & bland diet. No spicy, greasy, or fried foods.  2) Avoid gluten/wheat or dairy products from diet to see if symptoms improve. 3) Miralax 17gm or flax seed mixed in [place]. water or juice-daily. May use 2-4 times a day as needed. 4) Gas-X, Phazyme, etc. as needed for gas & bloating.  5) Prilosec (omeprazole) over-the-counter as needed 6)  Consider probiotics (Align, Activa, etc) to help calm the bowels down  Call your doctor if you are getting worse or not getting better.  Sometimes further testing (cultures, endoscopy, X-ray studies, CT scans, bloodwork, etc.) may be needed to help diagnose and treat the cause of the diarrhea. [facility], [place], [place], [place], [place]  [id number] [phone] - Main.    [phone]  - Toll Free.   [phone] - Fax [email or web address]

## 2020-09-29 NOTE — Anesthesia Procedure Notes (Signed)
Procedure Name: Intubation Date/Time: 09/29/2020 2:18 PM Performed by: Lavina Hamman, CRNA Pre-anesthesia Checklist: Patient identified, Emergency Drugs available, Suction available, Patient being monitored and Timeout performed Patient Re-evaluated:Patient Re-evaluated prior to induction Oxygen Delivery Method: Circle system utilized Preoxygenation: Pre-oxygenation with 100% oxygen Induction Type: IV induction Ventilation: Mask ventilation without difficulty Laryngoscope Size: Mac and 4 Grade View: Grade I Tube type: Oral Tube size: 7.5 mm Number of attempts: 1 Airway Equipment and Method: Stylet Placement Confirmation: ETT inserted through vocal cords under direct vision,  positive ETCO2,  CO2 detector and breath sounds checked- equal and bilateral Secured at: 23 cm Tube secured with: Tape Dental Injury: Teeth and Oropharynx as per pre-operative assessment  Comments: ATOI

## 2020-09-29 NOTE — H&P (Signed)
Mason [name] Appointment: 07/27/2020 11:15 AM Location: [place] Office Patient #: [id number] DOB: 04/02/1946 Married / Language: English / Race: Black or African American Male   History of Present Illness Mason [name]; 07/27/2020 11:24 AM) The patient is a 75 year old male who presents with a colonic polyp. Note for "Colonic polyp": ` ` ` Patient sent for surgical consultation at the request of Dr Pamalee Leyden [facility]  Chief Complaint: large colon polypoid mass with high-grade dysplasia. Presumed in transverse colon. Consider resection. ` ` The patient is a elderly gentleman has had intermittent rectal bleeding since 2014 after a colon cleanse. Usually moves his bowels a couple times a day. Occasionally takes Pepto. However he's notice more blood this year. Saw gastroenterology. They recommended endoscopic evaluation. Polypoid mass in the mid transverse colon noted. Shows adenomatous tissue with high-grade dysplasia. Surgical consultation recommended. patient has a history of bad gallstone pancreatitis requiring admission and cholecystectomy 2015. He has some seasonal allergies with asthma as well as gout.  patient comes in today with his daughter and grandson. Usually goes for a half hour walk every day. He does have moderate asthma and uses inhaler several times a week. However that is rather stable. He does not smoke. He did have an episode of severe pancreatitis that required cholecystectomy. Splenic vein thrombosis. He is not on blood thinners up. She is not diabetic. No sleep apnea. No cardiac or pulmonary issues. Usually moves his bowels about twice a day. He is convinced that this all started after he done a colon cleanse enema in 2014. Noted he got bleeding then. No family history of bowel problems that he is aware of. No personal nor family history of GI/colon cancer, inflammatory bowel disease, irritable bowel syndrome, allergy such as Celiac Sprue,  dietary/dairy problems, colitis, ulcers nor gastritis. No recent sick contacts/gastroenteritis. No travel outside the country. No changes in diet. No dysphagia to solids or liquids. No significant heartburn or reflux. No melena, hematemesis, coffee ground emesis. No evidence of prior gastric/peptic ulceration.  (Review of systems as stated in this history (HPI) or in the review of systems. Otherwise all other 12 point ROS are negative) ` ` ###########################################`  This patient encounter took 35 minutes today to perform the following: obtain history, perform exam, review outside records, interpret tests & imaging, counsel the patient on their diagnosis; and, document this encounter, including findings & plan in the electronic health record (EHR).   Past Surgical History Illene [name], CMA; 07/27/2020 10:58 AM) Liver Surgery  Pancreas Surgery   Diagnostic Studies History Mason [name], CMA; 07/27/2020 10:58 AM) Colonoscopy  1-5 years ago  Allergies Mason [name], CMA; 07/27/2020 11:00 AM) No Known Drug Allergies  [07/27/2020]:  Medication History (Mason [name], CMA; 07/27/2020 11:00 AM) Albuterol Sulfate HFA (108 (90 Base)MCG/ACT Aerosol Soln, Inhalation) Active. Trelegy Ellipta (200-62.5-25MCG/INH Aero Pow Br Act, Inhalation) Active. Medications Reconciled  Family History Illene [name], CMA; 07/27/2020 10:58 AM) Breast Cancer  Mother, Sister. Diabetes Mellitus  Brother. Family history unknown  First Degree Relatives   Other Problems Illene [name], CMA; 07/27/2020 10:58 AM) Asthma  Chronic Obstructive Lung Disease     Review of Systems (Mason [name] CMA; 07/27/2020 10:58 AM) General Not Present- Appetite Loss, Chills, Fatigue, Fever, Night Sweats, Weight Gain and Weight Loss. Skin Not Present- Change in Wart/Mole, Dryness, Hives, Jaundice, New Lesions, Non-Healing Wounds, Rash and Ulcer. HEENT Present- Seasonal  Allergies and Wears glasses/contact lenses. Not Present- Earache, Hearing Loss, Hoarseness, Nose Bleed, Oral Ulcers, Ringing in  the Ears, Sinus Pain, Sore Throat, Visual Disturbances and Yellow Eyes. Respiratory Not Present- Bloody sputum, Chronic Cough, Difficulty Breathing, Snoring and Wheezing. Breast Not Present- Breast Mass, Breast Pain, Nipple Discharge and Skin Changes. Cardiovascular Not Present- Chest Pain, Difficulty Breathing Lying Down, Leg Cramps, Palpitations, Rapid Heart Rate, Shortness of Breath and Swelling of Extremities. Gastrointestinal Present- Bloody Stool. Not Present- Abdominal Pain, Bloating, Change in Bowel Habits, Chronic diarrhea, Constipation, Difficulty Swallowing, Excessive gas, Gets full quickly at meals, Hemorrhoids, Indigestion, Nausea, Rectal Pain and Vomiting. Male Genitourinary Present- Change in Urinary Stream. Not Present- Blood in Urine, Frequency, Impotence, Nocturia, Painful Urination, Urgency and Urine Leakage. Musculoskeletal Not Present- Back Pain, Joint Pain, Joint Stiffness, Muscle Pain, Muscle Weakness and Swelling of Extremities. Neurological Not Present- Decreased Memory, Fainting, Headaches, Numbness, Seizures, Tingling, Tremor, Trouble walking and Weakness. Psychiatric Not Present- Anxiety, Bipolar, Change in Sleep Pattern, Depression, Fearful and Frequent crying. Endocrine Not Present- Cold Intolerance, Excessive Hunger, Hair Changes, Heat Intolerance, Hot flashes and New Diabetes. Hematology Not Present- Blood Thinners, Easy Bruising, Excessive bleeding, Gland problems, HIV and Persistent Infections.  Vitals (Mason [name] CMA; 07/27/2020 10:59 AM) 07/27/2020 10:59 AM Weight: 184.8 lb Height: 70.5in Body Surface Area: 2.03 m Body Mass Index: 26.14 kg/m  Pulse: 76 (Regular)  BP: 120/78(Sitting, Left Arm, Standard)       Physical Exam Mason [name]; 07/27/2020 11:10 AM) General Mental Status-Alert. General  Appearance-Not in acute distress, Not Sickly. Orientation-Oriented X3. Hydration-Well hydrated. Voice-Normal.  Integumentary Global Assessment Upon inspection and palpation of skin surfaces of the - Axillae: non-tender, no inflammation or ulceration, no drainage. and Distribution of scalp and body hair is normal. General Characteristics Temperature - normal warmth is noted.  Head and Neck Head-normocephalic, atraumatic with no lesions or palpable masses. Face Global Assessment - atraumatic, no absence of expression. Neck Global Assessment - no abnormal movements, no bruit auscultated on the right, no bruit auscultated on the left, no decreased range of motion, non-tender. Trachea-midline. Thyroid Gland Characteristics - non-tender.  Eye Eyeball - Left-Extraocular movements intact, No Nystagmus - Left. Eyeball - Right-Extraocular movements intact, No Nystagmus - Right. Cornea - Left-No Hazy - Left. Cornea - Right-No Hazy - Right. Sclera/Conjunctiva - Left-No scleral icterus, No Discharge - Left. Sclera/Conjunctiva - Right-No scleral icterus, No Discharge - Right. Pupil - Left-Direct reaction to light normal. Pupil - Right-Direct reaction to light normal.  ENMT Ears Pinna - Left - no drainage observed, no generalized tenderness observed. Pinna - Right - no drainage observed, no generalized tenderness observed. Nose and Sinuses External Inspection of the Nose - no destructive lesion observed. Inspection of the nares - Left - quiet respiration. Inspection of the nares - Right - quiet respiration. Mouth and Throat Lips - Upper Lip - no fissures observed, no pallor noted. Lower Lip - no fissures observed, no pallor noted. Nasopharynx - no discharge present. Oral Cavity/Oropharynx - Tongue - no dryness observed. Oral Mucosa - no cyanosis observed. Hypopharynx - no evidence of airway distress observed.  Chest and Lung Exam Inspection Movements -  Normal and Symmetrical. Accessory muscles - No use of accessory muscles in breathing. Palpation Palpation of the chest reveals - Non-tender. Auscultation Breath sounds - Normal and Clear.  Cardiovascular Auscultation Rhythm - Regular. Murmurs & Other Heart Sounds - Auscultation of the heart reveals - No Murmurs and No Systolic Clicks.  Abdomen Inspection Inspection of the abdomen reveals - No Visible peristalsis and No Abnormal pulsations. Umbilicus - No Bleeding, No Urine drainage. Palpation/Percussion  Palpation and Percussion of the abdomen reveal - Soft, Non Tender, No Rebound tenderness, No Rigidity (guarding) and No Cutaneous hyperesthesia. Note: small port site incisions consistent with prior cholecystectomy. No hernia. No abdominal pain. Abdomen soft. Nontender. Not distended. No umbilical or incisional hernias. No guarding.   Male Genitourinary Sexual Maturity Tanner 5 - Adult hair pattern and Adult penile size and shape. Note: left inguinal hernia with Valsalva. Possible right internal hernias well. Spontaneously reduces. Otherwise normal external male genitalia. No inguinal lymphadenopathy.   Peripheral Vascular Upper Extremity Inspection - Left - No Cyanotic nailbeds - Left, Not Ischemic. Inspection - Right - No Cyanotic nailbeds - Right, Not Ischemic.  Neurologic Neurologic evaluation reveals -normal attention span and ability to concentrate, able to name objects and repeat phrases. Appropriate fund of knowledge , normal sensation and normal coordination. Mental Status Affect - not angry, not paranoid. Cranial Nerves-Normal Bilaterally. Gait-Normal.  Neuropsychiatric Mental status exam performed with findings of-able to articulate well with normal speech/language, rate, volume and coherence, thought content normal with ability to perform basic computations and apply abstract reasoning and no evidence of hallucinations, delusions, obsessions or  homicidal/suicidal ideation.  Musculoskeletal Global Assessment Spine, Ribs and Pelvis - no instability, subluxation or laxity. Right Upper Extremity - no instability, subluxation or laxity.  Lymphatic Head & Neck  General Head & Neck Lymphatics: Bilateral - Description - No Localized lymphadenopathy. Axillary  General Axillary Region: Bilateral - Description - No Localized lymphadenopathy. Femoral & Inguinal  Generalized Femoral & Inguinal Lymphatics: Left - Description - No Localized lymphadenopathy. Right - Description - No Localized lymphadenopathy.    Assessment & Plan  HIGH GRADE DYSPLASIA IN COLONIC ADENOMA (D12.6) Impression: polypoid mass with at least high-grade dysplasia in colon. Felt to be probably transverse colon although hard to say.  CT scan abdomen and pelvis c/w proximal transverse colon mass just distal to hepatic flexure  Segmental resection with anastomosis. hopefully these in robotic candidate. May need more lysis of adhesions from prior pancreatitis. We will see.  He seems to have Good exercise tolerance is not a smoker, so I do not feel that cardiac clearance is needed necessarily.  He is interested in proceeding. discussion made with the patient and his daughter as well. Questions answered. They wish to proceed. Current Plans Pt Education - Polyps in the Colon and Rectum (Colonic and Rectal Polyps): colonic polyps PREOP COLON - ENCOUNTER FOR PREOPERATIVE EXAMINATION FOR GENERAL SURGICAL PROCEDURE (Z01.818) Current Plans You are being scheduled for surgery- Our schedulers will call you.  You should hear from our office's scheduling department within 5 working days about the location, date, and time of surgery. We try to make accommodations for patient's preferences in scheduling surgery, but sometimes the OR schedule or the surgeon's schedule prevents [place] from making those accommodations.  If you have not heard from our office  [phone]) in 5 working days, call the office and ask for your surgeon's nurse.  If you have other questions about your diagnosis, plan, or surgery, call the office and ask for your surgeon's nurse.  Written instructions provided The anatomy & physiology of the digestive tract was discussed. The pathophysiology of the colon was discussed. Natural history risks without surgery was discussed. I feel the risks of no intervention will lead to serious problems that outweigh the operative risks; therefore, I recommended a partial colectomy to remove the pathology. Minimally invasive (Robotic/Laparoscopic) & open techniques were discussed.  Risks such as bleeding, infection, abscess, leak, reoperation, possible ostomy, hernia,  heart attack, death, and other risks were discussed. I noted a good likelihood this will help address the problem. Goals of post-operative recovery were discussed as well. Need for adequate nutrition, daily bowel regimen and healthy physical activity, to optimize recovery was noted as well. We will work to minimize complications. Educational materials were available as well. Questions were answered. The patient expresses understanding & wishes to proceed with surgery.  Pt Education - [facility] Colon Bowel Prep 2018 ERAS/Miralax/Antibiotics Pt Education - Pamphlet Given - Laparoscopic Colorectal Surgery: discussed with patient and provided information. Pt Education - [facility] Colectomy post-op instructions: discussed with patient and provided information. Started metroNIDAZOLE 500 MG Oral Tablet, 2 (two) Tablet three times daily, #6, 1 day starting 07/27/2020, No Refill. Local Order: Pharmacist Notes: Pharmacy Instructions: Take 2 tablets at 2pm, 3pm, and 10pm the day prior to your colon operation. Started Neomycin Sulfate 500 MG Oral Tablet, 2 (two) Tablet SEE NOTE, #6, 07/27/2020, No Refill. Local Order: Pharmacist Notes: TAKE TWO TABLETS AT 2 PM, 3 PM, AND 10 PM THE DAY  PRIOR TO SURGERY BILATERAL INGUINAL HERNIA WITHOUT OBSTRUCTION OR GANGRENE, RECURRENCE NOT SPECIFIED (K40.20) Impression: left greater than right and hernias. Not symptomatic. Would hold off on any surgical intervention at this time. Maybe consider in the future given his good health activity an expected longevity making his hernia is more likely an issue in the future.Marland Kitchen However clearly the colon mass is a higher priority at this time. ASTHMA, CHRONIC (J45.909)    Signed electronically by Mason Hector, [name] (07/27/2020 11:25 AM)        Electronically signed by Michael Boston, [name] at 08/10/2020 10:06 AM

## 2020-09-29 NOTE — Anesthesia Postprocedure Evaluation (Signed)
Anesthesia Post Note  Patient: Mason [name]  Procedure(s) Performed: ROBOTIC ASSISTED PROXIMAL RIGHT COLECTOMY WITH BILATERAL TAP BLOCK (N/A Abdomen)     Patient location during evaluation: PACU Anesthesia Type: General Level of consciousness: awake and alert Pain management: pain level controlled Vital Signs Assessment: post-procedure vital signs reviewed and stable Respiratory status: spontaneous breathing, nonlabored ventilation, respiratory function stable and patient connected to nasal cannula oxygen Cardiovascular status: blood pressure returned to baseline and stable Postop Assessment: no apparent nausea or vomiting Anesthetic complications: no   No complications documented.  Last Vitals:  Vitals:   09/29/20 1833 09/29/20 1930  BP: 110/82 116/79  Pulse: 79 79  Resp: 18 16  Temp:  (!) 36.4 C  SpO2: 96% 99%    Last Pain:  Vitals:   09/29/20 1930  TempSrc: Oral  PainSc:                  [name]

## 2020-09-29 NOTE — Transfer of Care (Signed)
Immediate Anesthesia Transfer of Care Note  Patient: Mason [name]  Procedure(s) Performed: ROBOTIC ASSISTED PROXIMAL RIGHT COLECTOMY WITH BILATERAL TAP BLOCK (N/A Abdomen)  Patient Location: PACU  Anesthesia Type:General  Level of Consciousness: awake, alert  and oriented  Airway & Oxygen Therapy: Patient Spontanous Breathing and Patient connected to face mask oxygen  Post-op Assessment: Report given to RN and Post -op Vital signs reviewed and stable  Post vital signs: Reviewed and stable  Last Vitals:  Vitals Value Taken Time  BP    Temp    Pulse 86 09/29/20 1654  Resp    SpO2 99 % 09/29/20 1654  Vitals shown include unvalidated device data.  Last Pain:  Vitals:   09/29/20 1027  TempSrc:   PainSc: 0-No pain         Complications: No complications documented.

## 2020-09-29 NOTE — Anesthesia Preprocedure Evaluation (Addendum)
Anesthesia Evaluation  Patient identified by MRN, date of birth, ID band Patient awake    Reviewed: Allergy & Precautions, NPO status , Patient's Chart, lab work & pertinent test results  Airway Mallampati: I  TM Distance: >3 FB Neck ROM: Full    Dental  (+) Dental Advisory Given, Edentulous Upper, Edentulous Lower   Pulmonary asthma ,    breath sounds clear to auscultation       Cardiovascular negative cardio ROS   Rhythm:Regular Rate:Normal     Neuro/Psych negative neurological ROS  negative psych ROS   GI/Hepatic Neg liver ROS, GERD  ,  Endo/Other  negative endocrine ROS  Renal/GU negative Renal ROS     Musculoskeletal negative musculoskeletal ROS (+)   Abdominal Normal abdominal exam  (+)   Peds  Hematology negative hematology ROS (+)   Anesthesia Other Findings   Reproductive/Obstetrics                            Anesthesia Physical Anesthesia Plan  ASA: II  Anesthesia Plan: General   Post-op Pain Management:    Induction: Intravenous  PONV Risk Score and Plan: 3 and Ondansetron, Dexamethasone and Midazolam  Airway Management Planned: Oral ETT  Additional Equipment: None  Intra-op Plan:   Post-operative Plan: Extubation in OR  Informed Consent: I have reviewed the patients History and Physical, chart, labs and discussed the procedure including the risks, benefits and alternatives for the proposed anesthesia with the patient or authorized representative who has indicated his/her understanding and acceptance.     Dental advisory given  Plan Discussed with: CRNA  Anesthesia Plan Comments:        Anesthesia Quick Evaluation

## 2020-09-30 ENCOUNTER — Encounter (HOSPITAL_COMMUNITY): Payer: Self-pay | Admitting: Surgery

## 2020-09-30 DIAGNOSIS — K219 Gastro-esophageal reflux disease without esophagitis: Secondary | ICD-10-CM | POA: Insufficient documentation

## 2020-09-30 DIAGNOSIS — M25569 Pain in unspecified knee: Secondary | ICD-10-CM | POA: Insufficient documentation

## 2020-09-30 DIAGNOSIS — R972 Elevated prostate specific antigen [PSA]: Secondary | ICD-10-CM | POA: Insufficient documentation

## 2020-09-30 DIAGNOSIS — N529 Male erectile dysfunction, unspecified: Secondary | ICD-10-CM | POA: Insufficient documentation

## 2020-09-30 DIAGNOSIS — J4530 Mild persistent asthma, uncomplicated: Secondary | ICD-10-CM | POA: Insufficient documentation

## 2020-09-30 DIAGNOSIS — R413 Other amnesia: Secondary | ICD-10-CM | POA: Insufficient documentation

## 2020-09-30 DIAGNOSIS — E785 Hyperlipidemia, unspecified: Secondary | ICD-10-CM | POA: Insufficient documentation

## 2020-09-30 LAB — BASIC METABOLIC PANEL
Anion gap: 4 — ABNORMAL LOW (ref 5–15)
BUN: 13 mg/dL (ref 8–23)
CO2: 27 mmol/L (ref 22–32)
Calcium: 8.9 mg/dL (ref 8.9–10.3)
Chloride: 108 mmol/L (ref 98–111)
Creatinine, Ser: 1.16 mg/dL (ref 0.61–1.24)
GFR, Estimated: >60 mL/min (ref >60)
Glucose, Bld: 143 mg/dL — ABNORMAL HIGH (ref 70–99)
Potassium: 5.1 mmol/L (ref 3.5–5.1)
Sodium: 139 mmol/L (ref 135–145)

## 2020-09-30 LAB — CBC
HCT: 41.6 % (ref 39.0–52.0)
Hemoglobin: 14.1 g/dL (ref 13.0–17.0)
MCH: 34.2 pg — ABNORMAL HIGH (ref 26.0–34.0)
MCHC: 33.9 g/dL (ref 30.0–36.0)
MCV: 101 fL — ABNORMAL HIGH (ref 80.0–100.0)
Platelets: 163 10*3/uL (ref 150–400)
RBC: 4.12 MIL/uL — ABNORMAL LOW (ref 4.22–5.81)
RDW: 12.1 % (ref 11.5–15.5)
WBC: 9.5 10*3/uL (ref 4.0–10.5)
nRBC: 0 % (ref 0.0–0.2)

## 2020-09-30 LAB — MAGNESIUM: Magnesium: 1.8 mg/dL (ref 1.7–2.4)

## 2020-09-30 MED ORDER — LACTATED RINGERS IV BOLUS: 1000.0000 mL | Freq: Three times a day (TID) | INTRAVENOUS | Status: DC | PRN

## 2020-09-30 NOTE — Progress Notes (Signed)
Mason [name] [id number] 02/11/1946  CARE TEAM:  PCP: London Pepper, MD  Outpatient Care Team: Patient Care Team: London Pepper, MD as PCP - General (Family Medicine) Wilford Corner, MD as Consulting Physician (Gastroenterology) Michael Boston, MD as Consulting Physician (General Surgery)  Inpatient Treatment Team: Treatment Team: Attending Provider: Michael Boston, MD; Technician: Ernest Mallick, NT; Registered Nurse: Efraim Kaufmann, RN; Consulting Physician: Michael Boston, MD; Registered Nurse: Jennye Boroughs, RN; Technician: Leda Quail, NT; Pharmacist: Eudelia Bunch, [facility]   Problem List:   Principal Problem:   Mass of hepatic flexure s/p robotic proximal right colectomy 09/29/2020   1 Day Post-Op  09/29/2020  POST-OPERATIVE DIAGNOSIS:  TRANSVERSE COLON MASS UNRESECTABLE BY COLONOSCOPY  PROCEDURE:   ROBOTIC PROXIMAL RIGHT COLECTOMY TRANSVERSUS ABDOMINIS PLANE (TAP) BLOCK - BILATERAL  SURGEON:  Adin Hector, MD   Assessment  Recovering well so far  [facility] Stay = 1 days)  Plan:  Enhance recovery ERAS protocol.  Vance diet.  Follow-up on pathology.  -VTE prophylaxis- SCDs, etc  -mobilize as tolerated to help recovery  Disposition:  Disposition:  The patient is from: Home  Anticipate discharge to:  Home  Anticipated Date of Discharge is:  May 13,2022    Barriers to discharge:  Pending Clinical improvement (more likely than not)  Patient currently is NOT MEDICALLY STABLE for discharge from the hospital from a surgery standpoint.      25 minutes spent in review, evaluation, examination, counseling, and coordination of care.   I have reviewed this patient's available data, including medical history, events of note, physical examination and test results as part of my evaluation.  A significant portion of that time was spent in counseling.  Care during the described time interval was provided by  me.  09/30/2020    Subjective: (Chief complaint)  Denies any pain.  Answering diet.  Wanting to get up  Objective:  Vital signs:  Vitals:   09/29/20 1930 09/29/20 2031 09/29/20 2140 09/30/20 0503  BP: 116/79 116/79 108/71 104/79  Pulse: 79 76 77 75  Resp: 16 15 18 17   Temp: (!) 97.5 F (36.4 C) (!) 97.5 F (36.4 C) (!) 97.5 F (36.4 C) (!) 97.4 F (36.3 C)  TempSrc: Oral Oral Oral Oral  SpO2: 99% 99% 97% 98%  Weight:           Intake/Output   Yesterday:  05/11 0701 - 05/12 0700 In: 3742.9 [P.O.:120; I.V.:3334.4; IV Piggyback:288.6] Out: 7858 [IFOYD:7412; Blood:50] This shift:  No intake/output data recorded.  Bowel function:  Flatus: YES  BM:  No  Drain: (No drain)   Physical Exam:  General: Pt awake/alert in no acute distress Eyes: PERRL, normal EOM.  Sclera clear.  No icterus Neuro: CN II-XII intact w/o focal sensory/motor deficits. Lymph: No head/neck/groin lymphadenopathy Psych:  No delerium/psychosis/paranoia.  Oriented x 4 HENT: Normocephalic, Mucus membranes moist.  No thrush Neck: Supple, No tracheal deviation.  No obvious thyromegaly Chest: No pain to chest wall compression.  Good respiratory excursion.  No audible wheezing CV:  Pulses intact.  Regular rhythm.  No major extremity edema MS: Normal AROM mjr joints.  No obvious deformity  Abdomen: Soft.  Nondistended.  Mildly tender at incisions only.  No evidence of peritonitis.  No incarcerated hernias.  Ext:   No deformity.  No mjr edema.  No cyanosis Skin: No petechiae / purpurea.  No major sores.  Warm and dry    Results:   Cultures: Recent Results (from the past 720  hour(s))  SARS CORONAVIRUS 2 (TAT 6-24 HRS) Nasopharyngeal Nasopharyngeal Swab     Status: None   Collection Time: 09/27/20 10:31 AM   Specimen: Nasopharyngeal Swab  Result Value Ref Range Status   SARS Coronavirus 2 NEGATIVE NEGATIVE Final    Comment: (NOTE) SARS-CoV-2 target nucleic acids are NOT DETECTED.  The  SARS-CoV-2 RNA is generally detectable in upper and lower respiratory specimens during the acute phase of infection. Negative results do not preclude SARS-CoV-2 infection, do not rule out co-infections with other pathogens, and should not be used as the sole basis for treatment or other patient management decisions. Negative results must be combined with clinical observations, patient history, and epidemiological information. The expected result is Negative.  Fact Sheet for Patients: SugarRoll.be  Fact Sheet for Healthcare Providers: https://www.woods-mathews.com/  This test is not yet approved or cleared by the [place] FDA and  has been authorized for detection and/or diagnosis of SARS-CoV-2 by FDA under an Emergency Use Authorization (EUA). This EUA will remain  in effect (meaning this test can be used) for the duration of the COVID-19 declaration under Se ction 564(b)(1) of the Act, 21 U.S.C. section 360bbb-3(b)(1), unless the authorization is terminated or revoked sooner.  Performed at [facility], [place] [address] [place], [place] [address]     Labs: Results for orders placed or performed during the hospital encounter of 09/29/20 (from the past 48 hour(s))  Basic metabolic panel     Status: Abnormal   Collection Time: 09/30/20  4:30 AM  Result Value Ref Range   Sodium 139 135 - 145 mmol/L   Potassium 5.1 3.5 - 5.1 mmol/L   Chloride 108 98 - 111 mmol/L   CO2 27 22 - 32 mmol/L   Glucose, Bld 143 (H) 70 - 99 mg/dL    Comment: Glucose reference range applies only to samples taken after fasting for at least 8 hours.   BUN 13 8 - 23 mg/dL   Creatinine, Ser 1.16 0.61 - 1.24 mg/dL   Calcium 8.9 8.9 - 10.3 mg/dL   GFR, Estimated >60 >60 mL/min    Comment: (NOTE) Calculated using the CKD-EPI Creatinine Equation (2021)    Anion gap 4 (L) 5 - 15    Comment: Performed at [facility], [place] [address],  [place], [place] [address]  CBC     Status: Abnormal   Collection Time: 09/30/20  4:30 AM  Result Value Ref Range   WBC 9.5 4.0 - 10.5 K/uL   RBC 4.12 (L) 4.22 - 5.81 MIL/uL   Hemoglobin 14.1 13.0 - 17.0 g/dL   HCT 41.6 39.0 - 52.0 %   MCV 101.0 (H) 80.0 - 100.0 fL   MCH 34.2 (H) 26.0 - 34.0 pg   MCHC 33.9 30.0 - 36.0 g/dL   RDW 12.1 11.5 - 15.5 %   Platelets 163 150 - 400 K/uL   nRBC 0.0 0.0 - 0.2 %    Comment: Performed at [facility], [place] [address], [place], [place] [address]  Magnesium     Status: None   Collection Time: 09/30/20  4:30 AM  Result Value Ref Range   Magnesium 1.8 1.7 - 2.4 mg/dL    Comment: Performed at [facility], [place] [address] [place], [place] [address]    Imaging / Studies: No results found.  Medications / Allergies: per chart  Antibiotics: Anti-infectives (From admission, onward)   Start     Dose/Rate Route Frequency Ordered  Stop   09/30/20 0200  cefoTEtan (CEFOTAN) 2 g in sodium chloride 0.9 % 100 mL IVPB        2 g 200 mL/hr over 30 Minutes Intravenous Every 12 hours 09/29/20 1828 09/30/20 0217   09/29/20 1400  neomycin (MYCIFRADIN) tablet 1,000 mg  Status:  Discontinued       "And" Linked Group Details   1,000 mg Oral 3 times per day 09/29/20 1015 09/29/20 1017   09/29/20 1400  metroNIDAZOLE (FLAGYL) tablet 1,000 mg  Status:  Discontinued       "And" Linked Group Details   1,000 mg Oral 3 times per day 09/29/20 1015 09/29/20 1017   09/29/20 1030  cefoTEtan (CEFOTAN) 2 g in sodium chloride 0.9 % 100 mL IVPB        2 g 200 mL/hr over 30 Minutes Intravenous On call to O.R. 09/29/20 1015 09/29/20 1401        Note: Portions of this report may have been transcribed using voice recognition software. Every effort was made to ensure accuracy; however, inadvertent computerized transcription errors may be present.   Any transcriptional errors that result from this process are unintentional.    Adin Hector,  MD, FACS, MASCRS  Esophageal, Gastrointestinal & Colorectal Surgery Robotic and Minimally Invasive Surgery [facility] [address] [place], [place] [address] [phone] Fax [phone] Main/Paging  CONTACT INFORMATION: Weekday (9AM-5PM) concerns: Call [facility] main office at [phone] Weeknight (5PM-9AM) or Weekend/Holiday concerns: Check [email or web address] for General Surgery [facility] coverage (Please, do not use SecureChat as it is not reliable communication to operating surgeons for immediate patient care)      09/30/2020  7:25 AM

## 2020-10-01 LAB — SURGICAL PATHOLOGY

## 2020-10-01 MED ORDER — TRAMADOL HCL 50 MG PO TABS: 50.0000 mg | ORAL_TABLET | Freq: Four times a day (QID) | ORAL | 0 refills | Status: AC | PRN

## 2020-10-01 NOTE — Progress Notes (Signed)
D/C instructions given to patient. Patient has no questions. NT or writer will wheel patient out once he is dressed  

## 2020-10-01 NOTE — Progress Notes (Signed)
Subjective No acute events. Feeling well. Having flatus and BMs. Tolerating soft diet without any nausea or vomiting. Ambulating on his own. Voiding. Pain well controlled  Objective: Vital signs in last 24 hours: Temp:  [98.1 F (36.7 C)-98.4 F (36.9 C)] 98.4 F (36.9 C) (05/13 0540) Pulse Rate:  [75-92] 85 (05/13 0540) Resp:  [14-18] 18 (05/13 0540) BP: (105-126)/(73-78) 105/73 (05/13 0540) SpO2:  [95 %-96 %] 95 % (05/13 0540) Last BM Date: 09/30/20  Intake/Output from previous day: 05/12 0701 - 05/13 0700 In: 840 [P.O.:840] Out: 700 [Urine:700] Intake/Output this shift: No intake/output data recorded.  Gen: NAD, comfortable CV: RRR Pulm: Normal work of breathing Abd: Soft, NT/ND. Incisions c/d/i without erythema or drainage Ext: SCDs in place  Lab Results: CBC  Recent Labs    09/30/20 0430  WBC 9.5  HGB 14.1  HCT 41.6  PLT 163   BMET Recent Labs    09/30/20 0430  NA 139  K 5.1  CL 108  CO2 27  GLUCOSE 143*  BUN 13  CREATININE 1.16  CALCIUM 8.9   PT/INR No results for input(s): LABPROT, INR in the last 72 hours. ABG No results for input(s): PHART, HCO3 in the last 72 hours.  Invalid input(s): PCO2, PO2  Studies/Results:  Anti-infectives: Anti-infectives (From admission, onward)   Start     Dose/Rate Route Frequency Ordered Stop   09/30/20 0200  cefoTEtan (CEFOTAN) 2 g in sodium chloride 0.9 % 100 mL IVPB        2 g 200 mL/hr over 30 Minutes Intravenous Every 12 hours 09/29/20 1828 09/30/20 0217   09/29/20 1400  neomycin (MYCIFRADIN) tablet 1,000 mg  Status:  Discontinued       "And" Linked Group Details   1,000 mg Oral 3 times per day 09/29/20 1015 09/29/20 1017   09/29/20 1400  metroNIDAZOLE (FLAGYL) tablet 1,000 mg  Status:  Discontinued       "And" Linked Group Details   1,000 mg Oral 3 times per day 09/29/20 1015 09/29/20 1017   09/29/20 1030  cefoTEtan (CEFOTAN) 2 g in sodium chloride 0.9 % 100 mL IVPB        2 g 200 mL/hr over 30  Minutes Intravenous On call to O.R. 09/29/20 1015 09/29/20 1401       Assessment/Plan: Patient Active Problem List   Diagnosis Date Noted  . Elevated PSA 09/30/2020  . Gastroesophageal reflux disease 09/30/2020  . Hyperlipidemia 09/30/2020  . Mild persistent asthma 09/30/2020  . Knee pain 09/30/2020  . Amnesia 09/30/2020  . ED (erectile dysfunction) of organic origin 09/30/2020  . Mass of hepatic flexure s/p robotic proximal right colectomy 09/29/2020 09/29/2020  . High grade dysplasia in colonic adenoma 07/27/2020  . 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 Cyst of pancreas 06/22/2014  . Acute pancreatitis 04/28/2014  . GI bleed 04/14/2014  . Anemia 04/14/2014  . Sepsis ([place]) 04/08/2014  . Abnormal abdominal CT scan 04/07/2014  . Right otitis media 04/02/2014  . Fever 04/01/2014  . History of alcohol use, denies any recent alcohol use 03/26/2014  . SOB (shortness of breath) 07/17/2013  . Intermittent fever 07/17/2013  . Seasonal asthma 07/17/2013  . Fever, unspecified 07/11/2013  . Constipation 07/11/2013  . Cardiomegaly 07/10/2013  . Abnormal ultrasound of abdomen 07/09/2013  . Abdominal pain, acute, periumbilical 75/02/2584  . Nausea & vomiting 07/08/2013  . Overweight (BMI 25.0-29.9) 07/08/2013   s/p Procedure(s): ROBOTIC ASSISTED PROXIMAL RIGHT COLECTOMY WITH BILATERAL TAP  BLOCK 09/29/2020  -Doing great -Pathology pending -Reviewed discharge expectations and answered his questions. He voiced understanding -Follow-up arranged with Dr. Johney Maine   LOS: 2 days   Nadeen Landau, MD [facility] Use [email or web address] to contact on call provider

## 2020-10-01 NOTE — Discharge Summary (Signed)
Patient ID: Mason [name] MRN: [id number] DOB/AGE: 23-Apr-1946 75 y.o.  Admit date: 09/29/2020 Discharge date: 10/01/2020  Discharge Diagnoses Patient Active Problem List   Diagnosis Date Noted  . Elevated PSA 09/30/2020  . Gastroesophageal reflux disease 09/30/2020  . Hyperlipidemia 09/30/2020  . Mild persistent asthma 09/30/2020  . Knee pain 09/30/2020  . Amnesia 09/30/2020  . ED (erectile dysfunction) of organic origin 09/30/2020  . Mass of hepatic flexure s/p robotic proximal right colectomy 09/29/2020 09/29/2020  . High grade dysplasia in colonic adenoma 07/27/2020  . 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 Cyst of pancreas 06/22/2014  . Acute pancreatitis 04/28/2014  . GI bleed 04/14/2014  . Anemia 04/14/2014  . Sepsis ([place]) 04/08/2014  . Abnormal abdominal CT scan 04/07/2014  . Right otitis media 04/02/2014  . Fever 04/01/2014  . History of alcohol use, denies any recent alcohol use 03/26/2014  . SOB (shortness of breath) 07/17/2013  . Intermittent fever 07/17/2013  . Seasonal asthma 07/17/2013  . Fever, unspecified 07/11/2013  . Constipation 07/11/2013  . Cardiomegaly 07/10/2013  . Abnormal ultrasound of abdomen 07/09/2013  . Abdominal pain, acute, periumbilical 85/63/1497  . Nausea & vomiting 07/08/2013  . Overweight (BMI 25.0-29.9) 07/08/2013   Procedures Robotic proximal right colectomy, TAP Block 09/29/20 for transverse colon mass  Hospital Course: He was admitted postoperatively and did well. His diet advanced and he began having spontaneous bowel function. He had no nausea nor emesis. He met all criteria for discharge. On 10/01/20, he is comfortable with and stable for discharge home.  Allergies as of 10/01/2020   No Known Allergies     Medication List    STOP taking these medications   Trelegy Ellipta 200-62.5-25 MCG/INH Aepb Generic drug: Fluticasone-Umeclidin-Vilant     TAKE these medications   albuterol 108 (90 Base)  MCG/ACT inhaler Commonly known as: VENTOLIN HFA Inhale 1-2 puffs into the lungs every 6 (six) hours as needed for wheezing or shortness of breath.   Bayer Back & Body Pain Ex St 500-32.5 MG Tabs Generic drug: Aspirin-Caffeine Take 2 tablets by mouth daily as needed (pain).   EPINEPHrine 0.3 mg/0.3 mL Soaj injection Commonly known as: EPI-PEN Use as directed for life-threatening allergic reactions- Xolair protocol   traMADol 50 MG tablet Commonly known as: ULTRAM Take 1-2 tablets (50-100 mg total) by mouth every 6 (six) hours as needed for moderate pain or severe pain.   traMADol 50 MG tablet Commonly known as: Ultram Take 1 tablet (50 mg total) by mouth every 6 (six) hours as needed for up to 5 days (postop pain not controlled with tylenol and ibuprofen).            Discharge Care Instructions  (From admission, onward)         Start     Ordered   09/29/20 0000  Discharge wound care:       Comments: It is good for closed incisions and even open wounds to be washed every day.  Shower every day.  Short baths are fine.  Wash the incisions and wounds clean with soap & water.    You may leave closed incisions open to air if it is dry.   You may cover the incision with clean gauze & replace it after your daily shower for comfort.  TEGADERM:  You have clear gauze band-aid dressings over your closed incision(s).  Remove the dressings 3 days after surgery = 5/14 Saturday   09/29/20 1320  Follow-up Information    Michael Boston, MD. Schedule an appointment as soon as possible for a visit in 3 weeks.   Specialties: General Surgery, Colon and Rectal Surgery Why: To follow up after your operation, To follow up after your hospital stay Contact information: [place] [address] [phone]               Amreen Raczkowski M. Dema Severin, M.D. [facility]

## 2020-10-05 ENCOUNTER — Emergency Department (HOSPITAL_COMMUNITY): Payer: Medicare Other

## 2020-10-05 ENCOUNTER — Other Ambulatory Visit: Payer: Self-pay

## 2020-10-05 ENCOUNTER — Encounter (HOSPITAL_COMMUNITY): Payer: Self-pay

## 2020-10-05 ENCOUNTER — Emergency Department (HOSPITAL_COMMUNITY)
Admission: EM | Admit: 2020-10-05 | Discharge: 2020-10-05 | Disposition: A | Discharge status: Home / Self Care | Payer: Medicare Other | Attending: Emergency Medicine | Admitting: Emergency Medicine

## 2020-10-05 DIAGNOSIS — R109 Unspecified abdominal pain: Secondary | ICD-10-CM | POA: Diagnosis not present

## 2020-10-05 DIAGNOSIS — I700 Atherosclerosis of aorta: Secondary | ICD-10-CM | POA: Diagnosis not present

## 2020-10-05 DIAGNOSIS — Z9049 Acquired absence of other specified parts of digestive tract: Secondary | ICD-10-CM | POA: Insufficient documentation

## 2020-10-05 DIAGNOSIS — G8918 Other acute postprocedural pain: Secondary | ICD-10-CM | POA: Diagnosis not present

## 2020-10-05 DIAGNOSIS — J45909 Unspecified asthma, uncomplicated: Secondary | ICD-10-CM | POA: Diagnosis not present

## 2020-10-05 DIAGNOSIS — K219 Gastro-esophageal reflux disease without esophagitis: Secondary | ICD-10-CM | POA: Diagnosis not present

## 2020-10-05 DIAGNOSIS — M47816 Spondylosis without myelopathy or radiculopathy, lumbar region: Secondary | ICD-10-CM | POA: Diagnosis not present

## 2020-10-05 DIAGNOSIS — K6389 Other specified diseases of intestine: Secondary | ICD-10-CM | POA: Diagnosis not present

## 2020-10-05 DIAGNOSIS — K5790 Diverticulosis of intestine, part unspecified, without perforation or abscess without bleeding: Secondary | ICD-10-CM | POA: Diagnosis not present

## 2020-10-05 LAB — CBC WITH DIFFERENTIAL/PLATELET
Abs Immature Granulocytes: 0.01 10*3/uL (ref 0.00–0.07)
Basophils Absolute: 0 10*3/uL (ref 0.0–0.1)
Basophils Relative: 1 %
Eosinophils Absolute: 0 10*3/uL (ref 0.0–0.5)
Eosinophils Relative: 0 %
HCT: 39.4 % (ref 39.0–52.0)
Hemoglobin: 13 g/dL (ref 13.0–17.0)
Immature Granulocytes: 0 %
Lymphocytes Relative: 28 %
Lymphs Abs: 1.4 10*3/uL (ref 0.7–4.0)
MCH: 33.4 pg (ref 26.0–34.0)
MCHC: 33 g/dL (ref 30.0–36.0)
MCV: 101.3 fL — ABNORMAL HIGH (ref 80.0–100.0)
Monocytes Absolute: 0.5 10*3/uL (ref 0.1–1.0)
Monocytes Relative: 10 %
Neutro Abs: 3.1 10*3/uL (ref 1.7–7.7)
Neutrophils Relative %: 61 %
Platelets: 227 10*3/uL (ref 150–400)
RBC: 3.89 MIL/uL — ABNORMAL LOW (ref 4.22–5.81)
RDW: 12.7 % (ref 11.5–15.5)
WBC: 5.1 10*3/uL (ref 4.0–10.5)
nRBC: 0 % (ref 0.0–0.2)

## 2020-10-05 LAB — COMPREHENSIVE METABOLIC PANEL
ALT: 136 U/L — ABNORMAL HIGH (ref 0–44)
AST: 158 U/L — ABNORMAL HIGH (ref 15–41)
Albumin: 3.4 g/dL — ABNORMAL LOW (ref 3.5–5.0)
Alkaline Phosphatase: 61 U/L (ref 38–126)
Anion gap: 5 (ref 5–15)
BUN: 17 mg/dL (ref 8–23)
CO2: 32 mmol/L (ref 22–32)
Calcium: 9.3 mg/dL (ref 8.9–10.3)
Chloride: 108 mmol/L (ref 98–111)
Creatinine, Ser: 1.04 mg/dL (ref 0.61–1.24)
GFR, Estimated: >60 mL/min (ref >60)
Glucose, Bld: 100 mg/dL — ABNORMAL HIGH (ref 70–99)
Potassium: 3.8 mmol/L (ref 3.5–5.1)
Sodium: 145 mmol/L (ref 135–145)
Total Bilirubin: 0.9 mg/dL (ref 0.3–1.2)
Total Protein: 6.4 g/dL — ABNORMAL LOW (ref 6.5–8.1)

## 2020-10-05 LAB — LIPASE, BLOOD: Lipase: 28 U/L (ref 11–51)

## 2020-10-05 NOTE — Discharge Instructions (Addendum)
You have been evaluated for your abdominal pain after surgery.  Fortunately CT scan obtained today did not show any concerning finding.  Your labs are reassuring.  You may call and follow-up closely with your surgeon as needed.  Return if you have any concern.

## 2020-10-05 NOTE — ED Provider Notes (Signed)
[facility] DEPT Provider Note   CSN: [id number] Arrival date & time: 10/05/20  1517     History Chief Complaint  Patient presents with  . Post-op Problem    Mason [name] is a 75 y.o. male.  The history is provided by the patient and medical records. No language interpreter was used.      75 year old male significant history of necrotizing pancreatitis, GERD, recently had a robotic proximal right colectomy on 09/29/2020 for transverse colon mass presenting today for evaluation of abdominal pain.  Patient states since the procedure he was doing fine.  He has had bowel movement.  Yesterday he while having bowel movement he felt pain at the surgical site.  Pain is described as a throbbing sensation initially started in the left side.  He later on felt similar pain but to the right side of his abdomen.  Pain more noticeable when he moving around or ambulate and resolves when he lays flat.  He does feel some small swelling and bulging sensation near the site.  He denies any associated fever or chills no nausea vomiting or diarrhea no chest pain shortness of breath productive cough no dysuria.  At rest, pain is very minimal.  Past Medical History:  Diagnosis Date  . GERD (gastroesophageal reflux disease)   . Gout attack 1990  . Mass of colon   . Necrotizing pancreatitis 03/26/2014  . Pancreatitis, acute 07/08/2013  . Pancreatitis, gallstone   . Seasonal allergies   . Seasonal asthma    "related to allergies"    Patient Active Problem List   Diagnosis Date Noted  . Elevated PSA 09/30/2020  . Gastroesophageal reflux disease 09/30/2020  . Hyperlipidemia 09/30/2020  . Mild persistent asthma 09/30/2020  . Knee pain 09/30/2020  . Amnesia 09/30/2020  . ED (erectile dysfunction) of organic origin 09/30/2020  . Mass of hepatic flexure s/p robotic proximal right colectomy 09/29/2020 09/29/2020  . High grade dysplasia in colonic adenoma 07/27/2020  . 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 Cyst of pancreas 06/22/2014  . Acute pancreatitis 04/28/2014  . GI bleed 04/14/2014  . Anemia 04/14/2014  . Sepsis ([place]) 04/08/2014  . Abnormal abdominal CT scan 04/07/2014  . Right otitis media 04/02/2014  . Fever 04/01/2014  . History of alcohol use, denies any recent alcohol use 03/26/2014  . SOB (shortness of breath) 07/17/2013  . Intermittent fever 07/17/2013  . Seasonal asthma 07/17/2013  . Fever, unspecified 07/11/2013  . Constipation 07/11/2013  . Cardiomegaly 07/10/2013  . Abnormal ultrasound of abdomen 07/09/2013  . Abdominal pain, acute, periumbilical 01/60/1093  . Nausea & vomiting 07/08/2013  . Overweight (BMI 25.0-29.9) 07/08/2013    Past Surgical History:  Procedure Laterality Date  . CHOLECYSTECTOMY N/A 03/29/2014   Procedure: LAPAROSCOPIC CHOLECYSTECTOMY WITH INTRAOPERATIVE CHOLANGIOGRAM;  Surgeon: Coralie Keens, MD;  Location: [facility];  Service: General;  Laterality: N/A;  . CIRCUMCISION  ~ 1974  . EYE EXAMINATION UNDER ANESTHESIA W/ RETINAL CRYOTHERAPY AND RETINAL LASER Right 2014   "for tear"  . PANCREAS SURGERY         History reviewed. No pertinent family history.  Social History   Tobacco Use  . Smoking status: Never Smoker  . Smokeless tobacco: Never Used  Vaping Use  . Vaping Use: Never used  Substance Use Topics  . Alcohol use: Yes    Comment: 2 beer a month  . Drug use: No    Home Medications Prior to Admission medications  Medication Sig Start Date End Date Taking? Authorizing Provider  albuterol (VENTOLIN HFA) 108 (90 Base) MCG/ACT inhaler Inhale 1-2 puffs into the lungs every 6 (six) hours as needed for wheezing or shortness of breath. 04/20/20   Valentina Shaggy, MD  Aspirin-Caffeine (BAYER BACK & BODY PAIN EX ST) 500-32.5 MG TABS Take 2 tablets by mouth daily as needed (pain).    [provider]  EPINEPHrine 0.3 mg/0.3 mL IJ SOAJ injection Use as  directed for life-threatening allergic reactions- Xolair protocol 09/17/20   Kennith Gain, MD  traMADol (ULTRAM) 50 MG tablet Take 1-2 tablets (50-100 mg total) by mouth every 6 (six) hours as needed for moderate pain or severe pain. 09/29/20   Michael Boston, MD  traMADol (ULTRAM) 50 MG tablet Take 1 tablet (50 mg total) by mouth every 6 (six) hours as needed for up to 5 days (postop pain not controlled with tylenol and ibuprofen). 10/01/20 10/06/20  Ileana Roup, MD    Allergies    Patient has no known allergies.  Review of Systems   Review of Systems  All other systems reviewed and are negative.   Physical Exam Updated Vital Signs BP 116/77 (BP Location: Left Arm)   Pulse 98   Temp 98.6 F (37 C) (Oral)   Resp 16   Ht 5' 10.5" (1.791 m)   Wt 82.1 kg   SpO2 98%   BMI 25.60 kg/m   Physical Exam Vitals and nursing note reviewed.  Constitutional:      General: He is not in acute distress.    Appearance: He is well-developed.  HENT:     Head: Atraumatic.  Eyes:     Conjunctiva/sclera: Conjunctivae normal.  Cardiovascular:     Rate and Rhythm: Normal rate and regular rhythm.     Pulses: Normal pulses.     Heart sounds: Normal heart sounds.  Pulmonary:     Effort: Pulmonary effort is normal.     Breath sounds: Normal breath sounds.  Abdominal:     General: Abdomen is flat. Bowel sounds are normal.     Palpations: Abdomen is soft.     Tenderness: There is abdominal tenderness (Patient does have some mild tenderness along the laparoscopic surgical site on his abdomen without any obvious hernia or any signs of cellulitis.).  Musculoskeletal:        General: Normal range of motion.     Cervical back: Neck supple.  Skin:    Findings: No rash.  Neurological:     Mental Status: He is alert and oriented to person, place, and time.  Psychiatric:        Mood and Affect: Mood normal.     ED Results / Procedures / Treatments   Labs (all labs ordered are  listed, but only abnormal results are displayed) Labs Reviewed  CBC WITH DIFFERENTIAL/PLATELET - Abnormal; Notable for the following components:      Result Value   RBC 3.89 (*)    MCV 101.3 (*)    All other components within normal limits  COMPREHENSIVE METABOLIC PANEL - Abnormal; Notable for the following components:   Glucose, Bld 100 (*)    Total Protein 6.4 (*)    Albumin 3.4 (*)    AST 158 (*)    ALT 136 (*)    All other components within normal limits  LIPASE, BLOOD  URINALYSIS, ROUTINE W REFLEX MICROSCOPIC    EKG None  Radiology CT ABDOMEN PELVIS WO CONTRAST  Result Date: 10/05/2020  CLINICAL DATA:  Acute abdominal pain, history of prior colectomy EXAM: CT ABDOMEN AND PELVIS WITHOUT CONTRAST TECHNIQUE: Multidetector CT imaging of the abdomen and pelvis was performed following the standard protocol without IV contrast. COMPARISON:  08/18/2020 FINDINGS: Lower chest: No acute abnormality. Hepatobiliary: No focal liver abnormality is seen. Status post cholecystectomy. No biliary dilatation. Pancreas: Unremarkable. No pancreatic ductal dilatation or surrounding inflammatory changes. Spleen: Normal in size without focal abnormality. Adrenals/Urinary Tract: Adrenal glands are within normal limits. Kidneys demonstrate no renal calculi or obstructive changes. The bladder is decompressed. Stomach/Bowel: Scattered diverticular change of the colon is noted. No diverticulitis is seen. There is a side to side anastomosis between the distal ileum and mid transverse colon. It is widely patent. Some fecalization of bowel contents is noted in the distal ileum. No obstructive changes are seen. The small bowel is otherwise unremarkable. Stomach is within normal limits. Vascular/Lymphatic: Aortic atherosclerosis. No enlarged abdominal or pelvic lymph nodes. Reproductive: Prostate is unremarkable. Other: Minimal free fluid is noted felt to be related to the recent surgery. Considerable amount of  subcutaneous air is noted also felt to be related to recent laparoscopic colectomy no free intraperitoneal air is seen. No focal abscess is identified. Musculoskeletal: Degenerative changes of lumbar spine are noted. No acute bony abnormality is seen. IMPRESSION: Changes consistent with the known history of prior right colectomy with laparoscopic guidance. Patent anastomosis is seen. Minimal fluid in the abdomen as well as air in the subcutaneous tissues of the abdominal wall are noted consistent with the recent surgery. No abscess is identified. Diverticulosis without diverticulitis. No other focal abnormality is noted. Electronically Signed   By: Inez Catalina M.D.   On: 10/05/2020 16:57    Procedures Procedures   Medications Ordered in ED Medications - No data to display  ED Course  I have reviewed the triage vital signs and the nursing notes.  Pertinent labs & imaging results that were available during my care of the patient were reviewed by me and considered in my medical decision making (see chart for details).    MDM Rules/Calculators/A&P                          BP 114/73   Pulse 77   Temp 98.6 F (37 C) (Oral)   Resp 18   Ht 5' 10.5" (1.791 m)   Wt 82.1 kg   SpO2 97%   BMI 25.60 kg/m   Final Clinical Impression(s) / ED Diagnoses Final diagnoses:  Postoperative pain    Rx / DC Orders ED Discharge Orders    None     4:04 PM Patient here with abdominal pain at the surgical site which started last night.  He recently had a colectomy performed 6 days ago due to having a transverse colonic mass.  On exam I did not appreciate any signs of hernia or cellulitis at the surgical site.  Given recent surgical procedure, will obtain abdominal pelvis CT scan for further evaluation.  At this time patient denies having any active pain.  Care discussed with Dr. Zenia Resides.   5:01 PM Patient's labs are reassuring.  CT scan shows changes consistent with known history of prior right  colectomy with laparoscopic guidance.  Fluid in the abdomen as well as in the subcutaneous tissue of the abdominal wall consistent with recent surgery.  No other concerning features were noted.  At this time, patient is stable for discharge.  Return precaution given.  Domenic Moras, PA-C 10/05/20 1709    Lacretia Leigh, MD 10/06/20 [phone]

## 2020-10-05 NOTE — ED Triage Notes (Signed)
Patient had a colectomy on 09/29/20. Patient states he began having increased pain on the right side of his abdomen and then the whole abdomen. Patient states he feels like his abdomen is swollen.

## 2020-10-08 ENCOUNTER — Encounter: Payer: Self-pay | Admitting: Allergy

## 2020-10-08 ENCOUNTER — Other Ambulatory Visit: Payer: Self-pay

## 2020-10-08 ENCOUNTER — Ambulatory Visit: Payer: Self-pay

## 2020-10-08 ENCOUNTER — Ambulatory Visit (INDEPENDENT_AMBULATORY_CARE_PROVIDER_SITE_OTHER): Payer: Medicare Other | Admitting: Family

## 2020-10-08 ENCOUNTER — Telehealth: Payer: Self-pay

## 2020-10-08 VITALS — BP 118/72 | HR 90 | Temp 97.3°F | Resp 14 | Ht 72.0 in | Wt 174.6 lb

## 2020-10-08 DIAGNOSIS — J4540 Moderate persistent asthma, uncomplicated: Secondary | ICD-10-CM

## 2020-10-08 DIAGNOSIS — R410 Disorientation, unspecified: Secondary | ICD-10-CM

## 2020-10-08 DIAGNOSIS — J4550 Severe persistent asthma, uncomplicated: Secondary | ICD-10-CM | POA: Diagnosis not present

## 2020-10-08 DIAGNOSIS — J310 Chronic rhinitis: Secondary | ICD-10-CM

## 2020-10-08 MED ORDER — EPINEPHRINE 0.3 MG/0.3ML IJ SOAJ: INTRAMUSCULAR | 1 refills | Status: DC

## 2020-10-08 NOTE — Telephone Encounter (Signed)
I called patient's daughter and she will be able to take Mason [name] home. She said she will be at our office in 15 minutes. She verbalized understanding that he is not supposed to be driving and will come back later for his vehicle.

## 2020-10-08 NOTE — Patient Instructions (Addendum)
1. Severe persistent asthma with exacerbation -We are going to try to get you set up with home health/social work services to help with your daily medication administration - Continue Xolair injections every month.  - use Spacer device with albuterol (proair) - Daily controller medication(s): Trelegy 200/62.5/25 1 puff once daily. Make sure to use this inhaler every day   - Prior to physical activity: albuterol (ProAir) 2 puffs 10-15 minutes before physical activity. - Rescue medications: albuterol (ProAir) 4 puffs every 4-6 hours as needed - Asthma control goals:  * Full participation in all desired activities (may need albuterol before activity) * Albuterol use two time or less a week on average (not counting use with activity) * Cough interfering with sleep two time or less a month * Oral steroids no more than once a year * No hospitalizations  2. Chronic rhinitis (dust mite, cat, dog, grass, trees, and ragweed) - Can take as needed: Xyzal (levocetirizine) 5mg  tablet once daily - You can use an extra dose of the antihistamine, if needed, for breakthrough symptoms.  - Consider nasal saline rinses 1-2 times daily to remove allergens from the nasal cavities as well as help with mucous clearance (this is especially helpful to do before the nasal sprays are given)  Keep already scheduled follow-up on August 9,2022 @ 1:30 pm with Dr. Ernst Bowler

## 2020-10-08 NOTE — Progress Notes (Addendum)
[facility] [place] [address] Dept: [phone]  FOLLOW UP NOTE  Patient ID: Mason [name], male    DOB: [birth year]  Age: 75 y.o. MRN: [id number] Date of Office Visit: 10/08/2020  Assessment  Chief Complaint: Asthma (Has been having congestion in his chest with coughing - only when active )  HPI Mason [name] is a 75 year old male that presents today for follow-up of severe persistent asthma with exacerbation and chronic rhinitis.  He was last seen on September 17, 2020 by Dr. Nelva Bush.  His daughter was here with him at the end of the visit to pick him up and drive him home since he had a hard time finding our office and reported that he had taken pain medicine today from his recent robotic proximal right colectomy on Sep 29, 2020.  Our Engineer, building services and front office staff found him walking on the corner of [address] in [place] and had to help him across the road to come to our office.  He did not recognize the office at all.  Severe persistent asthma is reported as not well controlled with Trelegy 200/62.5/25 mcg 1 puff once a day, Xolair injection 300 mg every 4 weeks, and albuterol as needed.  He reports that he has not been using Trelegy every day and maybe uses it 3-4 times a week.  He did not know that he was supposed to use this medication every day.  He has been using his albuterol approximately 2 times a day.  He reports a cough only because he tries to cough up phlegm.  When he coughs up phlegm it is clear in color. He denies any fever or chills.  He also reports some wheezing when cutting the grass, tightness in his chest at times, and shortness of breath at times.  He reports that his breathing is 10 to 15% better since we last saw him, but then he says that he has never been here before.  He also mentions that he has never received Xolair injections before for his asthma.  Chronic rhinitis is reported as controlled at this time.  He is not certain if he is  taking Xyzal but reports that this sounds familiar and that he may have taken this medication.  He denies rhinorrhea, nasal congestion and postnasal drip.  He has not had any sinus infections since we last saw him.  He reports itchy watery eyes at times only when he is cutting the grass.  Discussed with the daughter that we were concerned for his confusion since he had a hard time finding our office and stating that he had never been here before.  Also, discussed that we would try to help in any way we could to get him some help with medication administration and that we would also contact his primary care physician to see what they could do on their end.  Also, instructed his daughter that he should not be driving while taking pain medication. She verbalizes understanding  Drug Allergies:  No Known Allergies  Review of Systems: Review of Systems  Constitutional: Negative for chills and fever.  HENT:       Denies rhinorrhea, nasal congestion, and post nasal drip  Eyes:       Reports itchy watery eyes at times after mowing grass  Respiratory: Positive for cough, shortness of breath and wheezing.        Reports a cough only because he tries to cough, reports tightness in chest at times,  shortness of breath at times, and wheezing at times  Cardiovascular: Negative for chest pain and palpitations.  Gastrointestinal: Negative for heartburn.  Genitourinary: Negative for dysuria.  Skin: Negative for itching and rash.  Neurological: Negative for headaches.  Endo/Heme/Allergies: Positive for environmental allergies.   Physical Exam: BP 118/72   Pulse 90   Temp (!) 97.3 F (36.3 C)   Resp 14   Ht 6' (1.829 m)   Wt 174 lb 9.6 oz (79.2 kg)   SpO2 93%   BMI 23.68 kg/m    Physical Exam Constitutional:      Appearance: Normal appearance.  HENT:     Head: Normocephalic and atraumatic.     Comments: Pharynx normal. Eyes normal. Ears normal. Nose normal    Right Ear: Tympanic membrane, ear  canal and external ear normal.     Left Ear: Tympanic membrane, ear canal and external ear normal.     Nose: Nose normal.     Mouth/Throat:     Mouth: Mucous membranes are moist.     Pharynx: Oropharynx is clear.  Eyes:     Conjunctiva/sclera: Conjunctivae normal.  Cardiovascular:     Rate and Rhythm: Regular rhythm.     Heart sounds: Normal heart sounds.  Pulmonary:     Effort: Pulmonary effort is normal.     Breath sounds: Normal breath sounds.     Comments: Lungs clear to auscultation Musculoskeletal:     Cervical back: Neck supple.  Skin:    General: Skin is warm.  Neurological:     Mental Status: He is alert and oriented to person, place, and time.  Psychiatric:        Mood and Affect: Mood normal.        Behavior: Behavior normal.        Thought Content: Thought content normal.        Judgment: Judgment normal.     Diagnostics:  None  Assessment and Plan: 1. Severe persistent asthma, uncomplicated   2. Chronic rhinitis   3. Confusion     Meds ordered this encounter  Medications  . EPINEPHrine 0.3 mg/0.3 mL IJ SOAJ injection    Sig: Use as directed for life-threatening allergic reactions- Xolair protocol    Dispense:  2 each    Refill:  1    Please dispense Mylan generic, no adrenaclick.  Thank you- HKR    Patient Instructions  1. Severe persistent asthma with exacerbation -We are going to try to get you set up with home health/social work services to help with your daily medication administration - Continue Xolair injections every month.  - use Spacer device with albuterol (proair) - Daily controller medication(s): Trelegy 200/62.5/25 1 puff once daily. Make sure to use this inhaler every day   - Prior to physical activity: albuterol (ProAir) 2 puffs 10-15 minutes before physical activity. - Rescue medications: albuterol (ProAir) 4 puffs every 4-6 hours as needed - Asthma control goals:  * Full participation in all desired activities (may need albuterol  before activity) * Albuterol use two time or less a week on average (not counting use with activity) * Cough interfering with sleep two time or less a month * Oral steroids no more than once a year * No hospitalizations  2. Chronic rhinitis (dust mite, cat, dog, grass, trees, and ragweed) - Can take as needed: Xyzal (levocetirizine) 5mg  tablet once daily - You can use an extra dose of the antihistamine, if needed, for breakthrough symptoms.  - Consider  nasal saline rinses 1-2 times daily to remove allergens from the nasal cavities as well as help with mucous clearance (this is especially helpful to do before the nasal sprays are given)  Keep already scheduled follow-up on August 9,2022 @ 1:30 pm with Dr. Ernst Bowler     Return in about 3 months (around 12/28/2020), or if symptoms worsen or fail to improve.    Thank you for the opportunity to care for this patient.  Please do not hesitate to contact me with questions.  Althea Charon, FNP [facility]

## 2020-10-08 NOTE — Telephone Encounter (Signed)
Noted! Thank you

## 2020-10-26 ENCOUNTER — Telehealth: Payer: Self-pay | Admitting: *Deleted

## 2020-10-26 NOTE — Telephone Encounter (Signed)
[facility] to follow up on referral that was placed by Althea Charon, FNP on 10/08/20.  Referral has been approved by PCP. Spoke to [place] at [facility] and she did not have Mr. Symmonds listed in her system and did not have him down in new referrals.  I will print out information and referral and approval and fax to [facility] at [phone].

## 2020-11-05 ENCOUNTER — Ambulatory Visit (INDEPENDENT_AMBULATORY_CARE_PROVIDER_SITE_OTHER): Payer: Medicare Other | Admitting: Allergy

## 2020-11-05 ENCOUNTER — Ambulatory Visit: Payer: Self-pay

## 2020-11-05 ENCOUNTER — Other Ambulatory Visit: Payer: Self-pay

## 2020-11-05 DIAGNOSIS — J4550 Severe persistent asthma, uncomplicated: Secondary | ICD-10-CM | POA: Diagnosis not present

## 2020-12-03 ENCOUNTER — Ambulatory Visit (INDEPENDENT_AMBULATORY_CARE_PROVIDER_SITE_OTHER): Payer: Medicare Other

## 2020-12-03 ENCOUNTER — Other Ambulatory Visit: Payer: Self-pay

## 2020-12-03 DIAGNOSIS — J4550 Severe persistent asthma, uncomplicated: Secondary | ICD-10-CM | POA: Diagnosis not present

## 2020-12-06 DIAGNOSIS — J4550 Severe persistent asthma, uncomplicated: Secondary | ICD-10-CM | POA: Diagnosis not present

## 2020-12-13 DIAGNOSIS — J45909 Unspecified asthma, uncomplicated: Secondary | ICD-10-CM | POA: Diagnosis not present

## 2020-12-28 ENCOUNTER — Ambulatory Visit: Payer: Medicare Other | Admitting: Allergy & Immunology

## 2020-12-31 ENCOUNTER — Ambulatory Visit: Payer: Self-pay

## 2021-01-04 ENCOUNTER — Other Ambulatory Visit: Payer: Self-pay | Admitting: Allergy & Immunology

## 2021-01-04 ENCOUNTER — Ambulatory Visit: Payer: Medicare Other | Admitting: Allergy & Immunology

## 2021-01-04 ENCOUNTER — Other Ambulatory Visit: Payer: Self-pay

## 2021-01-04 ENCOUNTER — Ambulatory Visit: Payer: Medicare Other

## 2021-01-04 ENCOUNTER — Encounter: Payer: Self-pay | Admitting: Allergy & Immunology

## 2021-01-04 VITALS — BP 126/76 | HR 82 | Temp 98.5°F | Ht 71.5 in | Wt 189.4 lb

## 2021-01-04 DIAGNOSIS — J3089 Other allergic rhinitis: Secondary | ICD-10-CM | POA: Diagnosis not present

## 2021-01-04 DIAGNOSIS — J302 Other seasonal allergic rhinitis: Secondary | ICD-10-CM

## 2021-01-04 DIAGNOSIS — J4550 Severe persistent asthma, uncomplicated: Secondary | ICD-10-CM | POA: Diagnosis not present

## 2021-01-04 DIAGNOSIS — J45998 Other asthma: Secondary | ICD-10-CM | POA: Diagnosis not present

## 2021-01-04 MED ORDER — TRELEGY ELLIPTA 200-62.5-25 MCG/INH IN AEPB: 1.0000 | INHALATION_SPRAY | Freq: Every day | RESPIRATORY_TRACT | 5 refills | Status: DC

## 2021-01-04 MED ORDER — ALBUTEROL SULFATE (2.5 MG/3ML) 0.083% IN NEBU: 2.5000 mg | INHALATION_SOLUTION | RESPIRATORY_TRACT | 1 refills | Status: DC | PRN

## 2021-01-04 MED ORDER — LEVOCETIRIZINE DIHYDROCHLORIDE 5 MG PO TABS: 5.0000 mg | ORAL_TABLET | Freq: Every evening | ORAL | 5 refills | Status: DC

## 2021-01-04 MED ORDER — ALBUTEROL SULFATE HFA 108 (90 BASE) MCG/ACT IN AERS: 1.0000 | INHALATION_SPRAY | Freq: Four times a day (QID) | RESPIRATORY_TRACT | 1 refills | Status: DC | PRN

## 2021-01-04 MED ORDER — EPINEPHRINE 0.3 MG/0.3ML IJ SOAJ: 0.3000 mg | INTRAMUSCULAR | 1 refills | Status: DC | PRN

## 2021-01-04 NOTE — Progress Notes (Signed)
FOLLOW UP  Date of Service/Encounter:  01/04/21   Assessment:   Severe persistent asthma, uncomplicated   Perennial and seasonal allergic rhinitis (dust mite, cat, dog, grass, trees, and ragweed)   No smoking history    Fully vaccinated against COVID-19 (received booster today)   ? dementia - although seemed fairly lucid today  Plan/Recommendations:   1. Severe persistent asthma with exacerbation - Lung testing actually looked better than last time.  - We are going to add on an albuterol nebulizer to use when your symptoms of shortness of breath become really bad (this is the SAME medication as the long tube with albuterol, but more medication).   We are going to continue with the Xolair for another couple of months to see if this is helping (it should help you to avoid going to the hospital and the ED for wheezing). - Daily controller medication(s): Trelegy 200/62.5/25 1 puff once daily + Xolair every month   - Rescue medications: albuterol (ProAir) 4 puffs every 4-6 hours as needed and albuterol nebulizer solution via nebulizer - Asthma control goals:  * Full participation in all desired activities (may need albuterol before activity) * Albuterol use two time or less a week on average (not counting use with activity) * Cough interfering with sleep two time or less a month * Oral steroids no more than once a year * No hospitalizations  2. Chronic rhinitis (dust mite, cat, dog, grass, trees, and ragweed) - Can take as needed: Xyzal (levocetirizine) '5mg'$  tablet once daily - You can use an extra dose of the antihistamine, if needed, for breakthrough symptoms.  - Consider nasal saline rinses 1-2 times daily to remove allergens from the nasal cavities as well as help with mucous clearance (this is especially helpful to do before the nasal sprays are given)  3. Return in about 3 months (around 04/06/2021).   Subjective:   Mason [name] is a 75 y.o. 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 pancreatitis 2015 07/28/2014   Cyst of pancreas 06/22/2014   Acute pancreatitis 04/28/2014   GI bleed 04/14/2014   Anemia 04/14/2014   Sepsis ([place]) 04/08/2014   Abnormal abdominal CT scan 04/07/2014   Right otitis media 04/02/2014   Fever 04/01/2014   History of alcohol use, denies any recent alcohol use 03/26/2014   SOB (shortness of breath) 07/17/2013   Intermittent fever 07/17/2013   Seasonal asthma 07/17/2013   Fever, unspecified 07/11/2013   Constipation 07/11/2013   Cardiomegaly 07/10/2013   Abnormal ultrasound of abdomen 07/09/2013   Abdominal pain, acute, periumbilical [id number]   Nausea & vomiting 07/08/2013   Overweight (BMI 25.0-29.9) 07/08/2013    History obtained from: chart review and patient.  Mason [name] is a 75 y.o. male presenting for a follow up visit.  He is well-known to this practice.  He was last seen in May 2022.  At that time, he was reinstructed on how  to use his Trelegy.  He was continued on 1 puff once daily.  He was also continued on Xolair.  There is concern about dementia since he was lost on the streets.  Social work was consulted.  Since the last visit, he has done well. He is done traveling for a period of time.  Asthma/Respiratory Symptom History: He reports that his breathing is a "B" right now, but it is worse when he is outdoors working in his yard. He wears a mask when he  mows and he has been slacking on the yard. This always makes it hard one his breathing.  He does have the Trelegy that he uses once daily. He sometimes misses it but he tries. He has the albuterol that he uses 2-3 times per day. He has not noticed any improvement in his symptoms with the Xolair.   Allergic Rhinitis Symptom History: He has Xyzal to use as needed.  He has not been using his nasal spray on a routine basis.  He has not needed any antibiotics at all.  As mentioned above, grass seems to be a big trigger.  Otherwise, there have been no changes to his past medical history, surgical history, family history, or social history.    Review of Systems  Constitutional: Negative.  Negative for fever, malaise/fatigue and weight loss.  HENT: Negative.  Negative for congestion, ear discharge and ear pain.   Eyes:  Negative for pain, discharge and redness.  Respiratory:  Positive for cough and shortness of breath. Negative for sputum production and wheezing.   Cardiovascular: Negative.  Negative for chest pain and palpitations.  Gastrointestinal:  Negative for abdominal pain, constipation, diarrhea, heartburn, nausea and vomiting.  Skin: Negative.  Negative for itching and rash.  Neurological:  Negative for dizziness and headaches.  Endo/Heme/Allergies:  Negative for environmental allergies. Does not bruise/bleed easily.      Objective:   Blood pressure 126/76, pulse 82, temperature 98.5 F (36.9 C), temperature source Temporal, height 5' 11.5" (1.816 m), weight 189 lb 6 oz (85.9 kg), SpO2 97 %. Body mass index is 26.04 kg/m.   Physical Exam:  Physical Exam Vitals reviewed.  Constitutional:      Appearance: He is well-developed.  HENT:     Head: Normocephalic and atraumatic.     Right Ear: Tympanic membrane, ear canal and external ear normal.     Left Ear: Tympanic membrane, ear canal and external ear normal.     Nose: No nasal deformity, septal deviation, mucosal edema or  rhinorrhea.     Right Turbinates: Enlarged and swollen.     Left Turbinates: Enlarged and swollen.     Right Sinus: No maxillary sinus tenderness or frontal sinus tenderness.     Left Sinus: No maxillary sinus tenderness or frontal sinus tenderness.     Mouth/Throat:     Mouth: Mucous membranes are not pale and not dry.     Pharynx: Uvula midline.  Eyes:     General: Lids are normal. No allergic shiner.       Right eye: No discharge.        Left eye: No discharge.     Conjunctiva/sclera: Conjunctivae normal.     Right eye: Right conjunctiva is not injected. No chemosis.    Left eye: Left conjunctiva is not injected. No chemosis.    Pupils: Pupils are equal, round, and reactive to light.  Cardiovascular:     Rate and Rhythm: Normal rate and regular rhythm.  Heart sounds: Normal heart sounds.  Pulmonary:     Effort: Pulmonary effort is normal. No tachypnea, accessory muscle usage or respiratory distress.     Breath sounds: Normal breath sounds. No wheezing, rhonchi or rales.     Comments: Diffuse wheezing in all lung fields noted most prominently at the bases. Chest:     Chest wall: No tenderness.  Lymphadenopathy:     Cervical: No cervical adenopathy.  Skin:    General: Skin is warm.     Capillary Refill: Capillary refill takes less than 2 seconds.     Coloration: Skin is not pale.     Findings: No abrasion, erythema, petechiae or rash. Rash is not papular, urticarial or vesicular.  Neurological:     Mental Status: He is alert.  Psychiatric:        Behavior: Behavior is cooperative.     Diagnostic studies:   Spirometry: results abnormal (FEV1: 1.25/40%, FVC: 1.97/46%, FEV1/FVC: 63%).    Spirometry consistent with mixed obstructive and restrictive disease. Overall this is better than it has been as of the last visit.   Allergy Studies: none        Salvatore Marvel, MD  [facility]

## 2021-01-04 NOTE — Patient Instructions (Addendum)
1. Severe persistent asthma with exacerbation - Lung testing actually looked better than last time.  - We are going to add on an albuterol nebulizer to use when your symptoms of shortness of breath become really bad (this is the SAME medication as the long tube with albuterol, but more medication).   We are going to continue with the Xolair for another couple of months to see if this is helping (it should help you to avoid going to the hospital and the ED for wheezing). - Daily controller medication(s): Trelegy 200/62.5/25 1 puff once daily + Xolair every month     - Rescue medications: albuterol (ProAir) 4 puffs every 4-6 hours as needed and albuterol nebulizer solution via nebulizer - Asthma control goals:  * Full participation in all desired activities (may need albuterol before activity) * Albuterol use two time or less a week on average (not counting use with activity) * Cough interfering with sleep two time or less a month * Oral steroids no more than once a year * No hospitalizations  2. Chronic rhinitis (dust mite, cat, dog, grass, trees, and ragweed) - Can take as needed: Xyzal (levocetirizine) '5mg'$  tablet once daily - You can use an extra dose of the antihistamine, if needed, for breakthrough symptoms.  - Consider nasal saline rinses 1-2 times daily to remove allergens from the nasal cavities as well as help with mucous clearance (this is especially helpful to do before the nasal sprays are given)  3. Return in about 3 months (around 04/06/2021).    Please inform us of any Emergency Department visits, hospitalizations, or changes in symptoms. Call us before going to the ED for breathing or allergy symptoms since we might be able to fit you in for a sick visit. Feel free to contact us anytime with any questions, problems, or concerns.  It was a pleasure to see you again today!  Websites that have reliable patient information: 1. American Academy of Asthma, Allergy, and Immunology:  www.aaaai.org 2. Food Allergy Research and Education (FARE): foodallergy.org 3. Mothers of Asthmatics: http://www.asthmacommunitynetwork.org 4. American College of Allergy, Asthma, and Immunology: www.acaai.org   COVID-19 Vaccine Information can be found at: ShippingScam.co.uk For questions related to vaccine distribution or appointments, please email [email or web address] or call [phone].   We realize that you might be concerned about having an allergic reaction to the COVID19 vaccines. To help with that concern, WE ARE OFFERING THE COVID19 VACCINES IN OUR OFFICE! Ask the front desk for dates!     "Like" [place] on Facebook and Instagram for our latest updates!      A healthy democracy works best when [employer] participate! Make sure you are registered to vote! If you have moved or changed any of your contact information, you will need to get this updated before voting!  In some cases, you MAY be able to register to vote online: [email or web address]

## 2021-02-01 ENCOUNTER — Ambulatory Visit: Payer: Medicare Other

## 2021-02-04 DIAGNOSIS — J45998 Other asthma: Secondary | ICD-10-CM | POA: Diagnosis not present

## 2021-03-06 DIAGNOSIS — J45998 Other asthma: Secondary | ICD-10-CM | POA: Diagnosis not present

## 2021-04-06 ENCOUNTER — Other Ambulatory Visit: Payer: Self-pay | Admitting: Gastroenterology

## 2021-04-06 DIAGNOSIS — J45998 Other asthma: Secondary | ICD-10-CM | POA: Diagnosis not present

## 2021-04-07 ENCOUNTER — Ambulatory Visit (INDEPENDENT_AMBULATORY_CARE_PROVIDER_SITE_OTHER): Payer: Medicare Other

## 2021-04-07 ENCOUNTER — Other Ambulatory Visit: Payer: Self-pay

## 2021-04-07 ENCOUNTER — Encounter: Payer: Self-pay | Admitting: Allergy & Immunology

## 2021-04-07 ENCOUNTER — Ambulatory Visit: Payer: Medicare Other | Admitting: Allergy & Immunology

## 2021-04-07 VITALS — BP 110/74 | HR 91 | Temp 96.7°F | Resp 20 | Ht 70.16 in | Wt 186.6 lb

## 2021-04-07 DIAGNOSIS — J302 Other seasonal allergic rhinitis: Secondary | ICD-10-CM | POA: Diagnosis not present

## 2021-04-07 DIAGNOSIS — J4540 Moderate persistent asthma, uncomplicated: Secondary | ICD-10-CM

## 2021-04-07 DIAGNOSIS — J4550 Severe persistent asthma, uncomplicated: Secondary | ICD-10-CM

## 2021-04-07 DIAGNOSIS — J3089 Other allergic rhinitis: Secondary | ICD-10-CM

## 2021-04-07 MED ORDER — ALBUTEROL SULFATE (2.5 MG/3ML) 0.083% IN NEBU: 2.5000 mg | INHALATION_SOLUTION | RESPIRATORY_TRACT | 2 refills | Status: DC | PRN

## 2021-04-07 MED ORDER — TRELEGY ELLIPTA 200-62.5-25 MCG/ACT IN AEPB: 1.0000 | INHALATION_SPRAY | Freq: Every day | RESPIRATORY_TRACT | 5 refills | Status: DC

## 2021-04-07 MED ORDER — LEVOCETIRIZINE DIHYDROCHLORIDE 5 MG PO TABS
5.0000 mg | ORAL_TABLET | Freq: Two times a day (BID) | ORAL | 5 refills | Status: DC | PRN
Start: 2021-04-07 — End: 2023-08-17

## 2021-04-07 MED ORDER — ALBUTEROL SULFATE HFA 108 (90 BASE) MCG/ACT IN AERS: 2.0000 | INHALATION_SPRAY | RESPIRATORY_TRACT | 0 refills | Status: DC | PRN

## 2021-04-07 MED ORDER — EPINEPHRINE 0.3 MG/0.3ML IJ SOAJ: 0.3000 mg | INTRAMUSCULAR | 1 refills | Status: DC | PRN

## 2021-04-07 NOTE — Progress Notes (Signed)
FOLLOW UP  Date of Service/Encounter:  04/07/21   Assessment:   Severe persistent asthma, uncomplicated - previously doing well on Xolair   Perennial and seasonal allergic rhinitis (dust mite, cat, dog, grass, trees, and ragweed)   No smoking history    Fully vaccinated against COVID-19    ? dementia - although seemed fairly lucid today    Plan/Recommendations:   1. Severe persistent asthma with exacerbation - Lung testing looks a little worse. - You are wheezing today, but I am not going to add prednisone since you are getting the flu shot next week.  - We restarted the Xolair today (come back MONTHLY for this).  - Unfortunately, we do NOT have the high dose influenza vaccines here, so you will need to go to either your PCP or a drug store.  - Daily controller medication(s): Trelegy 200/62.5/25 1 puff once daily + Xolair every month   - Rescue medications: albuterol (ProAir) 4 puffs every 4-6 hours as needed and albuterol nebulizer solution via nebulizer - Asthma control goals:  * Full participation in all desired activities (may need albuterol before activity) * Albuterol use two time or less a week on average (not counting use with activity) * Cough interfering with sleep two time or less a month * Oral steroids no more than once a year * No hospitalizations  2. Chronic rhinitis (dust mite, cat, dog, grass, trees, and ragweed) - Can take as needed: Xyzal (levocetirizine) 5mg  tablet once daily - You can use an extra dose of the antihistamine, if needed, for breakthrough symptoms.  - Consider nasal saline rinses 1-2 times daily to remove allergens from the nasal cavities as well as help with mucous clearance (this is especially helpful to do before the nasal sprays are given)  3. Return in about 3 months (around 07/08/2021).    Subjective:   Mason [name] is a 75 y.o. male presenting today for follow up of  Chief Complaint  Patient presents with   Asthma    Act-  8 Had some flare up when he is working in the yard doing yard work. He can only do this type of work for 30 minutes before he has to take a break.    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 pancreatitis 2015 07/28/2014   Cyst of pancreas 06/22/2014   Acute pancreatitis 04/28/2014   GI bleed 04/14/2014   Anemia 04/14/2014   Sepsis ([place]) 04/08/2014   Abnormal abdominal CT scan 04/07/2014   Right otitis media 04/02/2014   Fever 04/01/2014   History of alcohol use, denies any recent alcohol use 03/26/2014   SOB (shortness of breath) 07/17/2013   Intermittent fever 07/17/2013   Seasonal asthma 07/17/2013   Fever, unspecified 07/11/2013   Constipation 07/11/2013   Cardiomegaly 07/10/2013   Abnormal ultrasound of abdomen 07/09/2013   Abdominal pain, acute, periumbilical 43/15/4008   Nausea & vomiting 07/08/2013   Overweight (BMI 25.0-29.9) 07/08/2013    History obtained from: chart review and patient.  Mason [name] is a 75 y.o. male presenting for a follow up visit. He was last in August 2022.  At that time, his lung testing looked better than previous ones.  We added on an albuterol nebulizer to use when he has shortness of breath episodes.  We wanted to continue with Xolair, but he has not done that yet.  We also continue with Trelegy 1 puff once daily.  For his allergic rhinitis, we continue with Xyzal 5 mg daily.   Since the last visit, he has mostly done well.    Asthma/Respiratory Symptom History: He  did have some issues over the last week or so with increasing raking outdoors and the leaves.  He uses the Trelegy at least one puff once daily. He has the rescue inhaler that he uses around once per day.  He is interested in restarting his Xolair.  He felt that his breathing was under better control when he was on this.  He has not needed prednisone.    Allergic Rhinitis Symptom History: He has the Xyzal and the Flonase that he uses on a as needed basis.  Overall, he reports that his allergies are under good control.  He has not needed any antibiotics for sinus infections.  His main symptoms seem to be when he is outdoors specifically when he is raking leaves or doing yard work.   He is up-to-date on his COVID vaccines.  He does need to get a flu shot this year.  His wife is doing well.  They no longer do much in the way of traveling.   Otherwise, there have been no changes to his past medical history, surgical history, family history, or social history.    Review of Systems  Constitutional: Negative.  Negative for fever, malaise/fatigue and weight loss.  HENT: Negative.  Negative for congestion, ear discharge and ear pain.   Eyes:  Negative for pain, discharge and redness.  Respiratory:  Positive for cough, shortness of breath and wheezing. Negative for sputum production.   Cardiovascular: Negative.  Negative for chest pain and palpitations.  Gastrointestinal:  Negative for abdominal pain, heartburn, nausea and vomiting.  Skin: Negative.  Negative for itching and rash.  Neurological:  Negative for dizziness and headaches.  Endo/Heme/Allergies:  Negative for environmental allergies. Does not bruise/bleed easily.      Objective:   Blood pressure 110/74, pulse 91, temperature (!) 96.7 F (35.9 C), temperature source Temporal, resp. rate 20, height 5' 10.16" (1.782 m), weight 186 lb 9.6 oz (84.6 kg), SpO2 94 %. Body mass index is 26.65 kg/m.   Physical Exam Vitals reviewed.   Constitutional:      Appearance: He is well-developed.  HENT:     Head: Normocephalic and atraumatic.     Right Ear: Tympanic membrane, ear canal and external ear normal.     Left Ear: Tympanic membrane, ear canal and external ear normal.     Nose: No nasal deformity, septal deviation, mucosal edema or rhinorrhea.     Right Turbinates: Enlarged and swollen.     Left Turbinates: Enlarged and swollen.     Right Sinus: No maxillary sinus tenderness or frontal sinus tenderness.     Left Sinus: No maxillary sinus tenderness or frontal sinus tenderness.     Mouth/Throat:     Mouth: Mucous membranes are not pale and not dry.     Pharynx: Uvula midline.  Eyes:     General: Lids are normal. No allergic shiner.  Right eye: No discharge.        Left eye: No discharge.     Conjunctiva/sclera: Conjunctivae normal.     Right eye: Right conjunctiva is not injected. No chemosis.    Left eye: Left conjunctiva is not injected. No chemosis.    Pupils: Pupils are equal, round, and reactive to light.  Cardiovascular:     Rate and Rhythm: Normal rate and regular rhythm.     Heart sounds: Normal heart sounds.  Pulmonary:     Effort: Pulmonary effort is normal. No tachypnea, accessory muscle usage or respiratory distress.     Breath sounds: Normal breath sounds. No rales.  Wheezing bilaterally in all lung fields.  No tachypnea. Chest:     Chest wall: No tenderness.  Lymphadenopathy:     Cervical: No cervical adenopathy.  Skin:    General: Skin is warm.     Capillary Refill: Capillary refill takes less than 2 seconds.     Coloration: Skin is not pale.     Findings: No abrasion, erythema, petechiae or rash. Rash is not papular, urticarial or vesicular.     Comments: No eczematous or urticarial lesions noted.  Neurological:     Mental Status: He is alert.  Psychiatric:        Behavior: Behavior is cooperative.      Diagnostic studies:    Spirometry: results abnormal (FEV1: 1.07/41%, FVC:  1.67/49%, FEV1/FVC: 64%).   Spirometry consistent with severe obstructive disease. Overall values are lower compared to previous spirometries.        Salvatore Marvel, MD  [facility] and [facility]

## 2021-04-07 NOTE — Patient Instructions (Addendum)
1. Severe persistent asthma with exacerbation - Lung testing looks a little worse. - You are wheezing today, but I am not going to add prednisone since you are getting the flu shot next week.  - We restarted the Xolair today (come back MONTHLY for this).  - Unfortunately, we do NOT have the high dose influenza vaccines here, so you will need to go to either your PCP or a drug store.  - Daily controller medication(s): Trelegy 200/62.5/25 1 puff once daily + Xolair every month     - Rescue medications: albuterol (ProAir) 4 puffs every 4-6 hours as needed and albuterol nebulizer solution via nebulizer - Asthma control goals:  * Full participation in all desired activities (may need albuterol before activity) * Albuterol use two time or less a week on average (not counting use with activity) * Cough interfering with sleep two time or less a month * Oral steroids no more than once a year * No hospitalizations  2. Chronic rhinitis (dust mite, cat, dog, grass, trees, and ragweed) - Can take as needed: Xyzal (levocetirizine) 5mg  tablet once daily - You can use an extra dose of the antihistamine, if needed, for breakthrough symptoms.  - Consider nasal saline rinses 1-2 times daily to remove allergens from the nasal cavities as well as help with mucous clearance (this is especially helpful to do before the nasal sprays are given)  3. Return in about 3 months (around 07/08/2021).    Please inform us of any Emergency Department visits, hospitalizations, or changes in symptoms. Call us before going to the ED for breathing or allergy symptoms since we might be able to fit you in for a sick visit. Feel free to contact us anytime with any questions, problems, or concerns.  It was a pleasure to see you again today!  Websites that have reliable patient information: 1. American Academy of Asthma, Allergy, and Immunology: www.aaaai.org 2. Food Allergy Research and Education (FARE): foodallergy.org 3.  Mothers of Asthmatics: http://www.asthmacommunitynetwork.org 4. American College of Allergy, Asthma, and Immunology: www.acaai.org   COVID-19 Vaccine Information can be found at: ShippingScam.co.uk For questions related to vaccine distribution or appointments, please email [email or web address] or call [phone].   We realize that you might be concerned about having an allergic reaction to the COVID19 vaccines. To help with that concern, WE ARE OFFERING THE COVID19 VACCINES IN OUR OFFICE! Ask the front desk for dates!     "Like" [place] on Facebook and Instagram for our latest updates!      A healthy democracy works best when [employer] participate! Make sure you are registered to vote! If you have moved or changed any of your contact information, you will need to get this updated before voting!  In some cases, you MAY be able to register to vote online: [email or web address]

## 2021-04-28 DIAGNOSIS — R0902 Hypoxemia: Secondary | ICD-10-CM | POA: Diagnosis not present

## 2021-04-28 DIAGNOSIS — I10 Essential (primary) hypertension: Secondary | ICD-10-CM | POA: Diagnosis not present

## 2021-04-28 NOTE — Progress Notes (Signed)
Attempted to obtain medical history via telephone, unable to reach at this time.Voicemail full unable to leave message.

## 2021-05-05 ENCOUNTER — Ambulatory Visit: Payer: Medicare Other

## 2021-05-05 ENCOUNTER — Other Ambulatory Visit: Payer: Self-pay | Admitting: Gastroenterology

## 2021-05-06 DIAGNOSIS — J45998 Other asthma: Secondary | ICD-10-CM | POA: Diagnosis not present

## 2021-05-09 ENCOUNTER — Other Ambulatory Visit: Payer: Self-pay

## 2021-05-09 ENCOUNTER — Inpatient Hospital Stay (HOSPITAL_COMMUNITY)
Admission: EM | Admit: 2021-05-09 | Discharge: 2021-05-13 | DRG: 202 | Disposition: A | Discharge status: Home / Self Care | Payer: Medicare Other | Attending: Family Medicine | Admitting: Family Medicine

## 2021-05-09 ENCOUNTER — Emergency Department (HOSPITAL_COMMUNITY): Payer: Medicare Other

## 2021-05-09 ENCOUNTER — Encounter (HOSPITAL_COMMUNITY): Payer: Self-pay | Admitting: Emergency Medicine

## 2021-05-09 DIAGNOSIS — J302 Other seasonal allergic rhinitis: Secondary | ICD-10-CM | POA: Diagnosis present

## 2021-05-09 DIAGNOSIS — E8729 Other acidosis: Secondary | ICD-10-CM | POA: Diagnosis not present

## 2021-05-09 DIAGNOSIS — Z86010 Personal history of colonic polyps: Secondary | ICD-10-CM

## 2021-05-09 DIAGNOSIS — R739 Hyperglycemia, unspecified: Secondary | ICD-10-CM

## 2021-05-09 DIAGNOSIS — R062 Wheezing: Secondary | ICD-10-CM | POA: Diagnosis not present

## 2021-05-09 DIAGNOSIS — J4551 Severe persistent asthma with (acute) exacerbation: Secondary | ICD-10-CM | POA: Diagnosis not present

## 2021-05-09 DIAGNOSIS — R0902 Hypoxemia: Secondary | ICD-10-CM | POA: Diagnosis not present

## 2021-05-09 DIAGNOSIS — R000 Tachycardia, unspecified: Secondary | ICD-10-CM

## 2021-05-09 DIAGNOSIS — A419 Sepsis, unspecified organism: Secondary | ICD-10-CM | POA: Diagnosis not present

## 2021-05-09 DIAGNOSIS — E785 Hyperlipidemia, unspecified: Secondary | ICD-10-CM | POA: Diagnosis not present

## 2021-05-09 DIAGNOSIS — K219 Gastro-esophageal reflux disease without esophagitis: Secondary | ICD-10-CM | POA: Diagnosis not present

## 2021-05-09 DIAGNOSIS — K921 Melena: Secondary | ICD-10-CM | POA: Diagnosis not present

## 2021-05-09 DIAGNOSIS — Z20822 Contact with and (suspected) exposure to covid-19: Secondary | ICD-10-CM | POA: Diagnosis present

## 2021-05-09 DIAGNOSIS — Z743 Need for continuous supervision: Secondary | ICD-10-CM | POA: Diagnosis not present

## 2021-05-09 DIAGNOSIS — J9602 Acute respiratory failure with hypercapnia: Secondary | ICD-10-CM | POA: Diagnosis present

## 2021-05-09 DIAGNOSIS — T17810A Gastric contents in other parts of respiratory tract causing asphyxiation, initial encounter: Secondary | ICD-10-CM | POA: Diagnosis not present

## 2021-05-09 DIAGNOSIS — J4552 Severe persistent asthma with status asthmaticus: Secondary | ICD-10-CM | POA: Diagnosis not present

## 2021-05-09 DIAGNOSIS — D125 Benign neoplasm of sigmoid colon: Secondary | ICD-10-CM | POA: Diagnosis present

## 2021-05-09 DIAGNOSIS — J4541 Moderate persistent asthma with (acute) exacerbation: Secondary | ICD-10-CM | POA: Diagnosis not present

## 2021-05-09 DIAGNOSIS — R0602 Shortness of breath: Secondary | ICD-10-CM | POA: Diagnosis not present

## 2021-05-09 DIAGNOSIS — Z79899 Other long term (current) drug therapy: Secondary | ICD-10-CM | POA: Diagnosis not present

## 2021-05-09 DIAGNOSIS — J309 Allergic rhinitis, unspecified: Secondary | ICD-10-CM | POA: Diagnosis present

## 2021-05-09 DIAGNOSIS — J301 Allergic rhinitis due to pollen: Secondary | ICD-10-CM | POA: Diagnosis not present

## 2021-05-09 DIAGNOSIS — R0600 Dyspnea, unspecified: Secondary | ICD-10-CM | POA: Diagnosis not present

## 2021-05-09 DIAGNOSIS — N179 Acute kidney failure, unspecified: Secondary | ICD-10-CM

## 2021-05-09 DIAGNOSIS — J9601 Acute respiratory failure with hypoxia: Secondary | ICD-10-CM | POA: Diagnosis not present

## 2021-05-09 DIAGNOSIS — J45901 Unspecified asthma with (acute) exacerbation: Secondary | ICD-10-CM | POA: Diagnosis present

## 2021-05-09 DIAGNOSIS — K648 Other hemorrhoids: Secondary | ICD-10-CM | POA: Diagnosis not present

## 2021-05-09 DIAGNOSIS — Z7982 Long term (current) use of aspirin: Secondary | ICD-10-CM | POA: Diagnosis not present

## 2021-05-09 DIAGNOSIS — Z9049 Acquired absence of other specified parts of digestive tract: Secondary | ICD-10-CM

## 2021-05-09 DIAGNOSIS — J45909 Unspecified asthma, uncomplicated: Secondary | ICD-10-CM | POA: Diagnosis not present

## 2021-05-09 DIAGNOSIS — I471 Supraventricular tachycardia: Secondary | ICD-10-CM | POA: Diagnosis not present

## 2021-05-09 DIAGNOSIS — K635 Polyp of colon: Secondary | ICD-10-CM | POA: Diagnosis not present

## 2021-05-09 DIAGNOSIS — Y92239 Unspecified place in hospital as the place of occurrence of the external cause: Secondary | ICD-10-CM | POA: Diagnosis not present

## 2021-05-09 DIAGNOSIS — F419 Anxiety disorder, unspecified: Secondary | ICD-10-CM | POA: Diagnosis present

## 2021-05-09 DIAGNOSIS — T17908A Unspecified foreign body in respiratory tract, part unspecified causing other injury, initial encounter: Secondary | ICD-10-CM

## 2021-05-09 DIAGNOSIS — K625 Hemorrhage of anus and rectum: Secondary | ICD-10-CM | POA: Diagnosis not present

## 2021-05-09 DIAGNOSIS — T380X5A Adverse effect of glucocorticoids and synthetic analogues, initial encounter: Secondary | ICD-10-CM | POA: Diagnosis not present

## 2021-05-09 DIAGNOSIS — J4530 Mild persistent asthma, uncomplicated: Secondary | ICD-10-CM | POA: Diagnosis not present

## 2021-05-09 DIAGNOSIS — K5730 Diverticulosis of large intestine without perforation or abscess without bleeding: Secondary | ICD-10-CM | POA: Diagnosis not present

## 2021-05-09 MED ORDER — LORAZEPAM 2 MG/ML IJ SOLN
1.0000 mg | Freq: Once | INTRAMUSCULAR | Status: AC
  Administered 2021-05-09: 1 mg via INTRAVENOUS
  Filled 2021-05-09: qty 1

## 2021-05-09 MED ORDER — ALBUTEROL (5 MG/ML) CONTINUOUS INHALATION SOLN: 15.0000 mg/h | INHALATION_SOLUTION | Freq: Once | RESPIRATORY_TRACT | Status: DC

## 2021-05-09 MED ORDER — ALBUTEROL SULFATE (2.5 MG/3ML) 0.083% IN NEBU
15.0000 mg | INHALATION_SOLUTION | Freq: Once | RESPIRATORY_TRACT | Status: AC
  Administered 2021-05-09: 15 mg via RESPIRATORY_TRACT
  Filled 2021-05-09 (×2): qty 18

## 2021-05-09 NOTE — ED Triage Notes (Signed)
Pt arrived via EMS from home. Pt has shortness of breath and wheezing in all lung fields. Pt has an inhaler at home, but is normally on room air at home. Pt is receiving his second albuterol nebulizer treatment by EMS. Pt reports no chest pain, just shortness of breath. Pt was given 125 of solu-medrol, 2g of magnesium IV, 10mg  of albuterol, 0.5 of atrovent, and 0.3mg  of IM epinephrine

## 2021-05-09 NOTE — ED Notes (Signed)
Respiratory contacted per RN request

## 2021-05-10 ENCOUNTER — Encounter (HOSPITAL_COMMUNITY)
Admission: EM | Disposition: A | Discharge status: Home / Self Care | Payer: Self-pay | Source: Home / Self Care | Attending: Family Medicine

## 2021-05-10 ENCOUNTER — Ambulatory Visit (HOSPITAL_COMMUNITY): Admission: RE | Admit: 2021-05-10 | Payer: Medicare Other | Source: Home / Self Care | Admitting: Gastroenterology

## 2021-05-10 ENCOUNTER — Encounter (HOSPITAL_COMMUNITY): Payer: Self-pay | Admitting: Internal Medicine

## 2021-05-10 DIAGNOSIS — Z7982 Long term (current) use of aspirin: Secondary | ICD-10-CM | POA: Diagnosis not present

## 2021-05-10 DIAGNOSIS — J4541 Moderate persistent asthma with (acute) exacerbation: Secondary | ICD-10-CM | POA: Diagnosis not present

## 2021-05-10 DIAGNOSIS — T380X5A Adverse effect of glucocorticoids and synthetic analogues, initial encounter: Secondary | ICD-10-CM | POA: Diagnosis not present

## 2021-05-10 DIAGNOSIS — J302 Other seasonal allergic rhinitis: Secondary | ICD-10-CM | POA: Diagnosis present

## 2021-05-10 DIAGNOSIS — K635 Polyp of colon: Secondary | ICD-10-CM | POA: Diagnosis not present

## 2021-05-10 DIAGNOSIS — J9602 Acute respiratory failure with hypercapnia: Secondary | ICD-10-CM | POA: Diagnosis present

## 2021-05-10 DIAGNOSIS — K219 Gastro-esophageal reflux disease without esophagitis: Secondary | ICD-10-CM | POA: Diagnosis present

## 2021-05-10 DIAGNOSIS — Z79899 Other long term (current) drug therapy: Secondary | ICD-10-CM | POA: Diagnosis not present

## 2021-05-10 DIAGNOSIS — J9601 Acute respiratory failure with hypoxia: Secondary | ICD-10-CM | POA: Diagnosis present

## 2021-05-10 DIAGNOSIS — K625 Hemorrhage of anus and rectum: Secondary | ICD-10-CM | POA: Diagnosis not present

## 2021-05-10 DIAGNOSIS — R0602 Shortness of breath: Secondary | ICD-10-CM

## 2021-05-10 DIAGNOSIS — J4551 Severe persistent asthma with (acute) exacerbation: Secondary | ICD-10-CM | POA: Diagnosis not present

## 2021-05-10 DIAGNOSIS — J4530 Mild persistent asthma, uncomplicated: Secondary | ICD-10-CM | POA: Diagnosis not present

## 2021-05-10 DIAGNOSIS — N179 Acute kidney failure, unspecified: Secondary | ICD-10-CM | POA: Diagnosis present

## 2021-05-10 DIAGNOSIS — R739 Hyperglycemia, unspecified: Secondary | ICD-10-CM | POA: Diagnosis not present

## 2021-05-10 DIAGNOSIS — J309 Allergic rhinitis, unspecified: Secondary | ICD-10-CM | POA: Diagnosis present

## 2021-05-10 DIAGNOSIS — Z20822 Contact with and (suspected) exposure to covid-19: Secondary | ICD-10-CM | POA: Diagnosis present

## 2021-05-10 DIAGNOSIS — J45909 Unspecified asthma, uncomplicated: Secondary | ICD-10-CM | POA: Diagnosis not present

## 2021-05-10 DIAGNOSIS — E785 Hyperlipidemia, unspecified: Secondary | ICD-10-CM | POA: Diagnosis not present

## 2021-05-10 DIAGNOSIS — J301 Allergic rhinitis due to pollen: Secondary | ICD-10-CM | POA: Diagnosis not present

## 2021-05-10 DIAGNOSIS — I471 Supraventricular tachycardia: Secondary | ICD-10-CM | POA: Diagnosis not present

## 2021-05-10 DIAGNOSIS — Y92239 Unspecified place in hospital as the place of occurrence of the external cause: Secondary | ICD-10-CM | POA: Diagnosis not present

## 2021-05-10 DIAGNOSIS — D125 Benign neoplasm of sigmoid colon: Secondary | ICD-10-CM | POA: Diagnosis not present

## 2021-05-10 DIAGNOSIS — K921 Melena: Secondary | ICD-10-CM | POA: Diagnosis not present

## 2021-05-10 DIAGNOSIS — Z9049 Acquired absence of other specified parts of digestive tract: Secondary | ICD-10-CM | POA: Diagnosis not present

## 2021-05-10 DIAGNOSIS — Z86010 Personal history of colonic polyps: Secondary | ICD-10-CM | POA: Diagnosis not present

## 2021-05-10 DIAGNOSIS — F419 Anxiety disorder, unspecified: Secondary | ICD-10-CM | POA: Diagnosis present

## 2021-05-10 DIAGNOSIS — J45901 Unspecified asthma with (acute) exacerbation: Secondary | ICD-10-CM | POA: Diagnosis present

## 2021-05-10 DIAGNOSIS — A419 Sepsis, unspecified organism: Secondary | ICD-10-CM | POA: Diagnosis not present

## 2021-05-10 DIAGNOSIS — E8729 Other acidosis: Secondary | ICD-10-CM | POA: Diagnosis present

## 2021-05-10 DIAGNOSIS — K648 Other hemorrhoids: Secondary | ICD-10-CM | POA: Diagnosis not present

## 2021-05-10 DIAGNOSIS — K5730 Diverticulosis of large intestine without perforation or abscess without bleeding: Secondary | ICD-10-CM | POA: Diagnosis present

## 2021-05-10 DIAGNOSIS — T17810A Gastric contents in other parts of respiratory tract causing asphyxiation, initial encounter: Secondary | ICD-10-CM | POA: Diagnosis not present

## 2021-05-10 LAB — CBC WITH DIFFERENTIAL/PLATELET
Abs Immature Granulocytes: 0.02 10*3/uL (ref 0.00–0.07)
Abs Immature Granulocytes: 0.08 10*3/uL — ABNORMAL HIGH (ref 0.00–0.07)
Basophils Absolute: 0 10*3/uL (ref 0.0–0.1)
Basophils Absolute: 0.1 10*3/uL (ref 0.0–0.1)
Basophils Relative: 0 %
Basophils Relative: 1 %
Eosinophils Absolute: 0 10*3/uL (ref 0.0–0.5)
Eosinophils Absolute: 0 10*3/uL (ref 0.0–0.5)
Eosinophils Relative: 0 %
Eosinophils Relative: 0 %
HCT: 49.2 % (ref 39.0–52.0)
HCT: 51.6 % (ref 39.0–52.0)
Hemoglobin: 16.6 g/dL (ref 13.0–17.0)
Hemoglobin: 17.6 g/dL — ABNORMAL HIGH (ref 13.0–17.0)
Immature Granulocytes: 0 %
Immature Granulocytes: 1 %
Lymphocytes Relative: 19 %
Lymphocytes Relative: 6 %
Lymphs Abs: 0.6 10*3/uL — ABNORMAL LOW (ref 0.7–4.0)
Lymphs Abs: 1.7 10*3/uL (ref 0.7–4.0)
MCH: 33.7 pg (ref 26.0–34.0)
MCH: 34.3 pg — ABNORMAL HIGH (ref 26.0–34.0)
MCHC: 33.7 g/dL (ref 30.0–36.0)
MCHC: 34.1 g/dL (ref 30.0–36.0)
MCV: 100.6 fL — ABNORMAL HIGH (ref 80.0–100.0)
MCV: 99.8 fL (ref 80.0–100.0)
Monocytes Absolute: 0.3 10*3/uL (ref 0.1–1.0)
Monocytes Absolute: 0.7 10*3/uL (ref 0.1–1.0)
Monocytes Relative: 3 %
Monocytes Relative: 8 %
Neutro Abs: 6.2 10*3/uL (ref 1.7–7.7)
Neutro Abs: 9.4 10*3/uL — ABNORMAL HIGH (ref 1.7–7.7)
Neutrophils Relative %: 72 %
Neutrophils Relative %: 90 %
Platelets: 191 10*3/uL (ref 150–400)
Platelets: 267 10*3/uL (ref 150–400)
RBC: 4.93 MIL/uL (ref 4.22–5.81)
RBC: 5.13 MIL/uL (ref 4.22–5.81)
RDW: 12.5 % (ref 11.5–15.5)
RDW: 12.9 % (ref 11.5–15.5)
WBC: 10.4 10*3/uL (ref 4.0–10.5)
WBC: 8.7 10*3/uL (ref 4.0–10.5)
nRBC: 0 % (ref 0.0–0.2)
nRBC: 0 % (ref 0.0–0.2)

## 2021-05-10 LAB — BLOOD GAS, VENOUS
Acid-base deficit: 4.3 mmol/L — ABNORMAL HIGH (ref 0.0–2.0)
Acid-base deficit: 5.8 mmol/L — ABNORMAL HIGH (ref 0.0–2.0)
Acid-base deficit: 6.4 mmol/L — ABNORMAL HIGH (ref 0.0–2.0)
Bicarbonate: 19.5 mmol/L — ABNORMAL LOW (ref 20.0–28.0)
Bicarbonate: 21.9 mmol/L (ref 20.0–28.0)
Bicarbonate: 25 mmol/L (ref 20.0–28.0)
FIO2: 21
O2 Saturation: 75.6 %
O2 Saturation: 86.8 %
O2 Saturation: 91.3 %
Patient temperature: 98.6
Patient temperature: 98.6
Patient temperature: 98.6
pCO2, Ven: 41.9 mmHg — ABNORMAL LOW (ref 44.0–60.0)
pCO2, Ven: 52 mmHg (ref 44.0–60.0)
pCO2, Ven: 63.3 mmHg — ABNORMAL HIGH (ref 44.0–60.0)
pH, Ven: 7.221 — ABNORMAL LOW (ref 7.250–7.430)
pH, Ven: 7.248 — ABNORMAL LOW (ref 7.250–7.430)
pH, Ven: 7.289 (ref 7.250–7.430)
pO2, Ven: 48.1 mmHg — ABNORMAL HIGH (ref 32.0–45.0)
pO2, Ven: 61.7 mmHg — ABNORMAL HIGH (ref 32.0–45.0)
pO2, Ven: 77.4 mmHg — ABNORMAL HIGH (ref 32.0–45.0)

## 2021-05-10 LAB — RESP PANEL BY RT-PCR (FLU A&B, COVID) ARPGX2
Influenza A by PCR: NEGATIVE
Influenza B by PCR: NEGATIVE
SARS Coronavirus 2 by RT PCR: NEGATIVE

## 2021-05-10 LAB — COMPREHENSIVE METABOLIC PANEL
ALT: 33 U/L (ref 0–44)
ALT: 34 U/L (ref 0–44)
AST: 42 U/L — ABNORMAL HIGH (ref 15–41)
AST: 43 U/L — ABNORMAL HIGH (ref 15–41)
Albumin: 3.9 g/dL (ref 3.5–5.0)
Albumin: 4.3 g/dL (ref 3.5–5.0)
Alkaline Phosphatase: 69 U/L (ref 38–126)
Alkaline Phosphatase: 77 U/L (ref 38–126)
Anion gap: 10 (ref 5–15)
Anion gap: 7 (ref 5–15)
BUN: 18 mg/dL (ref 8–23)
BUN: 18 mg/dL (ref 8–23)
CO2: 21 mmol/L — ABNORMAL LOW (ref 22–32)
CO2: 23 mmol/L (ref 22–32)
Calcium: 9 mg/dL (ref 8.9–10.3)
Calcium: 9 mg/dL (ref 8.9–10.3)
Chloride: 109 mmol/L (ref 98–111)
Chloride: 109 mmol/L (ref 98–111)
Creatinine, Ser: 1.31 mg/dL — ABNORMAL HIGH (ref 0.61–1.24)
Creatinine, Ser: 1.37 mg/dL — ABNORMAL HIGH (ref 0.61–1.24)
GFR, Estimated: 54 mL/min — ABNORMAL LOW (ref >60)
GFR, Estimated: 57 mL/min — ABNORMAL LOW (ref >60)
Glucose, Bld: 136 mg/dL — ABNORMAL HIGH (ref 70–99)
Glucose, Bld: 162 mg/dL — ABNORMAL HIGH (ref 70–99)
Potassium: 4.1 mmol/L (ref 3.5–5.1)
Potassium: 4.2 mmol/L (ref 3.5–5.1)
Sodium: 139 mmol/L (ref 135–145)
Sodium: 140 mmol/L (ref 135–145)
Total Bilirubin: 0.7 mg/dL (ref 0.3–1.2)
Total Bilirubin: 1 mg/dL (ref 0.3–1.2)
Total Protein: 7.3 g/dL (ref 6.5–8.1)
Total Protein: 8 g/dL (ref 6.5–8.1)

## 2021-05-10 LAB — PROCALCITONIN: Procalcitonin: <0.1 ng/mL

## 2021-05-10 LAB — MRSA NEXT GEN BY PCR, NASAL: MRSA by PCR Next Gen: NOT DETECTED

## 2021-05-10 LAB — MAGNESIUM
Magnesium: 3.2 mg/dL — ABNORMAL HIGH (ref 1.7–2.4)
Magnesium: 2.3 mg/dL (ref 1.7–2.4)

## 2021-05-10 LAB — C DIFFICILE QUICK SCREEN W PCR REFLEX
C Diff antigen: NEGATIVE
C Diff interpretation: NOT DETECTED
C Diff toxin: NEGATIVE

## 2021-05-10 LAB — PHOSPHORUS: Phosphorus: 3.4 mg/dL (ref 2.5–4.6)

## 2021-05-10 SURGERY — SIGMOIDOSCOPY, FLEXIBLE
Anesthesia: Monitor Anesthesia Care

## 2021-05-10 MED ORDER — METHYLPREDNISOLONE SODIUM SUCC 125 MG IJ SOLR
80.0000 mg | Freq: Two times a day (BID) | INTRAMUSCULAR | Status: DC
  Administered 2021-05-10: 07:00:00 80 mg via INTRAVENOUS
  Filled 2021-05-10: qty 2

## 2021-05-10 MED ORDER — METHYLPREDNISOLONE SODIUM SUCC 40 MG IJ SOLR
40.0000 mg | Freq: Two times a day (BID) | INTRAMUSCULAR | Status: DC
  Administered 2021-05-10 – 2021-05-11 (×2): 40 mg via INTRAVENOUS
  Filled 2021-05-10 (×2): qty 1

## 2021-05-10 MED ORDER — IPRATROPIUM-ALBUTEROL 0.5-2.5 (3) MG/3ML IN SOLN
3.0000 mL | Freq: Four times a day (QID) | RESPIRATORY_TRACT | Status: DC
  Administered 2021-05-10 (×3): 3 mL via RESPIRATORY_TRACT
  Filled 2021-05-10 (×3): qty 3

## 2021-05-10 MED ORDER — ALBUTEROL (5 MG/ML) CONTINUOUS INHALATION SOLN: 15.0000 mg/h | INHALATION_SOLUTION | Freq: Once | RESPIRATORY_TRACT | Status: DC

## 2021-05-10 MED ORDER — ALBUTEROL SULFATE (2.5 MG/3ML) 0.083% IN NEBU
2.5000 mg | INHALATION_SOLUTION | RESPIRATORY_TRACT | Status: DC | PRN
  Administered 2021-05-10 – 2021-05-13 (×5): 2.5 mg via RESPIRATORY_TRACT
  Filled 2021-05-10 (×4): qty 3

## 2021-05-10 MED ORDER — ONDANSETRON HCL 4 MG/2ML IJ SOLN
4.0000 mg | Freq: Four times a day (QID) | INTRAMUSCULAR | Status: DC | PRN
  Administered 2021-05-10: 19:00:00 4 mg via INTRAVENOUS
  Filled 2021-05-10: qty 2

## 2021-05-10 MED ORDER — HALOPERIDOL LACTATE 5 MG/ML IJ SOLN: 2.0000 mg | Freq: Four times a day (QID) | INTRAMUSCULAR | Status: DC | PRN

## 2021-05-10 MED ORDER — ORAL CARE MOUTH RINSE
15.0000 mL | Freq: Two times a day (BID) | OROMUCOSAL | Status: DC
  Administered 2021-05-10 – 2021-05-12 (×5): 15 mL via OROMUCOSAL

## 2021-05-10 MED ORDER — IPRATROPIUM BROMIDE 0.02 % IN SOLN
0.5000 mg | Freq: Once | RESPIRATORY_TRACT | Status: AC
  Administered 2021-05-10: 02:00:00 0.5 mg via RESPIRATORY_TRACT
  Filled 2021-05-10: qty 2.5

## 2021-05-10 MED ORDER — ALPRAZOLAM 0.5 MG PO TABS
0.5000 mg | ORAL_TABLET | Freq: Two times a day (BID) | ORAL | Status: DC | PRN
  Administered 2021-05-10 – 2021-05-11 (×2): 0.5 mg via ORAL
  Filled 2021-05-10 (×2): qty 1

## 2021-05-10 MED ORDER — ALBUTEROL SULFATE (2.5 MG/3ML) 0.083% IN NEBU
15.0000 mg | INHALATION_SOLUTION | Freq: Once | RESPIRATORY_TRACT | Status: AC
  Administered 2021-05-10: 02:00:00 15 mg via RESPIRATORY_TRACT
  Filled 2021-05-10: qty 18

## 2021-05-10 MED ORDER — ACETAMINOPHEN 325 MG PO TABS: 650.0000 mg | ORAL_TABLET | Freq: Four times a day (QID) | ORAL | Status: DC | PRN

## 2021-05-10 MED ORDER — CHLORHEXIDINE GLUCONATE CLOTH 2 % EX PADS
6.0000 | MEDICATED_PAD | Freq: Every day | CUTANEOUS | Status: DC
  Administered 2021-05-10 – 2021-05-11 (×2): 6 via TOPICAL

## 2021-05-10 MED ORDER — LACTATED RINGERS IV SOLN: INTRAVENOUS | Status: DC

## 2021-05-10 MED ORDER — IPRATROPIUM-ALBUTEROL 0.5-2.5 (3) MG/3ML IN SOLN
3.0000 mL | Freq: Three times a day (TID) | RESPIRATORY_TRACT | Status: DC
  Administered 2021-05-11 – 2021-05-12 (×4): 3 mL via RESPIRATORY_TRACT
  Filled 2021-05-10 (×4): qty 3

## 2021-05-10 MED ORDER — ACETAMINOPHEN 650 MG RE SUPP: 650.0000 mg | Freq: Four times a day (QID) | RECTAL | Status: DC | PRN

## 2021-05-10 MED ORDER — CHLORHEXIDINE GLUCONATE 0.12 % MT SOLN
15.0000 mL | Freq: Two times a day (BID) | OROMUCOSAL | Status: DC
  Administered 2021-05-10 – 2021-05-12 (×6): 15 mL via OROMUCOSAL
  Filled 2021-05-10 (×6): qty 15

## 2021-05-10 MED ORDER — QUETIAPINE FUMARATE 25 MG PO TABS
25.0000 mg | ORAL_TABLET | Freq: Every day | ORAL | Status: DC
  Administered 2021-05-10 – 2021-05-12 (×3): 25 mg via ORAL
  Filled 2021-05-10 (×3): qty 1

## 2021-05-10 MED ORDER — LORATADINE 10 MG PO TABS
10.0000 mg | ORAL_TABLET | Freq: Every day | ORAL | Status: DC
  Administered 2021-05-10 – 2021-05-13 (×4): 10 mg via ORAL
  Filled 2021-05-10 (×4): qty 1

## 2021-05-10 NOTE — ED Notes (Signed)
Pt's wife refused to sign MSE, stating that she had never heard of signing and did not understand why she needed to sign anything. Pt's wife also stated that she thought pt would be find to come off his bi-pap and breathe on his own. This RN informed the pt's wife that his asthma was very bad and that he needed the bi-pap to keep his oxygen levels up. I also informed the MD, who went into the room to speak with the pt and his wife.

## 2021-05-10 NOTE — ED Provider Notes (Signed)
[facility] DEPT Provider Note   CSN: [id number] Arrival date & time: 05/09/21  2326     History Chief Complaint  Patient presents with   Shortness of Breath    Mason [name] is a 75 y.o. male.  Patient is a 75 year old male with a history of asthma, pancreatitis, anemia, prior GI bleeding who is presenting by EMS today due to respiratory distress.  EMS was giving the history because patient is extremely short of breath.  They report that they were out at his house a few days ago and at that time he was wheezing but after a nebulized treatment his wheezing improved and he felt better.  They were called out again tonight due to the shortness of breath.  Patient does indicate that he has been coughing but denies having fever or productive cough.  He was trying his ProAir inhaler at home without help.  When EMS arrived his sats were 80% on room air.  He denies any pain in his chest or swelling in his legs.  He is unable to give further history but EMS reports that he received albuterol, Atrovent, Solu-Medrol, magnesium.  The history is provided by the patient and the EMS personnel. The history is limited by the condition of the patient.  Shortness of Breath Severity:  Severe     Past Medical History:  Diagnosis Date   GERD (gastroesophageal reflux disease)    Gout attack 1990   Mass of colon    Necrotizing pancreatitis 03/26/2014   Pancreatitis, acute 07/08/2013   Pancreatitis, gallstone    Seasonal allergies    Seasonal asthma    "related to allergies"    Patient Active Problem List   Diagnosis Date Noted   Elevated PSA 09/30/2020   Gastroesophageal reflux disease 09/30/2020   Hyperlipidemia 09/30/2020   Mild persistent asthma 09/30/2020   Knee pain 09/30/2020   Amnesia 09/30/2020   ED (erectile dysfunction) of organic origin 09/30/2020   Mass of hepatic flexure s/p robotic proximal right colectomy 09/29/2020 09/29/2020   High grade dysplasia in  colonic adenoma 07/27/2020   Chronic thrombosis of splenic vein after pancreatitis 2015 07/28/2014   Cyst of pancreas 06/22/2014   Acute pancreatitis 04/28/2014   GI bleed 04/14/2014   Anemia 04/14/2014   Sepsis ([place]) 04/08/2014   Abnormal abdominal CT scan 04/07/2014   Right otitis media 04/02/2014   Fever 04/01/2014   History of alcohol use, denies any recent alcohol use 03/26/2014   SOB (shortness of breath) 07/17/2013   Intermittent fever 07/17/2013   Seasonal asthma 07/17/2013   Fever, unspecified 07/11/2013   Constipation 07/11/2013   Cardiomegaly 07/10/2013   Abnormal ultrasound of abdomen 07/09/2013   Abdominal pain, acute, periumbilical 35/46/5681   Nausea & vomiting 07/08/2013   Overweight (BMI 25.0-29.9) 07/08/2013    Past Surgical History:  Procedure Laterality Date   CHOLECYSTECTOMY N/A 03/29/2014   Procedure: LAPAROSCOPIC CHOLECYSTECTOMY WITH INTRAOPERATIVE CHOLANGIOGRAM;  Surgeon: Coralie Keens, MD;  Location: [facility];  Service: General;  Laterality: N/A;   CIRCUMCISION  ~ [place] Right 2014   "for tear"   PANCREAS SURGERY         History reviewed. No pertinent family history.  Social History   Tobacco Use   Smoking status: Never   Smokeless tobacco: Never  Vaping Use   Vaping Use: Never used  Substance Use Topics   Alcohol use: Yes    Comment: 2  beer a month   Drug use: No    Home Medications Prior to Admission medications   Medication Sig Start Date End Date Taking? Authorizing Provider  albuterol (PROVENTIL) (2.5 MG/3ML) 0.083% nebulizer solution Take 3 mLs (2.5 mg total) by nebulization every 4 (four) hours as needed for wheezing or shortness of breath. 04/07/21   Valentina Shaggy, MD  albuterol (VENTOLIN HFA) 108 (90 Base) MCG/ACT inhaler Inhale 2 puffs into the lungs every 4 (four) hours as needed for wheezing or shortness of breath. 04/07/21   Valentina Shaggy, MD  aspirin 325 MG EC tablet Take 325 mg by mouth daily as needed for pain.    [provider]  Aspirin-Caffeine (BAYER BACK & BODY PAIN EX ST) 500-32.5 MG TABS Take 2 tablets by mouth daily as needed (pain).    [provider]  EPINEPHrine 0.3 mg/0.3 mL IJ SOAJ injection Inject 0.3 mg into the muscle as needed for anaphylaxis. Patient not taking: Reported on 05/04/2021 04/07/21   Valentina Shaggy, MD  Fluticasone-Umeclidin-Vilant (TRELEGY ELLIPTA) 200-62.5-25 MCG/ACT AEPB Inhale 1 puff into the lungs daily. 04/07/21   Valentina Shaggy, MD  levocetirizine (XYZAL) 5 MG tablet Take 1 tablet (5 mg total) by mouth 2 (two) times daily as needed for allergies (Can use an extran dose during flares). Patient not taking: Reported on 05/04/2021 04/07/21   Valentina Shaggy, MD    Allergies    Patient has no known allergies.  Review of Systems   Review of Systems  Unable to perform ROS: Severe respiratory distress  Respiratory:  Positive for shortness of breath.    Physical Exam Updated Vital Signs BP (!) 128/103    Pulse (!) 114    Temp 97.9 F (36.6 C) (Oral)    Resp (!) 28    Ht 5' 10.15" (1.782 m)    Wt 85 kg    SpO2 96%    BMI 26.77 kg/m   Physical Exam Vitals and nursing note reviewed.  Constitutional:      General: He is in acute distress.     Appearance: He is well-developed. He is diaphoretic.  HENT:     Head: Normocephalic and atraumatic.     Right Ear: External ear normal.     Left Ear: External ear normal.  Eyes:     General:        Right eye: No discharge.     Conjunctiva/sclera: Conjunctivae normal.     Pupils: Pupils are equal, round, and reactive to light.  Cardiovascular:     Rate and Rhythm: Regular rhythm. Tachycardia present.     Heart sounds: Normal heart sounds. No murmur heard. Pulmonary:     Effort: Tachypnea, accessory muscle usage and respiratory distress present.     Breath sounds: Wheezing present. No decreased breath  sounds, rhonchi or rales.  Abdominal:     General: There is no distension.     Palpations: Abdomen is soft.     Tenderness: There is no abdominal tenderness. There is no guarding or rebound.  Musculoskeletal:        General: No tenderness. Normal range of motion.     Cervical back: Normal range of motion and neck supple.     Right lower leg: No tenderness. No edema.     Left lower leg: No tenderness. No edema.  Skin:    General: Skin is warm.     Findings: No erythema or rash.  Neurological:     Mental  Status: He is alert and oriented to person, place, and time. Mental status is at baseline.  Psychiatric:        Mood and Affect: Mood normal.        Behavior: Behavior normal.    ED Results / Procedures / Treatments   Labs (all labs ordered are listed, but only abnormal results are displayed) Labs Reviewed  BLOOD GAS, VENOUS - Abnormal; Notable for the following components:      Result Value   pH, Ven 7.221 (*)    pCO2, Ven 63.3 (*)    pO2, Ven 77.4 (*)    Acid-base deficit 4.3 (*)    All other components within normal limits  RESP PANEL BY RT-PCR (FLU A&B, COVID) ARPGX2  CBC WITH DIFFERENTIAL/PLATELET  COMPREHENSIVE METABOLIC PANEL    EKG None  Radiology No results found.  Procedures Procedures   Medications Ordered in ED Medications  LORazepam (ATIVAN) injection 1 mg (1 mg Intravenous Given 05/09/21 2344)  albuterol (PROVENTIL) (2.5 MG/3ML) 0.083% nebulizer solution 15 mg (15 mg Nebulization Given by Other 05/09/21 2350)    ED Course  I have reviewed the triage vital signs and the nursing notes.  Pertinent labs & imaging results that were available during my care of the patient were reviewed by me and considered in my medical decision making (see chart for details).    MDM Rules/Calculators/A&P                         Patient is a 75 year old male presenting today in respiratory distress.  He received albuterol, Atrovent, Solu-Medrol, magnesium prior to  arrival with EMS.  Upon arrival here patient is in distress.  He is tachypneic with diffuse wheezing, accessory muscle use, diaphoresis and anxiety.  Sats are in the low 90s on a continuous neb at 15 L.  He continues to report he is short of breath.  He denies any chest pain.  Concern for asthma exacerbation versus infectious etiology and pneumonia.  Lower suspicion for CHF or ACS.  Patient was transitioned to BiPAP and given Ativan.  Also started on a continuous albuterol nebulizer.  VBG with respiratory acidosis with a CO2 of 63 and a pH of 7.22.  We will continue BiPAP at this time.  Labs and imaging are pending.  1:37 AM Labs and imaging are reassuring.  EKG without acute findings. Patient's repeat blood gas is improving.  Still having wheezing and some retractions and will do a second continuous neb and some Atrovent.  Patient will be admitted for further care.  We will continue BiPAP at this time.  MDM   Amount and/or Complexity of Data Reviewed Clinical lab tests: ordered and reviewed Tests in the radiology section of CPT: ordered and reviewed Tests in the medicine section of CPT: ordered and reviewed Decide to obtain previous medical records or to obtain history from someone other than the patient: yes Obtain history from someone other than the patient: yes Review and summarize past medical records: yes Discuss the patient with other providers: yes Independent visualization of images, tracings, or specimens: yes   CRITICAL CARE Performed by: Tobias Avitabile Total critical care time: 50 minutes Critical care time was exclusive of separately billable procedures and treating other patients. Critical care was necessary to treat or prevent imminent or life-threatening deterioration. Critical care was time spent personally by me on the following activities: development of treatment plan with patient and/or surrogate as well as nursing, discussions with  consultants, evaluation of patient's  response to treatment, examination of patient, obtaining history from patient or surrogate, ordering and performing treatments and interventions, ordering and review of laboratory studies, ordering and review of radiographic studies, pulse oximetry and re-evaluation of patient's condition.     Final Clinical Impression(s) / ED Diagnoses Final diagnoses:  Severe persistent asthma with exacerbation  Acute respiratory failure with hypoxia and hypercapnia [facility])    Rx / DC Orders ED Discharge Orders     None        Blanchie Dessert, MD 05/10/21 0139

## 2021-05-10 NOTE — Plan of Care (Signed)
Discussed with patient plan of care for the evening, pain management, primo fit and admission questions with some teach back displayed.  Wife came up from emergency department.  Problem: Education: Goal: Knowledge of General Education information will improve Description: Including pain rating scale, medication(s)/side effects and non-pharmacologic comfort measures Outcome: Progressing   Problem: Elimination: Goal: Will not experience complications related to bowel motility Outcome: Progressing   Problem: Pain Managment: Goal: General experience of comfort will improve Outcome: Progressing

## 2021-05-10 NOTE — H&P (Signed)
History and Physical    PLEASE NOTE THAT DRAGON DICTATION SOFTWARE WAS USED IN THE CONSTRUCTION OF THIS NOTE.   Mason [name] [id number]:[id number] DOB: 1946/04/16 DOA: 05/09/2021  PCP: London Pepper, MD  Patient coming from: home   I have personally briefly reviewed patient's old medical records in [place]  Chief Complaint: Shortness of breath  HPI: Mason [name] is a 75 y.o. male with medical history significant for moderate persistent asthma, allergic rhinitis, who is admitted to [facility] on 05/09/2021 with acute hypoxic hypercapnic respiratory failure in the setting of acute asthma exacerbation after presenting from home to [facility] ED complaining of shortness of breath.   Patient has been experiencing 2 to 3 days of progressive shortness of breath associated with wheezing, in the absence of sore throat.  He denies any associated chest pain, shortness of breath, diaphoresis, nausea, vomiting.  Also denies any recent orthopnea, PND, or worsening of peripheral edema no hemoptysis, new calf tenderness, new lower extremity erythema.  No recent trauma or travel.  He notes good compliance with home Trelegy Ellipta, and conveys increasing frequency of use of prn albuterol inhaler over the last few days, without any significant improvement in his shortness of breath with this measure.  He acknowledges a history of allergic rhinitis, conveys that his asthma appears to flare with worsening allergies.  He conveys that he is previously on levocetirizine, but no longer takes this medication.  Confirms no known baseline supplemental oxygen requirements.  Lifelong non-smoker.  No recent subjective fever, chills, rigors, or generalized myalgias.  In the setting of further progression of his shortness of breath, EMS was contacted noted initial O2 sats to be in the low 80s on room air, with EMS consistently bringing the patient to [facility] emergency department for further evaluation management thereof,  with administration of the following in route to the hospital: IV magnesium, Solu-Medrol, albuterol nebulizer, Atrovent inhaler.     ED Course:  Vital signs in the ED were notable for the following: Temperature max 97.9; initial heart rate 114, sodium improving to 103; blood pressure 106/85 -123/93; respiratory rate 20-30,Initial oxygen saturation in the ED noted to be 83 to 84% on room air, prompting initiation of with settings of 14/6 and 50% FiO2, with ensuing improvement in O2 sats into the range of 97 to 99% on room air.    Labs were notable for the following: CMP notable for the following: Sodium 139, bicarbonate 23, creatinine 1.31 compared to baseline creatinine range 1.0-1.2, glucose 139, liver enzymes within normal limits.  CBC notable for white blood cell count 8700.  COVID-19/influenza PCR called to be negative.  Imaging and additional notable ED work-up: EKG was performed, however trepidation there was limited by the presence of artifact, confines, EKG appears to demonstrate sinus tachycardia with heart rate 121, nonspecific T wave inversion in aVL, and no evidence of ST changes, including no evidence of ST elevation.  Chest x-ray showed no evidence of acute cardiopulmonary process, including no evidence of infiltrate, edema, effusion, or pneumothorax.  While in the ED, the following were administered: Albuterol nebulizer, treatment nebulizer, Ativan 1 mg IV x1, continuous LR at 125 cc/h.  Socially, the patient was admitted to the stepdown unit for further evaluation management of presenting acute hypoxic hypercapnic respiratory failure in the setting of acute asthma exacerbation.     Review of Systems: As per HPI otherwise 10 point review of systems negative.   Past Medical History:  Diagnosis Date   GERD (  gastroesophageal reflux disease)    Gout attack 1990   Mass of colon    Necrotizing pancreatitis 03/26/2014   Pancreatitis, acute 07/08/2013   Pancreatitis, gallstone     Seasonal allergies    Seasonal asthma    "related to allergies"    Past Surgical History:  Procedure Laterality Date   CHOLECYSTECTOMY N/A 03/29/2014   Procedure: LAPAROSCOPIC CHOLECYSTECTOMY WITH INTRAOPERATIVE CHOLANGIOGRAM;  Surgeon: Coralie Keens, MD;  Location: [place];  Service: General;  Laterality: N/A;   CIRCUMCISION  ~ Merlin Right 2014   "for tear"   PANCREAS SURGERY      Social History:  reports that he has never smoked. He has never used smokeless tobacco. He reports current alcohol use. He reports that he does not use drugs.   No Known Allergies  History reviewed. No pertinent family history.    Prior to Admission medications   Medication Sig Start Date End Date Taking? Authorizing Provider  albuterol (PROVENTIL) (2.5 MG/3ML) 0.083% nebulizer solution Take 3 mLs (2.5 mg total) by nebulization every 4 (four) hours as needed for wheezing or shortness of breath. 04/07/21   Valentina Shaggy, MD  albuterol (VENTOLIN HFA) 108 (90 Base) MCG/ACT inhaler Inhale 2 puffs into the lungs every 4 (four) hours as needed for wheezing or shortness of breath. 04/07/21   Valentina Shaggy, MD  aspirin 325 MG EC tablet Take 325 mg by mouth daily as needed for pain.    [provider]  Aspirin-Caffeine (BAYER BACK & BODY PAIN EX ST) 500-32.5 MG TABS Take 2 tablets by mouth daily as needed (pain).    [provider]  EPINEPHrine 0.3 mg/0.3 mL IJ SOAJ injection Inject 0.3 mg into the muscle as needed for anaphylaxis. Patient not taking: Reported on 05/04/2021 04/07/21   Valentina Shaggy, MD  Fluticasone-Umeclidin-Vilant (TRELEGY ELLIPTA) 200-62.5-25 MCG/ACT AEPB Inhale 1 puff into the lungs daily. 04/07/21   Valentina Shaggy, MD  levocetirizine (XYZAL) 5 MG tablet Take 1 tablet (5 mg total) by mouth 2 (two) times daily as needed for allergies (Can use an extran dose during  flares). Patient not taking: Reported on 05/04/2021 04/07/21   Valentina Shaggy, MD     Objective    Physical Exam: Vitals:   05/09/21 2347 05/09/21 2352 05/10/21 0030 05/10/21 0100  BP:   (!) 123/93 106/85  Pulse:  (!) 114 (!) 115 (!) 112  Resp:  (!) 28 (!) 30 (!) 22  Temp:      TempSrc:      SpO2:  96% 97% 97%  Weight: 85 kg     Height: 5' 10.15" (1.782 m)       General: appears to be stated age; alert; increased work of breathing noted Skin: warm, dry, no rash Head:  AT/[place] Mouth:  Oral mucosa membranes appear moist, normal dentition Neck: supple; trachea midline Heart:  RRR; did not appreciate any M/R/G Lungs: Bilateral expiratory wheezes noted, but otherwise CTAB, did not appreciate any , rales, or rhonchi Abdomen: + BS; soft, ND, NT Vascular: 2+ pedal pulses b/l; 2+ radial pulses b/l Extremities: no peripheral edema, no muscle wasting Neuro: strength and sensation intact in upper and lower extremities b/l    Labs on Admission: I have personally reviewed following labs and imaging studies  CBC: Recent Labs  Lab 05/09/21 2352  WBC 8.7  NEUTROABS 6.2  HGB 17.6*  HCT 51.6  MCV 100.6*  PLT 841   Basic Metabolic Panel: Recent Labs  Lab 05/09/21 2352  NA 139  K 4.1  CL 109  CO2 23  GLUCOSE 136*  BUN 18  CREATININE 1.31*  CALCIUM 9.0   GFR: Estimated Creatinine Clearance: 51.3 mL/min (A) (by C-G formula based on SCr of 1.31 mg/dL (H)). Liver Function Tests: Recent Labs  Lab 05/09/21 2352  AST 42*  ALT 34  ALKPHOS 77  BILITOT 1.0  PROT 8.0  ALBUMIN 4.3   No results for input(s): LIPASE, AMYLASE in the last 168 hours. No results for input(s): AMMONIA in the last 168 hours. Coagulation Profile: No results for input(s): INR, PROTIME in the last 168 hours. Cardiac Enzymes: No results for input(s): CKTOTAL, CKMB, CKMBINDEX, TROPONINI in the last 168 hours. BNP (last 3 results) No results for input(s): PROBNP in the last 8760  hours. HbA1C: No results for input(s): HGBA1C in the last 72 hours. CBG: No results for input(s): GLUCAP in the last 168 hours. Lipid Profile: No results for input(s): CHOL, HDL, LDLCALC, TRIG, CHOLHDL, LDLDIRECT in the last 72 hours. Thyroid Function Tests: No results for input(s): TSH, T4TOTAL, FREET4, T3FREE, THYROIDAB in the last 72 hours. Anemia Panel: No results for input(s): VITAMINB12, FOLATE, FERRITIN, TIBC, IRON, RETICCTPCT in the last 72 hours. Urine analysis:    Component Value Date/Time   COLORURINE AMBER (A) 04/09/2014 0237   APPEARANCEUR CLOUDY (A) 04/09/2014 0237   LABSPEC 1.025 04/09/2014 0237   PHURINE 5.0 04/09/2014 0237   GLUCOSEU NEGATIVE 04/09/2014 0237   HGBUR MODERATE (A) 04/09/2014 0237   BILIRUBINUR SMALL (A) 04/09/2014 0237   KETONESUR 40 (A) 04/09/2014 0237   PROTEINUR NEGATIVE 04/09/2014 0237   UROBILINOGEN 1.0 04/09/2014 0237   NITRITE NEGATIVE 04/09/2014 0237   LEUKOCYTESUR NEGATIVE 04/09/2014 0237    Radiological Exams on Admission: DG Chest Port 1 View  Result Date: 05/10/2021 CLINICAL DATA:  Shortness of breath. EXAM: PORTABLE CHEST 1 VIEW COMPARISON:  November 26, 2017 FINDINGS: The heart size and mediastinal contours are within normal limits. Both lungs are clear. The visualized skeletal structures are unremarkable. IMPRESSION: No active disease. Electronically Signed   By: Virgina Norfolk M.D.   On: 05/10/2021 00:07     EKG: Independently reviewed, with result as described above.    Assessment/Plan    Principal Problem:   Acute asthma exacerbation Active Problems:   SOB (shortness of breath)   Acute respiratory failure with hypoxia and hypercapnia (HCC)   Allergic rhinitis    #) Acute asthma exacerbation: In the setting of a documented history of moderate persistent asthma presentation appears consistent with acute exacerbation setting of 2 3 days of progressive shortness of breath associated with wheezing, increased work of  breathing, new supplemental oxygen requirement, as further quantified below.  Appears to be an element of extrinsic factors, with patient reporting recurrent asthma flares associated with allergic rhinitis, while noting recent worsening of his allergic rhinitis, having previously stopped his home H2 blocker.  He reports compliance with home Trelegy Ellipta, and confirms he is a lifelong non-smoker.  No additional overt contributing factors leading to his acute asthma exacerbation, including presenting chest x-ray did not demonstrate evidence of acute cardiopulmonary process, including no clinical or radiographic evidence to suggest pneumonia.  COVID-19/influenza PCR negative.  Breathing much more comfortably, with interval improvement oxygen saturations as well as improving evidence of hypercapnia following initiation of BiPAP too. of note, it appears that the patient was at least previously prescribed Omalizumab, although it is  unclear to me at this time if he is currently taking this medication.   Plan: Scheduled duo nebulizers, prn albuterol nebulizer.  Solumedrol 80 mg IV twice daily.  Add on serum magnesium level.  Check serum Phos level.  Repeat VBG with a.m. labs.  Continue BiPAP in the interval.  Monitor continuous pulse oximetry.  Start loratadine.  Add on procalcitonin.  will further assess the presence of Omalizumab as it relates to his outpatient management.          #) Acute hypoxic hypercapnic respiratory failure: in the context of acute respiratory symptoms and no known baseline supplemental O2 requirements, presenting O2 sat in the low 80s on room air with VBG reflecting uncompensated respiratory acidosis, as quantified above, with ensuing improvement in O2 sats into the high 90s improving respiratory acidosis per updated BMP following interval BiPAP on settings of 14/6 with 50% FiO2. Appears to be on basis of acute asthma exacerbation, as further detailed above. Presenting CXR shows no  evidence of acute cardiopulmonary process, including no evidence of infiltrate, edema, effusion, or pneumothorax..   In terms of other considered etiologies, ACS appears less likely at this time in the absence of any recent CP and in the context of presenting EKG showing no e/o acute ischemic process. No clinical or radiographic evidence to suggest acutely decompensated heart failure at this time. Clinically, presentation is less suggestive of acute PE at this time. COVID-19/Influenza PCR were negative when checked today.   Plan: further evaluation/management of presenting acute asthma exacerbation, as above.  Continue BiPAP for now, with repeat ABG ordered for the morning.  Monitor continuous pulse ox with prn supplemental O2 to maintain O2 sats greater than or equal to 92%. monitor on telemetry. CMP/CBC in the AM. Check serum Mg and Phos levels.  Procalcitonin.  DuoNebs, as needed albuterol nebs, solumedrol, as above.       #) Allergic rhinitis: Documented history of such, with patient noting decrease in both environmental and seasonal elements, suggestive of extrinsic contributions to the patient's underlying asthma.  Was previously on Xyzal, but no longer taking any H2 blocker at this time.  Presents with acute asthma exacerbation preceded by worsening allergic rhinitis.   Start loratadine daily.  Consider initiation of intranasal Flonase as first-line intervention for allergic rhinitis.  Further evaluation and management of presenting acute asthma exacerbation, as above.     DVT prophylaxis: SCD's   Code Status: Full code Disposition Plan: Per Rounding Team Consults called: none;  Admission status: Stepdown unit; inpatient  Warrants inpatient status on basis of need for further evaluation and management of acute asthma exacerbation currently requiring noninvasive mechanical ventilation as result of new supplemental oxygen requirement as well as uncompensated respiratory acidosis, with  close monitoring in setting of blood gases to guide additional titration of settings related to noninvasive mechanical ventilation.   PLEASE NOTE THAT DRAGON DICTATION SOFTWARE WAS USED IN THE CONSTRUCTION OF THIS NOTE.   [place] DO [facility] [place]   05/10/2021, 1:42 AM

## 2021-05-10 NOTE — ED Notes (Signed)
Attempted to call and update pt's wife about his bed placement; mailbox is full and unable to leave her a message

## 2021-05-10 NOTE — Progress Notes (Signed)
Pt transported to ICU on BIPAP without complication.  

## 2021-05-10 NOTE — Progress Notes (Signed)
PROGRESS NOTE    Mason [name]  YWV:[id number] DOB: [birth year] DOA: 05/09/2021 PCP: London Pepper, MD    Brief Narrative:  75 y.o. male with medical history significant for moderate persistent asthma, allergic rhinitis, who is admitted to [facility] on 05/09/2021 with acute hypoxic hypercapnic respiratory failure in the setting of acute asthma exacerbation after presenting from home to [facility] ED complaining of shortness of breath.   Pt presented with asthma exacerbation with hypercarbia, requiring bipap  Assessment & Plan:   Principal Problem:   Acute asthma exacerbation Active Problems:   SOB (shortness of breath)   Acute respiratory failure with hypoxia and hypercapnia (HCC)   Allergic rhinitis    #) Acute asthma exacerbation:  -covid and flu neg -had required bipap overnight, now weaned to [facility] this AM -Pt is O2 naive. -Cont bronchodilators and solumedrol -Wean O2 as tolerated  #) Acute hypoxic hypercapnic respiratory failure:  -Per above, cont neb tx and steroids as tolerated -Wean O2 as tolerated. Currently down to [facility] this afternoon    #) Allergic rhinitis: Cont loratadine daily.  -Seems stable  #) Anxiety -Staff reports increased agitation and anxiety this afternoon -May be related to above high dosed solumedrol at 80mg  bid. Have decreased to 40mg  bid  #) ARF -Presenting Cr of 1.31, previous Cr of 1.04 on 09/30/20 -On LR at 100cc/hr, will decrease to 75cc/hr -Repeat bmet in AM  #) Hyperglycemia without diabetes -No hx of diabetes. Recent a1c of 4.8 on 09/21/20 -Suspect hyperglycemia secondary to high dosed IV steroids    DVT prophylaxis: SCD's Code Status: Full Family Communication: Pt in room, family currently at bedside  Status is: Inpatient  Remains inpatient appropriate because: Severity of illness, requiring supplemental O2 in pt who is O2 naive   Consultants:    Procedures:    Antimicrobials: Anti-infectives (From admission, onward)     None       Subjective: States breathing better, still coughing and wheezing  Objective: Vitals:   05/10/21 1400 05/10/21 1410 05/10/21 1500 05/10/21 1615  BP: 134/72   126/64  Pulse: 95  (!) 106 (!) 105  Resp: (!) 23  (!) 25 (!) 24  Temp:      TempSrc:      SpO2: 93% 96% 95% 94%  Weight:      Height:        Intake/Output Summary (Last 24 hours) at 05/10/2021 1751 Last data filed at 05/10/2021 1400 Gross per 24 hour  Intake 1136.43 ml  Output --  Net 1136.43 ml   Filed Weights   05/09/21 2347 05/10/21 0329  Weight: 85 kg 77.2 kg    Examination: General exam: Awake, laying in bed, in nad Respiratory system: increased resp effort, wheezing throughout Cardiovascular system: regular rate, s1, s2 Gastrointestinal system: Soft, nondistended, positive BS Central nervous system: CN2-12 grossly intact, strength intact Extremities: Perfused, no clubbing Skin: Normal skin turgor, no notable skin lesions seen Psychiatry: Mood normal // no visual hallucinations   Data Reviewed: I have personally reviewed following labs and imaging studies  CBC: Recent Labs  Lab 05/09/21 2352 05/10/21 0313  WBC 8.7 10.4  NEUTROABS 6.2 9.4*  HGB 17.6* 16.6  HCT 51.6 49.2  MCV 100.6* 99.8  PLT 267 854   Basic Metabolic Panel: Recent Labs  Lab 05/09/21 2352 05/10/21 0313  NA 139 140  K 4.1 4.2  CL 109 109  CO2 23 21*  GLUCOSE 136* 162*  BUN 18 18  CREATININE 1.31* 1.37*  CALCIUM  9.0 9.0  MG 3.2* 2.3  PHOS  --  3.4   GFR: Estimated Creatinine Clearance: 49 mL/min (A) (by C-G formula based on SCr of 1.37 mg/dL (H)). Liver Function Tests: Recent Labs  Lab 05/09/21 2352 05/10/21 0313  AST 42* 43*  ALT 34 33  ALKPHOS 77 69  BILITOT 1.0 0.7  PROT 8.0 7.3  ALBUMIN 4.3 3.9   No results for input(s): LIPASE, AMYLASE in the last 168 hours. No results for input(s): AMMONIA in the last 168 hours. Coagulation Profile: No results for input(s): INR, PROTIME in the last  168 hours. Cardiac Enzymes: No results for input(s): CKTOTAL, CKMB, CKMBINDEX, TROPONINI in the last 168 hours. BNP (last 3 results) No results for input(s): PROBNP in the last 8760 hours. HbA1C: No results for input(s): HGBA1C in the last 72 hours. CBG: No results for input(s): GLUCAP in the last 168 hours. Lipid Profile: No results for input(s): CHOL, HDL, LDLCALC, TRIG, CHOLHDL, LDLDIRECT in the last 72 hours. Thyroid Function Tests: No results for input(s): TSH, T4TOTAL, FREET4, T3FREE, THYROIDAB in the last 72 hours. Anemia Panel: No results for input(s): VITAMINB12, FOLATE, FERRITIN, TIBC, IRON, RETICCTPCT in the last 72 hours. Sepsis Labs: Recent Labs  Lab 05/10/21 0735  PROCALCITON <0.10    Recent Results (from the past 240 hour(s))  Resp Panel by RT-PCR (Flu A&B, Covid) Nasopharyngeal Swab     Status: None   Collection Time: 05/09/21 11:39 PM   Specimen: Nasopharyngeal Swab; Nasopharyngeal(NP) swabs in vial transport medium  Result Value Ref Range Status   SARS Coronavirus 2 by RT PCR NEGATIVE NEGATIVE Final    Comment: (NOTE) SARS-CoV-2 target nucleic acids are NOT DETECTED.  The SARS-CoV-2 RNA is generally detectable in upper respiratory specimens during the acute phase of infection. The lowest concentration of SARS-CoV-2 viral copies this assay can detect is 138 copies/mL. A negative result does not preclude SARS-Cov-2 infection and should not be used as the sole basis for treatment or other patient management decisions. A negative result may occur with  improper specimen collection/handling, submission of specimen other than nasopharyngeal swab, presence of viral mutation(s) within the areas targeted by this assay, and inadequate number of viral copies(<138 copies/mL). A negative result must be combined with clinical observations, patient history, and epidemiological information. The expected result is Negative.  Fact Sheet for Patients:   EntrepreneurPulse.com.au  Fact Sheet for Healthcare Providers:  IncredibleEmployment.be  This test is no t yet approved or cleared by the [place] FDA and  has been authorized for detection and/or diagnosis of SARS-CoV-2 by FDA under an Emergency Use Authorization (EUA). This EUA will remain  in effect (meaning this test can be used) for the duration of the COVID-19 declaration under Section 564(b)(1) of the Act, 21 U.S.C.section 360bbb-3(b)(1), unless the authorization is terminated  or revoked sooner.       Influenza A by PCR NEGATIVE NEGATIVE Final   Influenza B by PCR NEGATIVE NEGATIVE Final    Comment: (NOTE) The Xpert Xpress SARS-CoV-2/FLU/RSV plus assay is intended as an aid in the diagnosis of influenza from Nasopharyngeal swab specimens and should not be used as a sole basis for treatment. Nasal washings and aspirates are unacceptable for Xpert Xpress SARS-CoV-2/FLU/RSV testing.  Fact Sheet for Patients: EntrepreneurPulse.com.au  Fact Sheet for Healthcare Providers: IncredibleEmployment.be  This test is not yet approved or cleared by the [place] FDA and has been authorized for detection and/or diagnosis of SARS-CoV-2 by FDA under an Emergency Use Authorization (EUA).  This EUA will remain in effect (meaning this test can be used) for the duration of the COVID-19 declaration under Section 564(b)(1) of the Act, 21 U.S.C. section 360bbb-3(b)(1), unless the authorization is terminated or revoked.  Performed at [facility], [place] [address], [place], [place] [address]   MRSA Next Gen by PCR, Nasal     Status: None   Collection Time: 05/10/21  3:27 AM   Specimen: Nasal Mucosa; Nasal Swab  Result Value Ref Range Status   MRSA by PCR Next Gen NOT DETECTED NOT DETECTED Final    Comment: (NOTE) The GeneXpert MRSA Assay (FDA approved for NASAL specimens only), is one  component of a comprehensive MRSA colonization surveillance program. It is not intended to diagnose MRSA infection nor to guide or monitor treatment for MRSA infections. Test performance is not FDA approved in patients less than 64 years old. Performed at [facility], [place] [address], [place], [place] [address]      Radiology Studies: [facility] Chest Port 1 View  Result Date: 05/10/2021 CLINICAL DATA:  Shortness of breath. EXAM: PORTABLE CHEST 1 VIEW COMPARISON:  November 26, 2017 FINDINGS: The heart size and mediastinal contours are within normal limits. Both lungs are clear. The visualized skeletal structures are unremarkable. IMPRESSION: No active disease. Electronically Signed   By: Virgina Norfolk M.D.   On: 05/10/2021 00:07    Scheduled Meds:  chlorhexidine  15 mL Mouth Rinse BID   Chlorhexidine Gluconate Cloth  6 each Topical Daily   ipratropium-albuterol  3 mL Nebulization Q6H   loratadine  10 mg Oral Daily   mouth rinse  15 mL Mouth Rinse q12n4p   methylPREDNISolone (SOLU-MEDROL) injection  80 mg Intravenous Q12H   Continuous Infusions:  lactated ringers Stopped (05/10/21 1318)     LOS: 0 days   Marylu Lund, MD [facility] Pager On Amion  If 7PM-7AM, please contact night-coverage 05/10/2021, 5:51 PM

## 2021-05-11 DIAGNOSIS — J9601 Acute respiratory failure with hypoxia: Secondary | ICD-10-CM | POA: Diagnosis not present

## 2021-05-11 DIAGNOSIS — N179 Acute kidney failure, unspecified: Secondary | ICD-10-CM | POA: Diagnosis not present

## 2021-05-11 DIAGNOSIS — R739 Hyperglycemia, unspecified: Secondary | ICD-10-CM

## 2021-05-11 DIAGNOSIS — J4541 Moderate persistent asthma with (acute) exacerbation: Secondary | ICD-10-CM | POA: Diagnosis not present

## 2021-05-11 DIAGNOSIS — R000 Tachycardia, unspecified: Secondary | ICD-10-CM

## 2021-05-11 LAB — CBC
HCT: 45.1 % (ref 39.0–52.0)
Hemoglobin: 14.9 g/dL (ref 13.0–17.0)
MCH: 33.6 pg (ref 26.0–34.0)
MCHC: 33 g/dL (ref 30.0–36.0)
MCV: 101.8 fL — ABNORMAL HIGH (ref 80.0–100.0)
Platelets: 187 10*3/uL (ref 150–400)
RBC: 4.43 MIL/uL (ref 4.22–5.81)
RDW: 12.8 % (ref 11.5–15.5)
WBC: 11.2 10*3/uL — ABNORMAL HIGH (ref 4.0–10.5)
nRBC: 0 % (ref 0.0–0.2)

## 2021-05-11 LAB — COMPREHENSIVE METABOLIC PANEL
ALT: 32 U/L (ref 0–44)
AST: 46 U/L — ABNORMAL HIGH (ref 15–41)
Albumin: 3.7 g/dL (ref 3.5–5.0)
Alkaline Phosphatase: 60 U/L (ref 38–126)
Anion gap: 7 (ref 5–15)
BUN: 20 mg/dL (ref 8–23)
CO2: 24 mmol/L (ref 22–32)
Calcium: 8.9 mg/dL (ref 8.9–10.3)
Chloride: 107 mmol/L (ref 98–111)
Creatinine, Ser: 1.16 mg/dL (ref 0.61–1.24)
GFR, Estimated: >60 mL/min (ref >60)
Glucose, Bld: 130 mg/dL — ABNORMAL HIGH (ref 70–99)
Potassium: 4.6 mmol/L (ref 3.5–5.1)
Sodium: 138 mmol/L (ref 135–145)
Total Bilirubin: 1.1 mg/dL (ref 0.3–1.2)
Total Protein: 6.7 g/dL (ref 6.5–8.1)

## 2021-05-11 MED ORDER — PHENOL 1.4 % MT LIQD: 1.0000 | OROMUCOSAL | Status: DC | PRN

## 2021-05-11 MED ORDER — METHYLPREDNISOLONE SODIUM SUCC 40 MG IJ SOLR
40.0000 mg | Freq: Every day | INTRAMUSCULAR | Status: DC
  Administered 2021-05-12: 10:00:00 40 mg via INTRAVENOUS
  Filled 2021-05-11 (×2): qty 1

## 2021-05-11 NOTE — Progress Notes (Signed)
Sitter at bedside reports patient has been coughing shortly after eating lunch. The pt appears SOB, O2 saturation 88-89 % on 3lt [place]. Pt stated he felt he was experiencing an asthma attack. Nebulizer treatment administered. Expiratory wheezes all lobes. Provider paged with update. Will continue to monitor.

## 2021-05-11 NOTE — Progress Notes (Signed)
PROGRESS NOTE    Mason [name]  WEX:[id number] DOB: 31-Jul-1945 DOA: 05/09/2021 PCP: London Pepper, MD   Brief Narrative: Mason [name] is a 75 y.o. male with a history of moderate persistent asthma and allergic rhinitis. Patient presented secondary to shortness of breath and was found to have evidence of an asthma exacerbation with associated respiratory failure. Patient required BiPAP initially and has been weaned to oxygen via [place].   Assessment & Plan:   * Acute respiratory failure with hypoxia and hypercapnia (HCC) Secondary to acute asthma exacerbation. On admission, on venous blood gas, patient with a pCO2 of 63.3 with associated pH of 7.221; no ABG obtained. Initially required BiPAP and is now weaned to oxygen via [place]. -Wean to room air as able -Ambulatory pulse oximetry -PT eval  Acute asthma exacerbation Possibly related to viral etiology. Patient reports adherence with medication regimen. Patient follows with allergist. -Wean to Solu-medrol daily -Continue Duonebs  AKI (acute kidney injury) (HCC)-resolved as of 05/11/2021 Baseline creatinine of about 1. Peak creatinine of 1.37. Resolved with IV fluids.  Tachycardia Noted to be SVT on telemetry. Transient.  -Continue telemetry  Hyperglycemia Hemoglobin A1C of 4.8 from 09/2020. Possibly related to steroids.  Allergic rhinitis -Continue loratidine     DVT prophylaxis: None Code Status:   Code Status: Full Code Family Communication: Wife at bedside Disposition Plan: Discharge home likely in 24 hours if respiratory status improves, movement closer to baseline, ability to wean off oxygen, PT recommendations   Consultants:  None  Procedures:  None  Antimicrobials: None    Subjective: Breathing is not yet at baseline. Patient states he is about "70%" of baseline. Dyspnea with ambulation to the bathroom. Telemetry with a short run of SVT noted.  Objective: Vitals:   05/11/21 0625 05/11/21 0700 05/11/21 0758  05/11/21 0800  BP:  (!) 81/50 129/80 129/80  Pulse: 78 79 82 80  Resp: 19 20 (!) 21 17  Temp: 98.2 F (36.8 C)  (!) 97.5 F (36.4 C)   TempSrc: Oral  Oral   SpO2: 99% 99% 96% 96%  Weight:      Height:        Intake/Output Summary (Last 24 hours) at 05/11/2021 0934 Last data filed at 05/11/2021 0918 Gross per 24 hour  Intake 430.47 ml  Output 101 ml  Net 329.47 ml   Filed Weights   05/09/21 2347 05/10/21 0329 05/11/21 0500  Weight: 85 kg 77.2 kg 77.2 kg    Examination:  General exam: Appears calm and comfortable Respiratory system: Diminished, poor air movement, wheezing with mild rales. Respiratory effort normal. Cardiovascular system: S1 & S2 heard, RRR. No murmurs, rubs, gallops or clicks. Gastrointestinal system: Abdomen is nondistended, soft and nontender. No organomegaly or masses felt. Normal bowel sounds heard. Central nervous system: Alert and oriented. No focal neurological deficits. Musculoskeletal: No edema. No calf tenderness Skin: No cyanosis. No rashes Psychiatry: Judgement and insight appear normal. Mood & affect appropriate.     Data Reviewed: I have personally reviewed following labs and imaging studies  CBC Lab Results  Component Value Date   WBC 11.2 (H) 05/11/2021   RBC 4.43 05/11/2021   HGB 14.9 05/11/2021   HCT 45.1 05/11/2021   MCV 101.8 (H) 05/11/2021   MCH 33.6 05/11/2021   PLT 187 05/11/2021   MCHC 33.0 05/11/2021   RDW 12.8 05/11/2021   LYMPHSABS 0.6 (L) 05/10/2021   MONOABS 0.3 05/10/2021   EOSABS 0.0 05/10/2021   BASOSABS 0.0 05/10/2021  Last metabolic panel Lab Results  Component Value Date   NA 138 05/11/2021   K 4.6 05/11/2021   CL 107 05/11/2021   CO2 24 05/11/2021   BUN 20 05/11/2021   CREATININE 1.16 05/11/2021   GLUCOSE 130 (H) 05/11/2021   GFRNONAA >60 05/11/2021   GFRAA 71 (L) 04/19/2014   CALCIUM 8.9 05/11/2021   PHOS 3.4 05/10/2021   PROT 6.7 05/11/2021   ALBUMIN 3.7 05/11/2021   BILITOT 1.1  05/11/2021   ALKPHOS 60 05/11/2021   AST 46 (H) 05/11/2021   ALT 32 05/11/2021   ANIONGAP 7 05/11/2021    CBG (last 3)  No results for input(s): GLUCAP in the last 72 hours.   GFR: Estimated Creatinine Clearance: 57.9 mL/min (by C-G formula based on SCr of 1.16 mg/dL).  Coagulation Profile: No results for input(s): INR, PROTIME in the last 168 hours.  Recent Results (from the past 240 hour(s))  Resp Panel by RT-PCR (Flu A&B, Covid) Nasopharyngeal Swab     Status: None   Collection Time: 05/09/21 11:39 PM   Specimen: Nasopharyngeal Swab; Nasopharyngeal(NP) swabs in vial transport medium  Result Value Ref Range Status   SARS Coronavirus 2 by RT PCR NEGATIVE NEGATIVE Final    Comment: (NOTE) SARS-CoV-2 target nucleic acids are NOT DETECTED.  The SARS-CoV-2 RNA is generally detectable in upper respiratory specimens during the acute phase of infection. The lowest concentration of SARS-CoV-2 viral copies this assay can detect is 138 copies/mL. A negative result does not preclude SARS-Cov-2 infection and should not be used as the sole basis for treatment or other patient management decisions. A negative result may occur with  improper specimen collection/handling, submission of specimen other than nasopharyngeal swab, presence of viral mutation(s) within the areas targeted by this assay, and inadequate number of viral copies(<138 copies/mL). A negative result must be combined with clinical observations, patient history, and epidemiological information. The expected result is Negative.  Fact Sheet for Patients:  EntrepreneurPulse.com.au  Fact Sheet for Healthcare Providers:  IncredibleEmployment.be  This test is no t yet approved or cleared by the [place] FDA and  has been authorized for detection and/or diagnosis of SARS-CoV-2 by FDA under an Emergency Use Authorization (EUA). This EUA will remain  in effect (meaning this test can be  used) for the duration of the COVID-19 declaration under Section 564(b)(1) of the Act, 21 U.S.C.section 360bbb-3(b)(1), unless the authorization is terminated  or revoked sooner.       Influenza A by PCR NEGATIVE NEGATIVE Final   Influenza B by PCR NEGATIVE NEGATIVE Final    Comment: (NOTE) The Xpert Xpress SARS-CoV-2/FLU/RSV plus assay is intended as an aid in the diagnosis of influenza from Nasopharyngeal swab specimens and should not be used as a sole basis for treatment. Nasal washings and aspirates are unacceptable for Xpert Xpress SARS-CoV-2/FLU/RSV testing.  Fact Sheet for Patients: EntrepreneurPulse.com.au  Fact Sheet for Healthcare Providers: IncredibleEmployment.be  This test is not yet approved or cleared by the [place] FDA and has been authorized for detection and/or diagnosis of SARS-CoV-2 by FDA under an Emergency Use Authorization (EUA). This EUA will remain in effect (meaning this test can be used) for the duration of the COVID-19 declaration under Section 564(b)(1) of the Act, 21 U.S.C. section 360bbb-3(b)(1), unless the authorization is terminated or revoked.  Performed at [facility], [place] [address] [place], [place] [address]   MRSA Next Gen by PCR, Nasal     Status: None   Collection  Time: 05/10/21  3:27 AM   Specimen: Nasal Mucosa; Nasal Swab  Result Value Ref Range Status   MRSA by PCR Next Gen NOT DETECTED NOT DETECTED Final    Comment: (NOTE) The GeneXpert MRSA Assay (FDA approved for NASAL specimens only), is one component of a comprehensive MRSA colonization surveillance program. It is not intended to diagnose MRSA infection nor to guide or monitor treatment for MRSA infections. Test performance is not FDA approved in patients less than 51 years old. Performed at [facility], [place] [address], [place], [place] [address]   C Difficile Quick Screen w PCR reflex      Status: None   Collection Time: 05/10/21  5:58 PM   Specimen: STOOL  Result Value Ref Range Status   C Diff antigen NEGATIVE NEGATIVE Final   C Diff toxin NEGATIVE NEGATIVE Final   C Diff interpretation No C. difficile detected.  Final    Comment: Performed at [facility], [place] [address] [place],  [address]        Radiology Studies: [facility] Chest Port 1 View  Result Date: 05/10/2021 CLINICAL DATA:  Shortness of breath. EXAM: PORTABLE CHEST 1 VIEW COMPARISON:  November 26, 2017 FINDINGS: The heart size and mediastinal contours are within normal limits. Both lungs are clear. The visualized skeletal structures are unremarkable. IMPRESSION: No active disease. Electronically Signed   By: Virgina Norfolk M.D.   On: 05/10/2021 00:07        Scheduled Meds:  chlorhexidine  15 mL Mouth Rinse BID   Chlorhexidine Gluconate Cloth  6 each Topical Daily   ipratropium-albuterol  3 mL Nebulization TID   loratadine  10 mg Oral Daily   mouth rinse  15 mL Mouth Rinse q12n4p   methylPREDNISolone (SOLU-MEDROL) injection  40 mg Intravenous Q12H   QUEtiapine  25 mg Oral QHS   Continuous Infusions:  lactated ringers 75 mL/hr at 05/10/21 1826     LOS: 1 day     Cordelia Poche, MD [facility] 05/11/2021, 9:34 AM  If 7PM-7AM, please contact night-coverage [email or web address]

## 2021-05-11 NOTE — Hospital Course (Signed)
Mason [name] is a 75 y.o. male with a history of moderate persistent asthma and allergic rhinitis. Patient presented secondary to shortness of breath and was found to have evidence of an asthma exacerbation with associated respiratory failure. Patient required BiPAP initially and has been weaned to oxygen via [place].

## 2021-05-11 NOTE — Plan of Care (Signed)

## 2021-05-11 NOTE — Assessment & Plan Note (Addendum)
Secondary to acute asthma exacerbation. On admission, on venous blood gas, patient with a pCO2 of 63.3 with associated pH of 7.221; no ABG obtained. Initially required BiPAP, weaned to oxygen via [place] and now to room air. PT with no recommendations for discharge.

## 2021-05-11 NOTE — Assessment & Plan Note (Signed)
-  Continue loratidine

## 2021-05-11 NOTE — Assessment & Plan Note (Addendum)
Noted to be SVT on telemetry. Transient.

## 2021-05-11 NOTE — Assessment & Plan Note (Addendum)
Possibly related to viral etiology. Patient reports adherence with medication regimen. Patient follows with allergist. Resolved. Resume home medications for asthma.

## 2021-05-11 NOTE — Assessment & Plan Note (Signed)
Baseline creatinine of about 1. Peak creatinine of 1.37. Resolved with IV fluids.

## 2021-05-11 NOTE — Assessment & Plan Note (Addendum)
Hemoglobin A1C of 4.8 from 09/2020. Possibly related to steroids.

## 2021-05-12 ENCOUNTER — Encounter (HOSPITAL_COMMUNITY): Payer: Self-pay | Admitting: Internal Medicine

## 2021-05-12 DIAGNOSIS — J9601 Acute respiratory failure with hypoxia: Secondary | ICD-10-CM | POA: Diagnosis not present

## 2021-05-12 DIAGNOSIS — N179 Acute kidney failure, unspecified: Secondary | ICD-10-CM | POA: Diagnosis not present

## 2021-05-12 DIAGNOSIS — J4541 Moderate persistent asthma with (acute) exacerbation: Secondary | ICD-10-CM | POA: Diagnosis not present

## 2021-05-12 DIAGNOSIS — K921 Melena: Secondary | ICD-10-CM

## 2021-05-12 DIAGNOSIS — R739 Hyperglycemia, unspecified: Secondary | ICD-10-CM | POA: Diagnosis not present

## 2021-05-12 LAB — CBC
HCT: 44.5 % (ref 39.0–52.0)
Hemoglobin: 15.1 g/dL (ref 13.0–17.0)
MCH: 33.8 pg (ref 26.0–34.0)
MCHC: 33.9 g/dL (ref 30.0–36.0)
MCV: 99.6 fL (ref 80.0–100.0)
Platelets: 192 10*3/uL (ref 150–400)
RBC: 4.47 MIL/uL (ref 4.22–5.81)
RDW: 12.5 % (ref 11.5–15.5)
WBC: 5.5 10*3/uL (ref 4.0–10.5)
nRBC: 0 % (ref 0.0–0.2)

## 2021-05-12 MED ORDER — SODIUM CHLORIDE 0.9 % IV SOLN: INTRAVENOUS | Status: DC

## 2021-05-12 MED ORDER — POLYETHYLENE GLYCOL 3350 17 GM/SCOOP PO POWD
1.0000 | Freq: Once | ORAL | Status: AC
  Administered 2021-05-12: 22:00:00 255 g via ORAL
  Filled 2021-05-12: qty 255

## 2021-05-12 MED ORDER — IPRATROPIUM-ALBUTEROL 0.5-2.5 (3) MG/3ML IN SOLN
3.0000 mL | Freq: Four times a day (QID) | RESPIRATORY_TRACT | Status: DC
  Administered 2021-05-12 – 2021-05-13 (×5): 3 mL via RESPIRATORY_TRACT
  Filled 2021-05-12 (×5): qty 3

## 2021-05-12 MED ORDER — FLEET ENEMA 7-19 GM/118ML RE ENEM
1.0000 | ENEMA | Freq: Once | RECTAL | Status: AC
  Administered 2021-05-13: 06:00:00 1 via RECTAL
  Filled 2021-05-12: qty 1

## 2021-05-12 NOTE — Anesthesia Preprocedure Evaluation (Addendum)
Anesthesia Evaluation  Patient identified by MRN, date of birth, ID band Patient awake    Reviewed: Allergy & Precautions, NPO status , Patient's Chart, lab work & pertinent test results  Airway Mallampati: I  TM Distance: >3 FB Neck ROM: Full    Dental  (+) Edentulous Upper, Edentulous Lower   Pulmonary asthma ,    Pulmonary exam normal breath sounds clear to auscultation       Cardiovascular negative cardio ROS Normal cardiovascular exam Rhythm:Regular Rate:Normal  EKG 05/10/21 NSR, prolonged QT   Neuro/Psych negative neurological ROS  negative psych ROS   GI/Hepatic GERD  Medicated,(+)     substance abuse  alcohol use, Hx/o ETOH use none recently Hematochezia Hx/o colon polyp transverse colon S/P Right  hemicolectomy  Hx/o Pancreatitis- 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   Endo/Other  negative endocrine ROS  Renal/GU negative Renal ROS  negative genitourinary   Musculoskeletal negative musculoskeletal ROS (+)   Abdominal   Peds  Hematology  (+) anemia ,   Anesthesia Other Findings   Reproductive/Obstetrics                            Anesthesia Physical Anesthesia Plan  ASA: 3  Anesthesia Plan: MAC   Post-op Pain Management:    Induction: Intravenous  PONV Risk Score and Plan: 1 and Propofol infusion and Treatment may vary due to age or medical condition  Airway Management Planned: Natural Airway  Additional Equipment:   Intra-op Plan:   Post-operative Plan:   Informed Consent: I have reviewed the patients History and Physical, chart, labs and discussed the procedure including the risks, benefits and alternatives for the proposed anesthesia with the patient or authorized representative who has indicated his/her understanding and acceptance.       Plan Discussed with: Anesthesiologist and CRNA  Anesthesia Plan Comments:        Anesthesia  Quick Evaluation

## 2021-05-12 NOTE — Progress Notes (Signed)
Pt ambulated 2X in hall with NT.  Pt ambulated 18m on 2L of O2 - SpO2 94-98%.   Second time, Pt ambulated with NT in hall.  Pt ambulated 1109m on RA and SpO2 90-95%.   During ambulation, Pt did not experienced shortness of breath.  Pt on RA in room and SpO2 above 90%.  Family at bedside.  Will continue to monitor.

## 2021-05-12 NOTE — Plan of Care (Signed)
  Problem: Education: Goal: Knowledge of General Education information will improve Description: Including pain rating scale, medication(s)/side effects and non-pharmacologic comfort measures Outcome: Progressing   Problem: Coping: Goal: Level of anxiety will decrease Outcome: Progressing   

## 2021-05-12 NOTE — Assessment & Plan Note (Addendum)
Multiple episodes this admission. Hemoglobin currently stable. GI consulted, performed a flexible sigmoidoscopy and removed one polyp. Biopsy pending on discharge.

## 2021-05-12 NOTE — Progress Notes (Signed)
PTwith blood in his stool. Medium sized BM with frank red blood.  MD notified.  Will continue to follow.

## 2021-05-12 NOTE — Consult Note (Signed)
Referring Provider: Dr. Lonny Prude Primary Care Physician:  London Pepper, MD Primary Gastroenterologist:  Dr. Michail Sermon  Reason for Consultation:  Hematochezia; Colon Polyp (follow up)  HPI: Mason [name] is a 75 y.o. male with history of a right hemicolectomy in May due to a transverse colon mass and path showed a large tubular adenoma with high grade dysplasia negative for carcinoma. A large pedunculated colon polyp noted on colonoscopy in Feb 2022 was not removed since the transverse colon polypoid mass was seen and plan was to remove earlier this week as an outpt at the hospital but he was admitted the night before with an acute asthma exacerbation. He was medically managed for that and close to d/c but had a bloody stool yesterday (no documentation of a BM yesterday) and this morning. He says this morning's episode was "20%" of the amount he had yesterday. This morning nurse reports "medium-sized BM with frank red blood." Denies abdominal pain. Undergoing a breathing treatment at this time with RT at bedside.  Past Medical History:  Diagnosis Date   GERD (gastroesophageal reflux disease)    Gout attack 1990   Mass of colon    Necrotizing pancreatitis 03/26/2014   Pancreatitis, acute 07/08/2013   Pancreatitis, gallstone    Seasonal allergies    Seasonal asthma    "related to allergies"    Past Surgical History:  Procedure Laterality Date   CHOLECYSTECTOMY N/A 03/29/2014   Procedure: LAPAROSCOPIC CHOLECYSTECTOMY WITH INTRAOPERATIVE CHOLANGIOGRAM;  Surgeon: Coralie Keens, MD;  Location: [facility];  Service: General;  Laterality: N/A;   CIRCUMCISION  ~ [place] Right 2014   "for tear"   PANCREAS SURGERY      Prior to Admission medications   Medication Sig Start Date End Date Taking? Authorizing Provider  albuterol (PROVENTIL) (2.5 MG/3ML) 0.083% nebulizer solution Take 3 mLs (2.5 mg total) by nebulization every 4 (four)  hours as needed for wheezing or shortness of breath. 04/07/21  Yes Valentina Shaggy, MD  albuterol (VENTOLIN HFA) 108 (90 Base) MCG/ACT inhaler Inhale 2 puffs into the lungs every 4 (four) hours as needed for wheezing or shortness of breath. 04/07/21  Yes Valentina Shaggy, MD  aspirin 500 MG EC tablet Take 500 mg by mouth every 6 (six) hours as needed for pain.   Yes [provider]  Fluticasone-Umeclidin-Vilant (TRELEGY ELLIPTA) 200-62.5-25 MCG/ACT AEPB Inhale 1 puff into the lungs daily. 04/07/21  Yes Valentina Shaggy, MD  EPINEPHrine 0.3 mg/0.3 mL IJ SOAJ injection Inject 0.3 mg into the muscle as needed for anaphylaxis. Patient not taking: Reported on 05/11/2021 04/07/21   Valentina Shaggy, MD  levocetirizine (XYZAL) 5 MG tablet Take 1 tablet (5 mg total) by mouth 2 (two) times daily as needed for allergies (Can use an extran dose during flares). Patient not taking: Reported on 05/04/2021 04/07/21   Valentina Shaggy, MD    Scheduled Meds:  chlorhexidine  15 mL Mouth Rinse BID   ipratropium-albuterol  3 mL Nebulization QID   loratadine  10 mg Oral Daily   mouth rinse  15 mL Mouth Rinse q12n4p   methylPREDNISolone (SOLU-MEDROL) injection  40 mg Intravenous Daily   polyethylene glycol powder  1 Container Oral Once   QUEtiapine  25 mg Oral QHS   [START ON 05/13/2021] sodium phosphate  1 enema Rectal Once   Continuous Infusions: PRN Meds:.acetaminophen **OR** acetaminophen, albuterol, ALPRAZolam, haloperidol lactate, ondansetron (ZOFRAN) IV, phenol  Allergies  as of 05/09/2021   (No Known Allergies)    History reviewed. No pertinent family history.  Social History   Socioeconomic History   Marital status: Married    Spouse name: Not on file   Number of children: Not on file   Years of education: Not on file   Highest education level: Not on file  Occupational History   Not on file  Tobacco Use   Smoking status: Never   Smokeless tobacco: Never   Vaping Use   Vaping Use: Never used  Substance and Sexual Activity   Alcohol use: Yes    Comment: 2 beer a month   Drug use: No   Sexual activity: Yes  Other Topics Concern   Not on file  Social History Narrative   Not on file   Social Determinants of Health   Financial Resource Strain: Not on file  Food Insecurity: Not on file  Transportation Needs: Not on file  Physical Activity: Not on file  Stress: Not on file  Social Connections: Not on file  Intimate Partner Violence: Not on file    Review of Systems: All negative except as stated above in HPI.  Physical Exam: Vital signs: Vitals:   05/12/21 1356 05/12/21 1629  BP: (!) 145/80   Pulse: 88   Resp: 18   Temp: 98.9 F (37.2 C)   SpO2: 93% 93%   Last BM Date: 05/12/21 General:   Lethargic, elderly, thin, no acute distress, pleasant  Head: normocephalic, atraumatic Eyes: anicteric sclera ENT: oropharynx clear Neck: supple, nontender Lungs:  Clear throughout to auscultation.   No wheezes, crackles, or rhonchi. No acute distress. Heart:  Regular rate and rhythm; no murmurs, clicks, rubs,  or gallops. Abdomen: soft, nontender, nondistended, +BS  Rectal:  Deferred Ext: no edema  GI:  Lab Results: Recent Labs    05/10/21 0313 05/11/21 0319 05/12/21 1436  WBC 10.4 11.2* 5.5  HGB 16.6 14.9 15.1  HCT 49.2 45.1 44.5  PLT 191 187 192   BMET Recent Labs    05/09/21 2352 05/10/21 0313 05/11/21 0319  NA 139 140 138  K 4.1 4.2 4.6  CL 109 109 107  CO2 23 21* 24  GLUCOSE 136* 162* 130*  BUN 18 18 20   CREATININE 1.31* 1.37* 1.16  CALCIUM 9.0 9.0 8.9   LFT Recent Labs    05/11/21 0319  PROT 6.7  ALBUMIN 3.7  AST 46*  ALT 32  ALKPHOS 60  BILITOT 1.1   PT/INR No results for input(s): LABPROT, INR in the last 72 hours.   Studies/Results: No results found.  Impression/Plan: Hematochezia with known large pedunculated sigmoid colon polyp that might be the source of the bleeding. Previous flex  sig for removal of polyp was cancelled due to admission day before with his asthma exacerbation. History of right hemicolectomy (see above). Respiratory status has stabilized and with onset of rectal bleeding will do a flex sig tomorrow with Miralax prep and Fleets enema. Clear liquid diet today for dinner and NPO p MN.    LOS: 2 days   Lear Ng  05/12/2021, 4:46 PM  Questions please call [phone]

## 2021-05-12 NOTE — Progress Notes (Signed)
PROGRESS NOTE    Mason [name]  WUJ:[id number] DOB: [birth year] DOA: 05/09/2021 PCP: London Pepper, MD   Brief Narrative: Mason [name] is a 75 y.o. male with a history of moderate persistent asthma and allergic rhinitis. Patient presented secondary to shortness of breath and was found to have evidence of an asthma exacerbation with associated respiratory failure. Patient required BiPAP initially and has been weaned to oxygen via [place].   Assessment & Plan:   * Acute respiratory failure with hypoxia and hypercapnia (HCC)-resolved as of 05/12/2021, (present on admission) Secondary to acute asthma exacerbation. On admission, on venous blood gas, patient with a pCO2 of 63.3 with associated pH of 7.221; no ABG obtained. Initially required BiPAP, weaned to oxygen via [place] and now to room air. PT with no recommendations for discharge.  Acute asthma exacerbation- (present on admission) Possibly related to viral etiology. Patient reports adherence with medication regimen. Patient follows with allergist. Improved. -Solu-medrol daily -Continue Duonebs  AKI (acute kidney injury) (HCC)-resolved as of 05/11/2021 Baseline creatinine of about 1. Peak creatinine of 1.37. Resolved with IV fluids.  Tachycardia Noted to be SVT on telemetry. Transient.  -Continue telemetry  Hyperglycemia Hemoglobin A1C of 4.8 from 09/2020. Possibly related to steroids.  Allergic rhinitis- (present on admission) -Continue loratidine  Hematochezia Multiple episodes this admission. Hemoglobin currently stable. Discussed with GI who plan for flex sig tomorrow, 12/23. -CBC in AM     DVT prophylaxis: None Code Status:   Code Status: Full Code Family Communication: Wife at bedside Disposition Plan: Discharge home in 24 hours pending GI recommendations/management.   Consultants:  [facility]  Procedures:  None  Antimicrobials: None    Subjective: Episode of significant wheezing this morning which has  resolved with breathing treatments. Reports blood in stool as well.  Objective: Vitals:   05/12/21 0931 05/12/21 1139 05/12/21 1318 05/12/21 1356  BP:    (!) 145/80  Pulse:    88  Resp:    18  Temp:    98.9 F (37.2 C)  TempSrc:    Oral  SpO2: (!) 89% 91% (!) 89% 93%  Weight:      Height:        Intake/Output Summary (Last 24 hours) at 05/12/2021 1623 Last data filed at 05/12/2021 0900 Gross per 24 hour  Intake 240 ml  Output 250 ml  Net -10 ml    Filed Weights   05/10/21 0329 05/11/21 0500 05/12/21 0500  Weight: 77.2 kg 77.2 kg 80.1 kg    Examination:  General exam: Appears calm and comfortable Respiratory system: Mild wheezing, no rhonchi or rales. Respiratory effort normal. Cardiovascular system: S1 & S2 heard, RRR. No murmurs, rubs, gallops or clicks. Gastrointestinal system: Abdomen is nondistended, soft and nontender. No organomegaly or masses felt. Normal bowel sounds heard. Central nervous system: Alert and oriented. No focal neurological deficits. Musculoskeletal: No edema. No calf tenderness Skin: No cyanosis. No rashes Psychiatry: Judgement and insight appear normal. Mood & affect appropriate.     Data Reviewed: I have personally reviewed following labs and imaging studies  CBC Lab Results  Component Value Date   WBC 5.5 05/12/2021   RBC 4.47 05/12/2021   HGB 15.1 05/12/2021   HCT 44.5 05/12/2021   MCV 99.6 05/12/2021   MCH 33.8 05/12/2021   PLT 192 05/12/2021   MCHC 33.9 05/12/2021   RDW 12.5 05/12/2021   LYMPHSABS 0.6 (L) 05/10/2021   MONOABS 0.3 05/10/2021   EOSABS 0.0 05/10/2021   BASOSABS 0.0 05/10/2021  Last metabolic panel Lab Results  Component Value Date   NA 138 05/11/2021   K 4.6 05/11/2021   CL 107 05/11/2021   CO2 24 05/11/2021   BUN 20 05/11/2021   CREATININE 1.16 05/11/2021   GLUCOSE 130 (H) 05/11/2021   GFRNONAA >60 05/11/2021   GFRAA 71 (L) 04/19/2014   CALCIUM 8.9 05/11/2021   PHOS 3.4 05/10/2021   PROT 6.7  05/11/2021   ALBUMIN 3.7 05/11/2021   BILITOT 1.1 05/11/2021   ALKPHOS 60 05/11/2021   AST 46 (H) 05/11/2021   ALT 32 05/11/2021   ANIONGAP 7 05/11/2021    CBG (last 3)  No results for input(s): GLUCAP in the last 72 hours.   GFR: Estimated Creatinine Clearance: 57.9 mL/min (by C-G formula based on SCr of 1.16 mg/dL).  Coagulation Profile: No results for input(s): INR, PROTIME in the last 168 hours.  Recent Results (from the past 240 hour(s))  Resp Panel by RT-PCR (Flu A&B, Covid) Nasopharyngeal Swab     Status: None   Collection Time: 05/09/21 11:39 PM   Specimen: Nasopharyngeal Swab; Nasopharyngeal(NP) swabs in vial transport medium  Result Value Ref Range Status   SARS Coronavirus 2 by RT PCR NEGATIVE NEGATIVE Final    Comment: (NOTE) SARS-CoV-2 target nucleic acids are NOT DETECTED.  The SARS-CoV-2 RNA is generally detectable in upper respiratory specimens during the acute phase of infection. The lowest concentration of SARS-CoV-2 viral copies this assay can detect is 138 copies/mL. A negative result does not preclude SARS-Cov-2 infection and should not be used as the sole basis for treatment or other patient management decisions. A negative result may occur with  improper specimen collection/handling, submission of specimen other than nasopharyngeal swab, presence of viral mutation(s) within the areas targeted by this assay, and inadequate number of viral copies(<138 copies/mL). A negative result must be combined with clinical observations, patient history, and epidemiological information. The expected result is Negative.  Fact Sheet for Patients:  EntrepreneurPulse.com.au  Fact Sheet for Healthcare Providers:  IncredibleEmployment.be  This test is no t yet approved or cleared by the [place] FDA and  has been authorized for detection and/or diagnosis of SARS-CoV-2 by FDA under an Emergency Use Authorization (EUA). This EUA  will remain  in effect (meaning this test can be used) for the duration of the COVID-19 declaration under Section 564(b)(1) of the Act, 21 U.S.C.section 360bbb-3(b)(1), unless the authorization is terminated  or revoked sooner.       Influenza A by PCR NEGATIVE NEGATIVE Final   Influenza B by PCR NEGATIVE NEGATIVE Final    Comment: (NOTE) The Xpert Xpress SARS-CoV-2/FLU/RSV plus assay is intended as an aid in the diagnosis of influenza from Nasopharyngeal swab specimens and should not be used as a sole basis for treatment. Nasal washings and aspirates are unacceptable for Xpert Xpress SARS-CoV-2/FLU/RSV testing.  Fact Sheet for Patients: EntrepreneurPulse.com.au  Fact Sheet for Healthcare Providers: IncredibleEmployment.be  This test is not yet approved or cleared by the [place] FDA and has been authorized for detection and/or diagnosis of SARS-CoV-2 by FDA under an Emergency Use Authorization (EUA). This EUA will remain in effect (meaning this test can be used) for the duration of the COVID-19 declaration under Section 564(b)(1) of the Act, 21 U.S.C. section 360bbb-3(b)(1), unless the authorization is terminated or revoked.  Performed at [facility], [place] [address] [place], [place] [address]   MRSA Next Gen by PCR, Nasal     Status: None   Collection  Time: 05/10/21  3:27 AM   Specimen: Nasal Mucosa; Nasal Swab  Result Value Ref Range Status   MRSA by PCR Next Gen NOT DETECTED NOT DETECTED Final    Comment: (NOTE) The GeneXpert MRSA Assay (FDA approved for NASAL specimens only), is one component of a comprehensive MRSA colonization surveillance program. It is not intended to diagnose MRSA infection nor to guide or monitor treatment for MRSA infections. Test performance is not FDA approved in patients less than 86 years old. Performed at [facility], [place] [address] [place], [place]  [address]   C Difficile Quick Screen w PCR reflex     Status: None   Collection Time: 05/10/21  5:58 PM   Specimen: STOOL  Result Value Ref Range Status   C Diff antigen NEGATIVE NEGATIVE Final   C Diff toxin NEGATIVE NEGATIVE Final   C Diff interpretation No C. difficile detected.  Final    Comment: Performed at [facility], [place] [address] [place], [place] [address]        Radiology Studies: No results found.      Scheduled Meds:  chlorhexidine  15 mL Mouth Rinse BID   ipratropium-albuterol  3 mL Nebulization QID   loratadine  10 mg Oral Daily   mouth rinse  15 mL Mouth Rinse q12n4p   methylPREDNISolone (SOLU-MEDROL) injection  40 mg Intravenous Daily   QUEtiapine  25 mg Oral QHS   Continuous Infusions:     LOS: 2 days     Cordelia Poche, MD [facility] 05/12/2021, 4:23 PM  If 7PM-7AM, please contact night-coverage [email or web address]

## 2021-05-12 NOTE — Evaluation (Signed)
Physical Therapy Evaluation Patient Details Name: Mason [name] MRN: [id number] DOB: [birth year] Today's Date: 05/12/2021  History of Present Illness  75 y.o. male with a history of moderate persistent asthma and allergic rhinitis. Patient presented secondary to shortness of breath and was found to have evidence of an asthma exacerbation with associated respiratory failure.  Clinical Impression  Pt is independent with mobility, he ambulated 370' without an assistive device, no loss of balance, SpO2 91% on room air. He is ready to DC home from a PT standpoint, no further PT indicated, will sign off.         Recommendations for follow up therapy are one component of a multi-disciplinary discharge planning process, led by the attending physician.  Recommendations may be updated based on patient status, additional functional criteria and insurance authorization.  Follow Up Recommendations No PT follow up    Assistance Recommended at Discharge None  Functional Status Assessment Patient has not had a recent decline in their functional status  Equipment Recommendations  None recommended by PT    Recommendations for Other Services       Precautions / Restrictions Precautions Precautions: None Restrictions Weight Bearing Restrictions: No      Mobility  Bed Mobility Overal bed mobility: Independent                  Transfers Overall transfer level: Independent                      Ambulation/Gait Ambulation/Gait assistance: Independent Gait Distance (Feet): 370 Feet Assistive device: None Gait Pattern/deviations: WFL(Within Functional Limits) Gait velocity: WNL     General Gait Details: steady, no loss of balance, 2/4 dyspnea/mild wheezing, SpO2 91% room air  Stairs            Wheelchair Mobility    Modified Rankin (Stroke Patients Only)       Balance Overall balance assessment: Independent                                            Pertinent Vitals/Pain Pain Assessment: No/denies pain    Home Living Family/patient expects to be discharged to:: Private residence Living Arrangements: Spouse/significant other Available Help at Discharge: Family Type of Home: House Home Access: Stairs to enter Entrance Stairs-Rails: None Entrance Stairs-Number of Steps: 4 Alternate Level Stairs-Number of Steps: flight Home Layout: Bed/bath upstairs;Two level Home Equipment: None      Prior Function Prior Level of Function : Independent/Modified Independent             Mobility Comments: walks 2 laps at [employer], does yard work, no assistive device ADLs Comments: independent     Journalist, newspaper        Extremity/Trunk Assessment   Upper Extremity Assessment Upper Extremity Assessment: Overall WFL for tasks assessed    Lower Extremity Assessment Lower Extremity Assessment: Overall WFL for tasks assessed    Cervical / Trunk Assessment Cervical / Trunk Assessment: Normal  Communication   Communication: No difficulties  Cognition Arousal/Alertness: Awake/alert Behavior During Therapy: WFL for tasks assessed/performed Overall Cognitive Status: Within Functional Limits for tasks assessed                                          General Comments  Exercises     Assessment/Plan    PT Assessment Patient does not need any further PT services  PT Problem List         PT Treatment Interventions      PT Goals (Current goals can be found in the Care Plan section)  Acute Rehab PT Goals PT Goal Formulation: All assessment and education complete, DC therapy    Frequency     Barriers to discharge        Co-evaluation               AM-PAC PT "6 Clicks" Mobility  Outcome Measure Help needed turning from your back to your side while in a flat bed without using bedrails?: None Help needed moving from lying on your back to sitting on the side of a flat bed without using  bedrails?: None Help needed moving to and from a bed to a chair (including a wheelchair)?: None Help needed standing up from a chair using your arms (e.g., wheelchair or bedside chair)?: None Help needed to walk in hospital room?: None Help needed climbing 3-5 steps with a railing? : None 6 Click Score: 24    End of Session   Activity Tolerance: Patient tolerated treatment well Patient left: in chair;with call bell/phone within reach;with family/visitor present Nurse Communication: Mobility status      Time: 1031-5945 PT Time Calculation (min) (ACUTE ONLY): 11 min   Charges:   PT Evaluation $PT Eval Low Complexity: 1 Low         Philomena Doheny PT 05/12/2021  Acute Rehabilitation Services Pager [phone] Office [phone]

## 2021-05-13 ENCOUNTER — Inpatient Hospital Stay (HOSPITAL_COMMUNITY): Payer: Medicare Other | Admitting: Anesthesiology

## 2021-05-13 ENCOUNTER — Inpatient Hospital Stay (HOSPITAL_COMMUNITY): Payer: Medicare Other

## 2021-05-13 ENCOUNTER — Encounter (HOSPITAL_COMMUNITY)
Admission: EM | Disposition: A | Discharge status: Home / Self Care | Payer: Self-pay | Source: Home / Self Care | Attending: Family Medicine

## 2021-05-13 ENCOUNTER — Encounter (HOSPITAL_COMMUNITY): Payer: Self-pay | Admitting: Internal Medicine

## 2021-05-13 DIAGNOSIS — J9601 Acute respiratory failure with hypoxia: Secondary | ICD-10-CM | POA: Diagnosis not present

## 2021-05-13 DIAGNOSIS — J9602 Acute respiratory failure with hypercapnia: Secondary | ICD-10-CM | POA: Diagnosis not present

## 2021-05-13 DIAGNOSIS — T17908A Unspecified foreign body in respiratory tract, part unspecified causing other injury, initial encounter: Secondary | ICD-10-CM

## 2021-05-13 HISTORY — PX: HEMOSTASIS CONTROL: SHX6838

## 2021-05-13 HISTORY — PX: POLYPECTOMY: SHX5525

## 2021-05-13 HISTORY — PX: FLEXIBLE SIGMOIDOSCOPY: SHX5431

## 2021-05-13 HISTORY — PX: HEMOSTASIS CLIP PLACEMENT: SHX6857

## 2021-05-13 LAB — CBC
HCT: 46 % (ref 39.0–52.0)
Hemoglobin: 15.5 g/dL (ref 13.0–17.0)
MCH: 34 pg (ref 26.0–34.0)
MCHC: 33.7 g/dL (ref 30.0–36.0)
MCV: 100.9 fL — ABNORMAL HIGH (ref 80.0–100.0)
Platelets: 191 10*3/uL (ref 150–400)
RBC: 4.56 MIL/uL (ref 4.22–5.81)
RDW: 12.3 % (ref 11.5–15.5)
WBC: 8.8 10*3/uL (ref 4.0–10.5)
nRBC: 0 % (ref 0.0–0.2)

## 2021-05-13 SURGERY — SIGMOIDOSCOPY, FLEXIBLE
Anesthesia: Monitor Anesthesia Care

## 2021-05-13 MED ORDER — LACTATED RINGERS IV SOLN: INTRAVENOUS | Status: DC

## 2021-05-13 MED ORDER — EPINEPHRINE 1 MG/10ML IJ SOSY
PREFILLED_SYRINGE | INTRAMUSCULAR | Status: AC
  Filled 2021-05-13: qty 10

## 2021-05-13 MED ORDER — LACTATED RINGERS IV SOLN: INTRAVENOUS | Status: DC | PRN

## 2021-05-13 MED ORDER — PROPOFOL 500 MG/50ML IV EMUL
INTRAVENOUS | Status: DC | PRN
  Administered 2021-05-13: 200 ug/kg/min via INTRAVENOUS

## 2021-05-13 MED ORDER — PHENYLEPHRINE HCL (PRESSORS) 10 MG/ML IV SOLN
INTRAVENOUS | Status: DC | PRN
  Administered 2021-05-13 (×4): 120 ug via INTRAVENOUS
  Administered 2021-05-13: 80 ug via INTRAVENOUS

## 2021-05-13 MED ORDER — LIDOCAINE HCL (CARDIAC) PF 100 MG/5ML IV SOSY: PREFILLED_SYRINGE | INTRAVENOUS | Status: DC | PRN

## 2021-05-13 MED ORDER — SODIUM CHLORIDE (PF) 0.9 % IJ SOLN
PREFILLED_SYRINGE | INTRAMUSCULAR | Status: DC | PRN
  Administered 2021-05-13: 09:00:00 3 mL

## 2021-05-13 MED ORDER — ALBUTEROL SULFATE (2.5 MG/3ML) 0.083% IN NEBU
INHALATION_SOLUTION | RESPIRATORY_TRACT | Status: AC
  Filled 2021-05-13: qty 3

## 2021-05-13 MED ORDER — IPRATROPIUM-ALBUTEROL 0.5-2.5 (3) MG/3ML IN SOLN: 3.0000 mL | Freq: Three times a day (TID) | RESPIRATORY_TRACT | Status: DC

## 2021-05-13 MED ORDER — PROPOFOL 1000 MG/100ML IV EMUL
INTRAVENOUS | Status: AC
  Filled 2021-05-13: qty 100

## 2021-05-13 NOTE — Discharge Instructions (Signed)
Mason [name],  You were in the hospital with an asthma exacerbation which has been treated and is resolved. While you were here, you also had a flexible sigmoidoscopy to remove a bleeding polyp. Please follow-up with the GI doctor for your biopsy results. Please follow-up with your primary care physician for a hospital follow-up.

## 2021-05-13 NOTE — Care Management Important Message (Signed)
Important Message  Patient Details IM Letter given to the Patient. Name: Mason [name] MRN: [id number] Date of Birth: 05-06-1946   Medicare Important Message Given:  Yes     Kerin Salen 05/13/2021, 2:00 PM

## 2021-05-13 NOTE — Assessment & Plan Note (Signed)
Possible. Patient had multiple episodes of emesis post-extubation. Asymptomatic and chest x-ray is unremarkable. Mild rales on exam. Incentive spirometer ordered. Precautions discussed.

## 2021-05-13 NOTE — Plan of Care (Signed)
°  Problem: Elimination: °Goal: Will not experience complications related to bowel motility °Outcome: Progressing °  °Problem: Pain Managment: °Goal: General experience of comfort will improve °Outcome: Progressing °  °

## 2021-05-13 NOTE — Progress Notes (Signed)
Patient discharged per order, IV removed previously by wife, discharge instructions reviewed with patient, incentive spirometer given to patient with instructions for use.

## 2021-05-13 NOTE — Anesthesia Procedure Notes (Signed)
Procedure Name: MAC Date/Time: 05/13/2021 8:46 AM Performed by: Lissa Morales, CRNA Pre-anesthesia Checklist: Patient identified, Emergency Drugs available, Suction available and Patient being monitored Patient Re-evaluated:Patient Re-evaluated prior to induction Oxygen Delivery Method: Simple face mask Placement Confirmation: positive ETCO2

## 2021-05-13 NOTE — TOC Initial Note (Signed)
Transition of Care [facility]) - Initial/Assessment Note    Patient Details  Name: Mason [name] MRN: [id number] Date of Birth: Jun 04, 1945  Transition of Care [facility]) CM/SW Contact:    Leeroy Cha, RN Phone Number: 05/13/2021, 9:21 AM  Clinical Narrative:                  Transition of Care [facility]) Screening Note   Patient Details  Name: Mason [name] Date of Birth: Mar 01, 1946   Transition of Care [facility]) CM/SW Contact:    Leeroy Cha, RN Phone Number: 05/13/2021, 9:21 AM    Transition of Care Department [facility]) has reviewed patient and no TOC needs have been identified at this time. We will continue to monitor patient advancement through interdisciplinary progression rounds. If new patient transition needs arise, please place a TOC consult.    Expected Discharge Plan: Home/Self Care Barriers to Discharge: Continued Medical Work up   Patient Goals and CMS Choice Patient states their goals for this hospitalization and ongoing recovery are:: not stated CMS Medicare.gov Compare Post Acute Care list provided to:: Patient Choice offered to / list presented to : Patient  Expected Discharge Plan and Services Expected Discharge Plan: Home/Self Care   Discharge Planning Services: CM Consult   Living arrangements for the past 2 months: Single Family Home                                      Prior Living Arrangements/Services Living arrangements for the past 2 months: Single Family Home Lives with:: Spouse Patient language and need for interpreter reviewed:: Yes Do you feel safe going back to the place where you live?: Yes            Criminal Activity/Legal Involvement Pertinent to Current Situation/Hospitalization: No - Comment as needed  Activities of Daily Living Home Assistive Devices/Equipment: Dentures (specify type), Eyeglasses, Cane (specify quad or straight) ADL Screening (condition at time of admission) Patient's cognitive ability adequate to safely  complete daily activities?: Yes Is the patient deaf or have difficulty hearing?: No Does the patient have difficulty seeing, even when wearing glasses/contacts?: Yes Does the patient have difficulty concentrating, remembering, or making decisions?: No Patient able to express need for assistance with ADLs?: Yes Does the patient have difficulty dressing or bathing?: No Independently performs ADLs?: Yes (appropriate for developmental age) Does the patient have difficulty walking or climbing stairs?: No Weakness of Legs: None Weakness of Arms/Hands: None  Permission Sought/Granted                  Emotional Assessment Appearance:: Appears stated age Attitude/Demeanor/Rapport: Engaged Affect (typically observed): Calm Orientation: : Oriented to Self, Oriented to Place, Oriented to  Time, Oriented to Situation Alcohol / Substance Use: Not Applicable Psych Involvement: No (comment)  Admission diagnosis:  Severe persistent asthma with exacerbation [J45.51] Acute asthma exacerbation [J45.901] Acute respiratory failure with hypoxia and hypercapnia ([place]) [J96.01, J96.02] Patient Active Problem List   Diagnosis Date Noted   Hematochezia 05/12/2021   Hyperglycemia 05/11/2021   Tachycardia 05/11/2021   Acute asthma exacerbation 05/10/2021   Allergic rhinitis 05/10/2021   Elevated PSA 09/30/2020   Gastroesophageal reflux disease 09/30/2020   Hyperlipidemia 09/30/2020   Mild persistent asthma 09/30/2020   Knee pain 09/30/2020   Amnesia 09/30/2020   ED (erectile dysfunction) of organic origin 09/30/2020   Mass of hepatic flexure s/p robotic proximal right colectomy 09/29/2020 09/29/2020  High grade dysplasia in colonic adenoma 07/27/2020   Chronic thrombosis of splenic vein after pancreatitis 2015 07/28/2014   Cyst of pancreas 06/22/2014   Acute pancreatitis 04/28/2014   GI bleed 04/14/2014   Anemia 04/14/2014   Sepsis ([place]) 04/08/2014   Abnormal abdominal CT scan 04/07/2014    Right otitis media 04/02/2014   Fever 04/01/2014   History of alcohol use, denies any recent alcohol use 03/26/2014   SOB (shortness of breath) 07/17/2013   Intermittent fever 07/17/2013   Seasonal asthma 07/17/2013   Fever, unspecified 07/11/2013   Constipation 07/11/2013   Cardiomegaly 07/10/2013   Abnormal ultrasound of abdomen 07/09/2013   Abdominal pain, acute, periumbilical 44/81/8563   Nausea & vomiting 07/08/2013   Overweight (BMI 25.0-29.9) 07/08/2013   PCP:  London Pepper, MD Pharmacy:   [facility] [place], [place] - [address] AT [place] [address] Phone: [phone] Fax: [phone]     Social Determinants of Health (SDOH) Interventions    Readmission Risk Interventions No flowsheet data found.

## 2021-05-13 NOTE — Anesthesia Postprocedure Evaluation (Signed)
Anesthesia Post Note  Patient: Mason [name]  Procedure(s) Performed: FLEXIBLE SIGMOIDOSCOPY POLYPECTOMY HEMOSTASIS CONTROL HEMOSTASIS CLIP PLACEMENT     Patient location during evaluation: PACU Anesthesia Type: MAC Level of consciousness: awake and alert Pain management: pain level controlled Vital Signs Assessment: post-procedure vital signs reviewed and stable Respiratory status: spontaneous breathing, nonlabored ventilation and respiratory function stable Cardiovascular status: stable and blood pressure returned to baseline Postop Assessment: no apparent nausea or vomiting Anesthetic complications: no   No notable events documented.  Last Vitals:  Vitals:   05/13/21 1137 05/13/21 1314  BP:  124/74  Pulse:  80  Resp:  20  Temp:  36.7 C  SpO2: 96% 94%    Last Pain:  Vitals:   05/13/21 1314  TempSrc: Oral  PainSc:                  [name]

## 2021-05-13 NOTE — Progress Notes (Signed)
Patient ambulated in hallway - about 300 feet.  O2 sat was between 91-94%.  No distress noted, patient tolerated well.

## 2021-05-13 NOTE — Transfer of Care (Signed)
Immediate Anesthesia Transfer of Care Note  Patient: Mason [name]  Procedure(s) Performed: FLEXIBLE SIGMOIDOSCOPY POLYPECTOMY HEMOSTASIS CONTROL HEMOSTASIS CLIP PLACEMENT  Patient Location: PACU  Anesthesia Type:MAC  Level of Consciousness: awake, alert  and patient cooperative  Airway & Oxygen Therapy: Patient Spontanous Breathing and Patient connected to face mask oxygen  Post-op Assessment: Report given to RN, Post -op Vital signs reviewed and stable and Patient moving all extremities X 4  Post vital signs: stable Dr. Tobias Alexander called about gastric sections suctioned oralpharymx and breathing tx  Last Vitals:  Vitals Value Taken Time  BP    Temp    Pulse    Resp    SpO2      Last Pain:  Vitals:   05/13/21 0737  TempSrc: Oral  PainSc: 0-No pain         Complications: No notable events documented.

## 2021-05-13 NOTE — Discharge Summary (Signed)
Physician Discharge Summary  Mason [name] DZH:[id number] DOB: Jul 03, 1945 DOA: 05/09/2021  PCP: Mason Pepper, MD  Admit date: 05/09/2021 Discharge date: 05/13/2021  Admitted From: Home Disposition: Home  Recommendations for Outpatient Follow-up:  Follow up with PCP in 1 week Follow up with GI for biopsy results Please follow up on the following pending results: sigmoidoscopy biopsy  Home Health: None Equipment/Devices: None  Discharge Condition: Stable CODE STATUS: Full code Diet recommendation: Full diet, advance as tolerated   Brief/Interim Summary:  Admission HPI written by Mason Mura, DO   HPI: Mason [name] is a 75 y.o. male with medical history significant for moderate persistent asthma, allergic rhinitis, who is admitted to [name] on 05/09/2021 with acute hypoxic hypercapnic respiratory failure in the setting of acute asthma exacerbation after presenting from home to [name] ED complaining of shortness of breath.    Patient has been experiencing 2 to 3 days of progressive shortness of breath associated with wheezing, in the absence of sore throat.  He denies any associated chest pain, shortness of breath, diaphoresis, nausea, vomiting.  Also denies any recent orthopnea, PND, or worsening of peripheral edema no hemoptysis, new calf tenderness, new lower extremity erythema.  No recent trauma or travel.  He notes good compliance with home Trelegy Ellipta, and conveys increasing frequency of use of prn albuterol inhaler over the last few days, without any significant improvement in his shortness of breath with this measure.  He acknowledges a history of allergic rhinitis, conveys that his asthma appears to flare with worsening allergies.  He conveys that he is previously on levocetirizine, but no longer takes this medication.  Confirms no known baseline supplemental oxygen requirements.  Lifelong non-smoker.  No recent subjective fever, chills, rigors, or  generalized myalgias.   In the setting of further progression of his shortness of breath, EMS was contacted noted initial O2 sats to be in the low 80s on room air, with EMS consistently bringing the patient to [name] emergency department for further evaluation management thereof, with administration of the following in route to the [name]: IV magnesium, Solu-Medrol, albuterol nebulizer, Atrovent inhaler.    [name] course:  * Acute respiratory failure with hypoxia and hypercapnia (HCC)-resolved as of 05/12/2021, (present on admission) Secondary to acute asthma exacerbation. On admission, on venous blood gas, patient with a pCO2 of 63.3 with associated pH of 7.221; no ABG obtained. Initially required BiPAP, weaned to oxygen via [place] and now to room air. PT with no recommendations for discharge.  AKI (acute kidney injury) (HCC)-resolved as of 05/11/2021 Baseline creatinine of about 1. Peak creatinine of 1.37. Resolved with IV fluids.  Acute asthma exacerbation-resolved as of 05/13/2021, (present on admission) Possibly related to viral etiology. Patient reports adherence with medication regimen. Patient follows with allergist. Resolved. Resume home medications for asthma.  Hematochezia Multiple episodes this admission. Hemoglobin currently stable. GI consulted, performed a flexible sigmoidoscopy and removed one polyp. Biopsy pending on discharge.  Tachycardia-resolved as of 05/13/2021 Noted to be SVT on telemetry. Transient.   Aspiration into airway Possible. Patient had multiple episodes of emesis post-extubation. Asymptomatic and chest x-ray is unremarkable. Mild rales on exam. Incentive spirometer ordered. Precautions discussed.  Hyperglycemia Hemoglobin A1C of 4.8 from 09/2020. Possibly related to steroids.  Allergic rhinitis- (present on admission) -Continue loratidine     Discharge Diagnoses:  Active Problems:   Acute asthma exacerbation   Tachycardia   Hematochezia    Allergic rhinitis   Hyperglycemia   SOB (shortness  of breath)    Discharge Instructions  Discharge Instructions     Increase activity slowly   Complete by: As directed       Allergies as of 05/13/2021   No Known Allergies      Medication List     STOP taking these medications    aspirin 500 MG EC tablet       TAKE these medications    albuterol (2.5 MG/3ML) 0.083% nebulizer solution Commonly known as: PROVENTIL Take 3 mLs (2.5 mg total) by nebulization every 4 (four) hours as needed for wheezing or shortness of breath.   albuterol 108 (90 Base) MCG/ACT inhaler Commonly known as: VENTOLIN HFA Inhale 2 puffs into the lungs every 4 (four) hours as needed for wheezing or shortness of breath.   EPINEPHrine 0.3 mg/0.3 mL Soaj injection Commonly known as: EPI-PEN Inject 0.3 mg into the muscle as needed for anaphylaxis.   levocetirizine 5 MG tablet Commonly known as: XYZAL Take 1 tablet (5 mg total) by mouth 2 (two) times daily as needed for allergies (Can use an extran dose during flares).   Trelegy Ellipta 200-62.5-25 MCG/ACT Aepb Generic drug: Fluticasone-Umeclidin-Vilant Inhale 1 puff into the lungs daily.        Follow-up Information     Mason Pepper, MD. Schedule an appointment as soon as possible for a visit in 1 week(s).   Specialty: Family Medicine Why: For [name] follow-up Contact information: [place] [address] [phone]         Mason Corner, MD Follow up.   Specialty: Gastroenterology Why: As needed. Biopsy results. Contact information: [address] [place] [address] [phone]                No Known Allergies  Consultations: [name]   Procedures/Studies: DG CHEST PORT 1 VIEW  Result Date: 05/13/2021 CLINICAL DATA:  Aspiration EXAM: PORTABLE CHEST 1 VIEW COMPARISON:  None. FINDINGS: Normal mediastinum and cardiac silhouette. Normal  pulmonary vasculature. No evidence of effusion, infiltrate, or pneumothorax. No acute bony abnormality. IMPRESSION: Normal chest radiograph. Electronically Signed   By: [name] M.D.   On: 05/13/2021 13:36   DG Chest Port 1 View  Result Date: 05/10/2021 CLINICAL DATA:  Shortness of breath. EXAM: PORTABLE CHEST 1 VIEW COMPARISON:  November 26, 2017 FINDINGS: The heart size and mediastinal contours are within normal limits. Both lungs are clear. The visualized skeletal structures are unremarkable. IMPRESSION: No active disease. Electronically Signed   By: Virgina Norfolk M.D.   On: 05/10/2021 00:07      Subjective: No dyspnea or chest pain. No wheezing.  Discharge Exam: Vitals:   05/13/21 1137 05/13/21 1314  BP:  124/74  Pulse:  80  Resp:  20  Temp:  98.1 F (36.7 C)  SpO2: 96% 94%   Vitals:   05/13/21 1010 05/13/21 1020 05/13/21 1137 05/13/21 1314  BP: 123/84 125/87  124/74  Pulse:  84  80  Resp: (!) 22 20  20   Temp:    98.1 F (36.7 C)  TempSrc:    Oral  SpO2: 99% 94% 96% 94%  Weight:      Height:        General: Pt is alert, awake, not in acute distress Cardiovascular: RRR, S1/S2 +, no rubs, no gallops Respiratory: Mild left lower lobe rales, no wheezing, no rhonchi Abdominal: Soft, NT, ND, bowel sounds + Extremities: no edema, no cyanosis    The results of  significant diagnostics from this hospitalization (including imaging, microbiology, ancillary and laboratory) are listed below for reference.     Microbiology: Recent Results (from the past 240 hour(s))  Resp Panel by RT-PCR (Flu A&B, Covid) Nasopharyngeal Swab     Status: None   Collection Time: 05/09/21 11:39 PM   Specimen: Nasopharyngeal Swab; Nasopharyngeal(NP) swabs in vial transport medium  Result Value Ref Range Status   SARS Coronavirus 2 by RT PCR NEGATIVE NEGATIVE Final    Comment: (NOTE) SARS-CoV-2 target nucleic acids are NOT DETECTED.  The SARS-CoV-2 RNA is generally detectable in upper  respiratory specimens during the acute phase of infection. The lowest concentration of SARS-CoV-2 viral copies this assay can detect is 138 copies/mL. A negative result does not preclude SARS-Cov-2 infection and should not be used as the sole basis for treatment or other patient management decisions. A negative result may occur with  improper specimen collection/handling, submission of specimen other than nasopharyngeal swab, presence of viral mutation(s) within the areas targeted by this assay, and inadequate number of viral copies(<138 copies/mL). A negative result must be combined with clinical observations, patient history, and epidemiological information. The expected result is Negative.  Fact Sheet for Patients:  EntrepreneurPulse.com.au  Fact Sheet for Healthcare Providers:  IncredibleEmployment.be  This test is no t yet approved or cleared by the [place] FDA and  has been authorized for detection and/or diagnosis of SARS-CoV-2 by FDA under an Emergency Use Authorization (EUA). This EUA will remain  in effect (meaning this test can be used) for the duration of the COVID-19 declaration under Section 564(b)(1) of the Act, 21 U.S.C.section 360bbb-3(b)(1), unless the authorization is terminated  or revoked sooner.       Influenza A by PCR NEGATIVE NEGATIVE Final   Influenza B by PCR NEGATIVE NEGATIVE Final    Comment: (NOTE) The Xpert Xpress SARS-CoV-2/FLU/RSV plus assay is intended as an aid in the diagnosis of influenza from Nasopharyngeal swab specimens and should not be used as a sole basis for treatment. Nasal washings and aspirates are unacceptable for Xpert Xpress SARS-CoV-2/FLU/RSV testing.  Fact Sheet for Patients: EntrepreneurPulse.com.au  Fact Sheet for Healthcare Providers: IncredibleEmployment.be  This test is not yet approved or cleared by the [place] FDA and has been  authorized for detection and/or diagnosis of SARS-CoV-2 by FDA under an Emergency Use Authorization (EUA). This EUA will remain in effect (meaning this test can be used) for the duration of the COVID-19 declaration under Section 564(b)(1) of the Act, 21 U.S.C. section 360bbb-3(b)(1), unless the authorization is terminated or revoked.  Performed at [name], [place] [address] [place], [place] [address]   MRSA Next Gen by PCR, Nasal     Status: None   Collection Time: 05/10/21  3:27 AM   Specimen: Nasal Mucosa; Nasal Swab  Result Value Ref Range Status   MRSA by PCR Next Gen NOT DETECTED NOT DETECTED Final    Comment: (NOTE) The GeneXpert MRSA Assay (FDA approved for NASAL specimens only), is one component of a comprehensive MRSA colonization surveillance program. It is not intended to diagnose MRSA infection nor to guide or monitor treatment for MRSA infections. Test performance is not FDA approved in patients less than 45 years old. Performed at [name], [place] [address], [place], [place] [address]   C Difficile Quick Screen w PCR reflex     Status: None   Collection Time: 05/10/21  5:58 PM   Specimen: STOOL  Result Value Ref Range  Status   C Diff antigen NEGATIVE NEGATIVE Final   C Diff toxin NEGATIVE NEGATIVE Final   C Diff interpretation No C. difficile detected.  Final    Comment: Performed at [name], [place] [address] [place], [place] [address]     Labs: BNP (last 3 results) No results for input(s): BNP in the last 8760 hours. Basic Metabolic Panel: Recent Labs  Lab 05/09/21 2352 05/10/21 0313 05/11/21 0319  NA 139 140 138  K 4.1 4.2 4.6  CL 109 109 107  CO2 23 21* 24  GLUCOSE 136* 162* 130*  BUN 18 18 20   CREATININE 1.31* 1.37* 1.16  CALCIUM 9.0 9.0 8.9  MG 3.2* 2.3  --   PHOS  --  3.4  --    Liver Function Tests: Recent Labs  Lab 05/09/21 2352 05/10/21 0313 05/11/21 0319  AST 42* 43*  46*  ALT 34 33 32  ALKPHOS 77 69 60  BILITOT 1.0 0.7 1.1  PROT 8.0 7.3 6.7  ALBUMIN 4.3 3.9 3.7   No results for input(s): LIPASE, AMYLASE in the last 168 hours. No results for input(s): AMMONIA in the last 168 hours. CBC: Recent Labs  Lab 05/09/21 2352 05/10/21 0313 05/11/21 0319 05/12/21 1436 05/13/21 0509  WBC 8.7 10.4 11.2* 5.5 8.8  NEUTROABS 6.2 9.4*  --   --   --   HGB 17.6* 16.6 14.9 15.1 15.5  HCT 51.6 49.2 45.1 44.5 46.0  MCV 100.6* 99.8 101.8* 99.6 100.9*  PLT 267 191 187 192 191   Cardiac Enzymes: No results for input(s): CKTOTAL, CKMB, CKMBINDEX, TROPONINI in the last 168 hours. BNP: Invalid input(s): POCBNP CBG: No results for input(s): GLUCAP in the last 168 hours. D-Dimer No results for input(s): DDIMER in the last 72 hours. Hgb A1c No results for input(s): HGBA1C in the last 72 hours. Lipid Profile No results for input(s): CHOL, HDL, LDLCALC, TRIG, CHOLHDL, LDLDIRECT in the last 72 hours. Thyroid function studies No results for input(s): TSH, T4TOTAL, T3FREE, THYROIDAB in the last 72 hours.  Invalid input(s): FREET3 Anemia work up No results for input(s): VITAMINB12, FOLATE, FERRITIN, TIBC, IRON, RETICCTPCT in the last 72 hours. Urinalysis    Component Value Date/Time   COLORURINE AMBER (A) 04/09/2014 0237   APPEARANCEUR CLOUDY (A) 04/09/2014 0237   LABSPEC 1.025 04/09/2014 0237   PHURINE 5.0 04/09/2014 0237   GLUCOSEU NEGATIVE 04/09/2014 0237   HGBUR MODERATE (A) 04/09/2014 0237   BILIRUBINUR SMALL (A) 04/09/2014 0237   KETONESUR 40 (A) 04/09/2014 0237   PROTEINUR NEGATIVE 04/09/2014 0237   UROBILINOGEN 1.0 04/09/2014 0237   NITRITE NEGATIVE 04/09/2014 0237   LEUKOCYTESUR NEGATIVE 04/09/2014 0237   Sepsis Labs Invalid input(s): PROCALCITONIN,  WBC,  LACTICIDVEN Microbiology Recent Results (from the past 240 hour(s))  Resp Panel by RT-PCR (Flu A&B, Covid) Nasopharyngeal Swab     Status: None   Collection Time: 05/09/21 11:39 PM    Specimen: Nasopharyngeal Swab; Nasopharyngeal(NP) swabs in vial transport medium  Result Value Ref Range Status   SARS Coronavirus 2 by RT PCR NEGATIVE NEGATIVE Final    Comment: (NOTE) SARS-CoV-2 target nucleic acids are NOT DETECTED.  The SARS-CoV-2 RNA is generally detectable in upper respiratory specimens during the acute phase of infection. The lowest concentration of SARS-CoV-2 viral copies this assay can detect is 138 copies/mL. A negative result does not preclude SARS-Cov-2 infection and should not be used as the sole basis for treatment or other patient management decisions. A negative result  may occur with  improper specimen collection/handling, submission of specimen other than nasopharyngeal swab, presence of viral mutation(s) within the areas targeted by this assay, and inadequate number of viral copies(<138 copies/mL). A negative result must be combined with clinical observations, patient history, and epidemiological information. The expected result is Negative.  Fact Sheet for Patients:  EntrepreneurPulse.com.au  Fact Sheet for Healthcare Providers:  IncredibleEmployment.be  This test is no t yet approved or cleared by the [place] FDA and  has been authorized for detection and/or diagnosis of SARS-CoV-2 by FDA under an Emergency Use Authorization (EUA). This EUA will remain  in effect (meaning this test can be used) for the duration of the COVID-19 declaration under Section 564(b)(1) of the Act, 21 U.S.C.section 360bbb-3(b)(1), unless the authorization is terminated  or revoked sooner.       Influenza A by PCR NEGATIVE NEGATIVE Final   Influenza B by PCR NEGATIVE NEGATIVE Final    Comment: (NOTE) The Xpert Xpress SARS-CoV-2/FLU/RSV plus assay is intended as an aid in the diagnosis of influenza from Nasopharyngeal swab specimens and should not be used as a sole basis for treatment. Nasal washings and aspirates are  unacceptable for Xpert Xpress SARS-CoV-2/FLU/RSV testing.  Fact Sheet for Patients: EntrepreneurPulse.com.au  Fact Sheet for Healthcare Providers: IncredibleEmployment.be  This test is not yet approved or cleared by the [place] FDA and has been authorized for detection and/or diagnosis of SARS-CoV-2 by FDA under an Emergency Use Authorization (EUA). This EUA will remain in effect (meaning this test can be used) for the duration of the COVID-19 declaration under Section 564(b)(1) of the Act, 21 U.S.C. section 360bbb-3(b)(1), unless the authorization is terminated or revoked.  Performed at [name], [place] [address], [place], [place] [address]   MRSA Next Gen by PCR, Nasal     Status: None   Collection Time: 05/10/21  3:27 AM   Specimen: Nasal Mucosa; Nasal Swab  Result Value Ref Range Status   MRSA by PCR Next Gen NOT DETECTED NOT DETECTED Final    Comment: (NOTE) The GeneXpert MRSA Assay (FDA approved for NASAL specimens only), is one component of a comprehensive MRSA colonization surveillance program. It is not intended to diagnose MRSA infection nor to guide or monitor treatment for MRSA infections. Test performance is not FDA approved in patients less than 82 years old. Performed at [name], [place] [address], [place], [place] [address]   C Difficile Quick Screen w PCR reflex     Status: None   Collection Time: 05/10/21  5:58 PM   Specimen: STOOL  Result Value Ref Range Status   C Diff antigen NEGATIVE NEGATIVE Final   C Diff toxin NEGATIVE NEGATIVE Final   C Diff interpretation No C. difficile detected.  Final    Comment: Performed at [name], [place] [address] [place], [place] [address]     Time coordinating discharge: 35 minutes  SIGNED:   Cordelia Poche, MD [facility] 05/13/2021, 3:49 PM

## 2021-05-13 NOTE — Op Note (Signed)
[facility] Patient Name: Mason [name] Procedure Date: 05/13/2021 MRN: [id number] Attending MD: Lear Ng , MD Date of Birth: 1945-06-30 CSN: [id number] Age: 75 Admit Type: Inpatient Procedure:                Flexible Sigmoidoscopy Indications:              Hematochezia, Follow-up for history of adenomatous                            polyps in the colon Providers:                Lear Ng, MD, Carlyn Reichert, RN, Cherylynn Ridges, Technician, Enrigue Catena, CRNA Referring MD:             hospital team Medicines:                Propofol per Anesthesia, Monitored Anesthesia Care Complications:            No immediate complications. Estimated Blood Loss:     Estimated blood loss: none. Procedure:                Pre-Anesthesia Assessment:                           - Prior to the procedure, a History and Physical                            was performed, and patient medications and                            allergies were reviewed. The patient's tolerance of                            previous anesthesia was also reviewed. The risks                            and benefits of the procedure and the sedation                            options and risks were discussed with the patient.                            All questions were answered, and informed consent                            was obtained. Prior Anticoagulants: The patient has                            taken no previous anticoagulant or antiplatelet                            agents. ASA Grade Assessment: III - A patient with  severe systemic disease. After reviewing the risks                            and benefits, the patient was deemed in                            satisfactory condition to undergo the procedure.                           After obtaining informed consent, the scope was                            passed under direct vision. The  [id number]-[id number]                            ([id number]) Olympus colonoscope was introduced                            through the anus and advanced to the the splenic                            flexure. The flexible sigmoidoscopy was performed                            with difficulty due to large polyp and stool                            present. Successful completion of the procedure was                            aided by lavage and performing the maneuvers                            documented (below) in this report. The patient                            tolerated the procedure well. The quality of the                            bowel preparation was unsatisfactory. Scope In: 8:58:21 AM Scope Out: 9:39:37 AM Total Procedure Duration: 0 hours 41 minutes 16 seconds  Findings:      The perianal and digital rectal examinations were normal.      A 40 mm polyp was found in the sigmoid colon at 25 cm proximal to the       anus. The polyp was pedunculated. The polyp was removed with a hot       snare. Resection and retrieval were complete. Estimated blood loss:       none. To prevent bleeding after the polypectomy, two hemostatic clips       were successfully placed. There was no bleeding during, or at the end,       of the procedure. Area was successfully injected with 3 mL of a 1:10,000       solution of epinephrine for to reduce risk of bleeding prior  to       polypectomy. Estimated blood loss: none.      Multiple small-mouthed diverticula were found in the sigmoid colon and       descending colon.      Internal hemorrhoids were found during retroflexion. The hemorrhoids       were medium-sized.      A tattoo was seen in the sigmoid colon.      Attempted to place an endoloop around the base of the polyp stalk but       due to technical difficulties the loop would not remain closed around       the base of the stalk. [id number] mixture was injected into the base of       the stalk prior to  polypectomy. Two hemoclips were placed at the tip of       the polypectomy stalk to prevent post-polypectomy bleeding. Impression:               - Preparation of the colon was unsatisfactory.                           - One 40 mm polyp in the sigmoid colon at 25 cm                            proximal to the anus, removed with a hot snare.                            Resected and retrieved. Clips were placed. Injected.                           - Diverticulosis in the sigmoid colon and in the                            descending colon.                           - Internal hemorrhoids.                           - A tattoo was seen in the sigmoid colon. Moderate Sedation:      N/A - MAC procedure Recommendation:           - Advance diet as tolerated and full liquid diet.                           - Await pathology results. Procedure Code(s):        --- Professional ---                           [phone], Sigmoidoscopy, flexible; with removal of                            tumor(s), polyp(s), or other lesion(s) by snare                            technique Diagnosis Code(s):        --- Professional ---  Z86.010, Personal history of colonic polyps                           K92.1, Melena (includes Hematochezia)                           K63.5, Polyp of colon                           K64.8, Other hemorrhoids                           K57.30, Diverticulosis of large intestine without                            perforation or abscess without bleeding CPT copyright 2019 American [employer] Association. All rights reserved. The codes documented in this report are preliminary and upon coder review may  be revised to meet current compliance requirements. Lear Ng, MD 05/13/2021 10:06:26 AM This report has been signed electronically. Number of Addenda: 0

## 2021-05-17 LAB — SURGICAL PATHOLOGY

## 2021-05-18 ENCOUNTER — Encounter (HOSPITAL_COMMUNITY): Payer: Self-pay | Admitting: Gastroenterology

## 2021-05-24 DIAGNOSIS — K922 Gastrointestinal hemorrhage, unspecified: Secondary | ICD-10-CM | POA: Diagnosis not present

## 2021-05-24 DIAGNOSIS — Z23 Encounter for immunization: Secondary | ICD-10-CM | POA: Diagnosis not present

## 2021-05-24 DIAGNOSIS — J45901 Unspecified asthma with (acute) exacerbation: Secondary | ICD-10-CM | POA: Diagnosis not present

## 2021-05-24 DIAGNOSIS — J9621 Acute and chronic respiratory failure with hypoxia: Secondary | ICD-10-CM | POA: Diagnosis not present

## 2021-05-24 DIAGNOSIS — J45998 Other asthma: Secondary | ICD-10-CM | POA: Diagnosis not present

## 2021-06-30 IMAGING — CT CT ABD-PELV W/O CM
2 of 4 series · 16 of 46 positions shown, 18 images · non-contrast
Comparison: 08/18/2020

CLINICAL DATA: Acute abdominal pain, history of prior colectomy

EXAM:
CT ABDOMEN AND PELVIS WITHOUT CONTRAST
TECHNIQUE: Multidetector CT imaging of the abdomen and pelvis was performed
following the standard protocol without IV contrast.

[Series 2: axial st · axial · 0.79mm/px · z∈[+1036,+1460]mm · 13 of 95 slices shown, 15 images]
[im 5/95  soft-tissue]
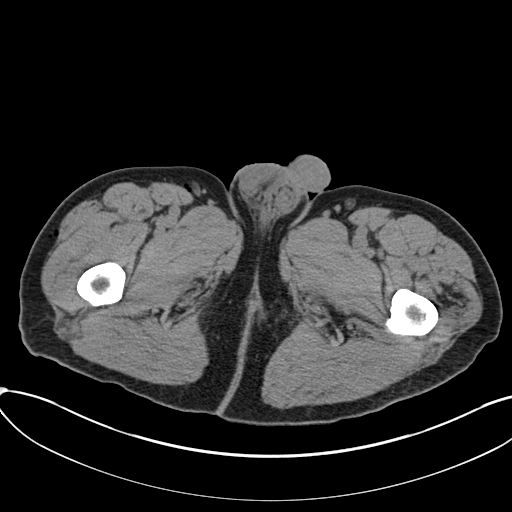
[im 5/95  bone]
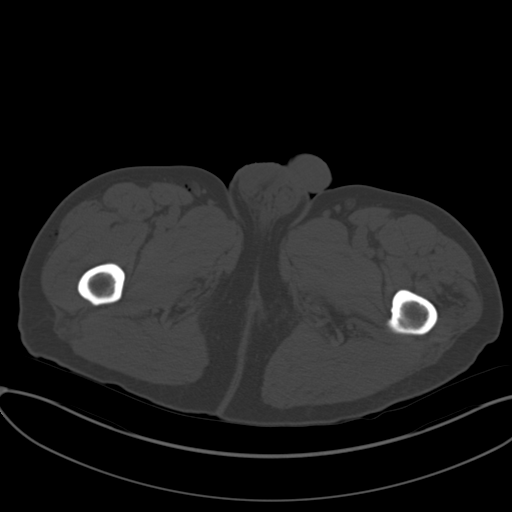
[im 14/95  soft-tissue]
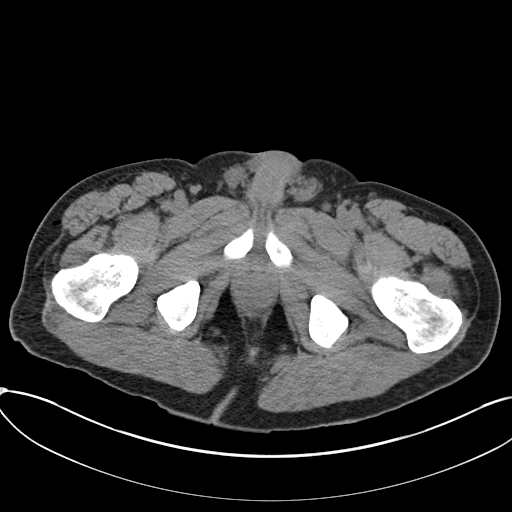
[im 18/95  soft-tissue]
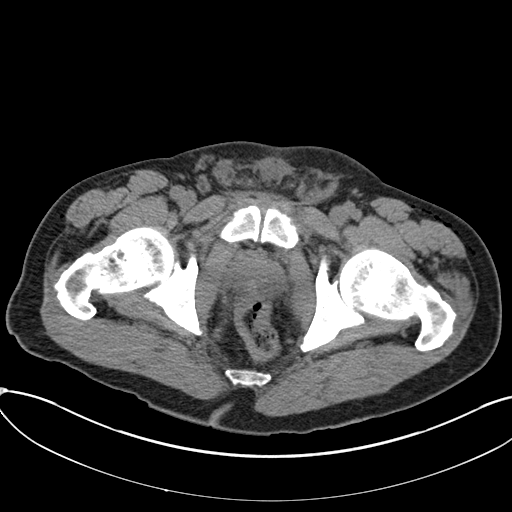
[im 27/95  soft-tissue]
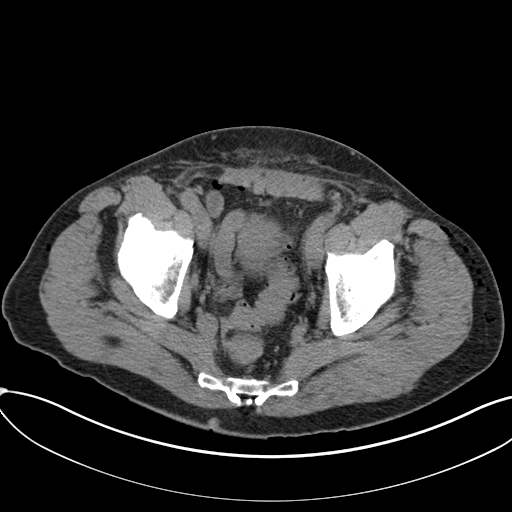
[im 32/95  soft-tissue]
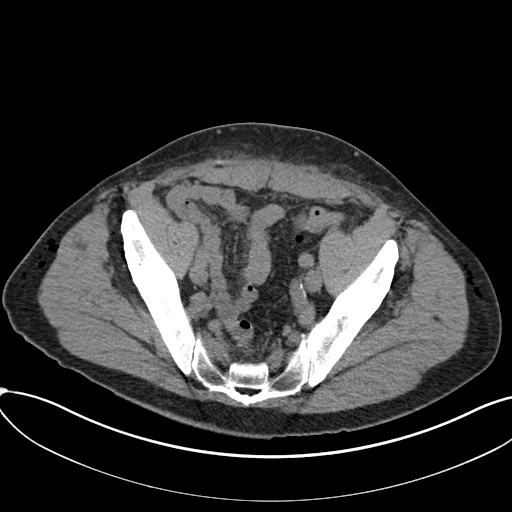
[im 41/95  soft-tissue]
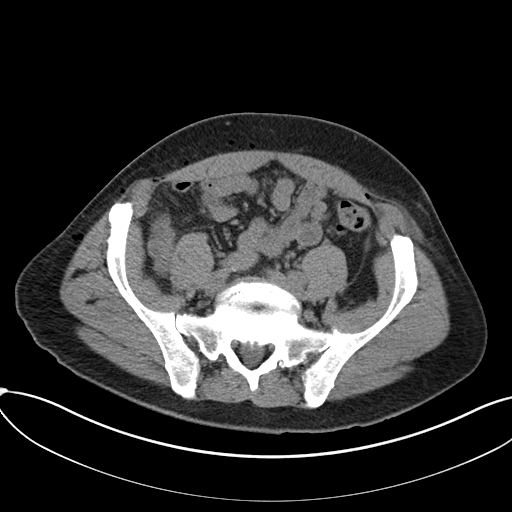
[im 50/95  soft-tissue]
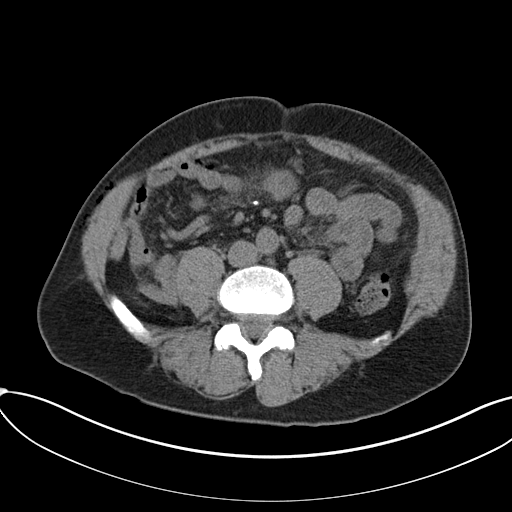
[im 54/95  soft-tissue]
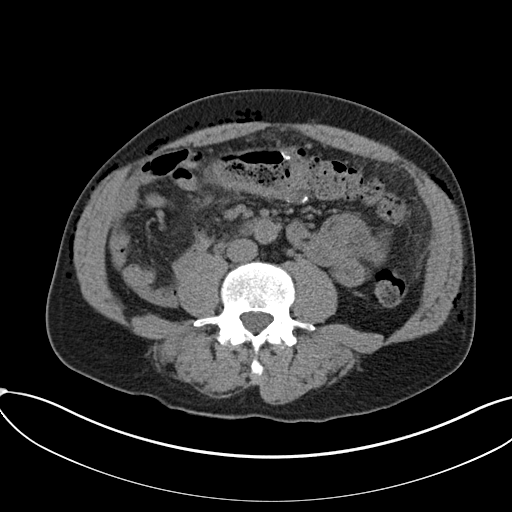
[im 63/95  soft-tissue]
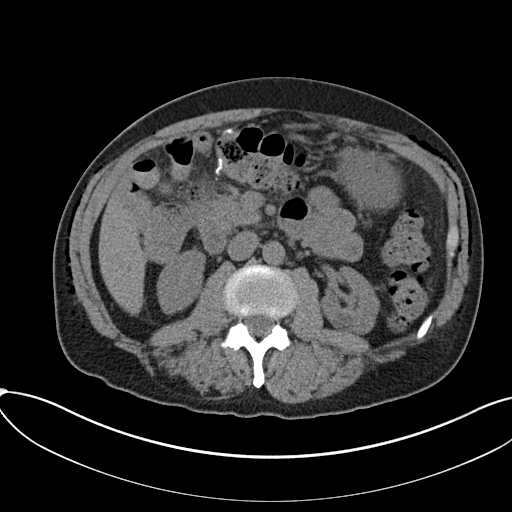
[im 63/95  bone]
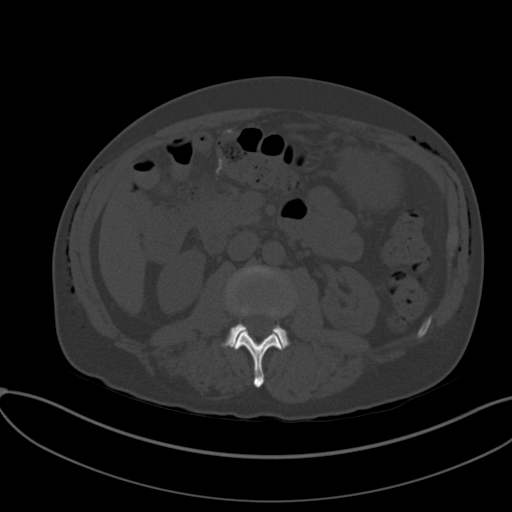
[im 68/95  soft-tissue]
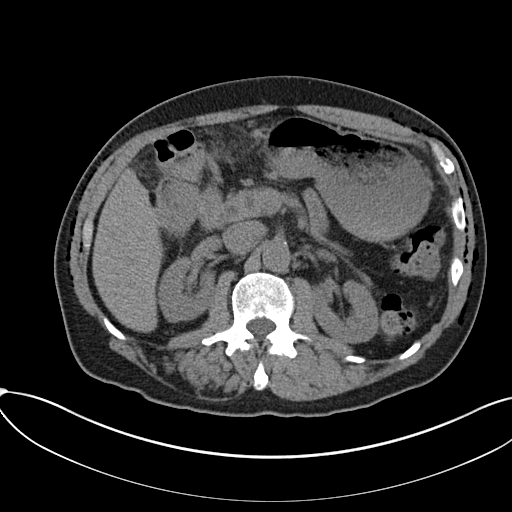
[im 77/95  soft-tissue]
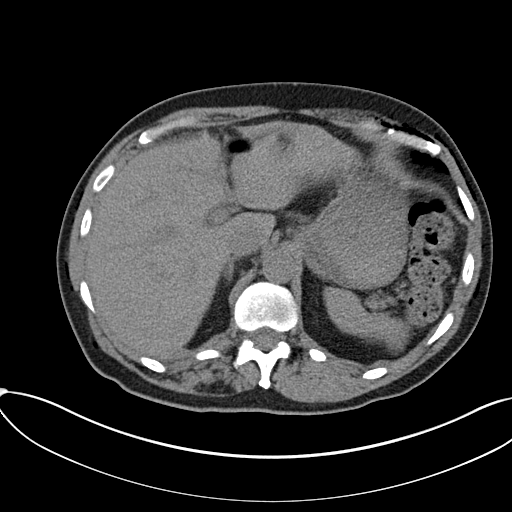
[im 81/95  soft-tissue]
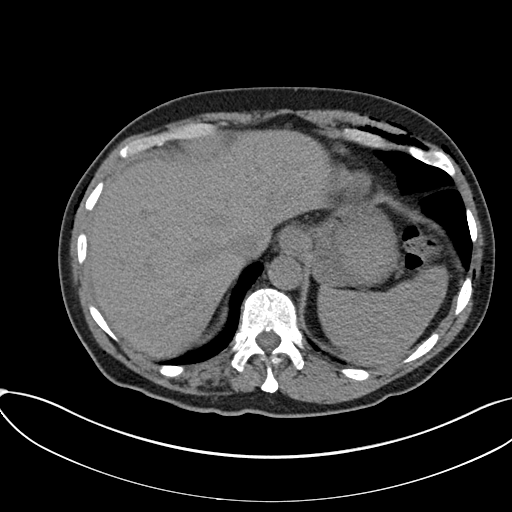
[im 90/95  soft-tissue]
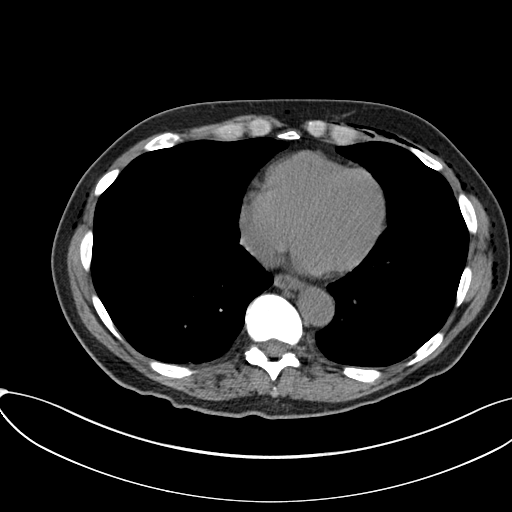

[Series 5: coronal st · coronal · 0.75mm/px · 3 of 146 slices shown]
[im 49/146  soft-tissue]
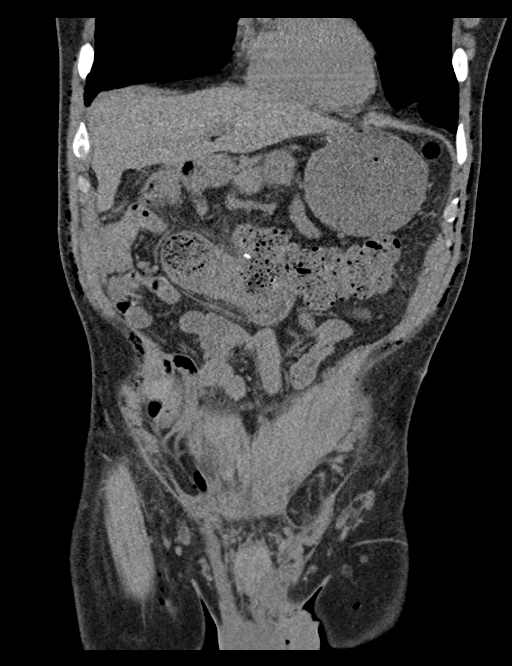
[im 65/146  soft-tissue]
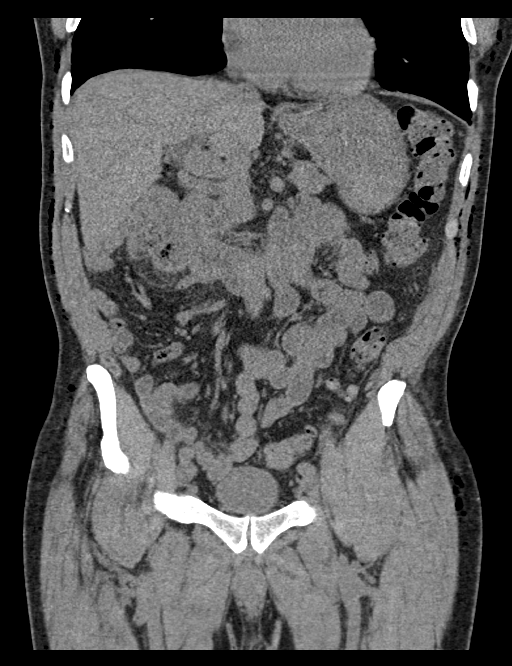
[im 81/146  soft-tissue]
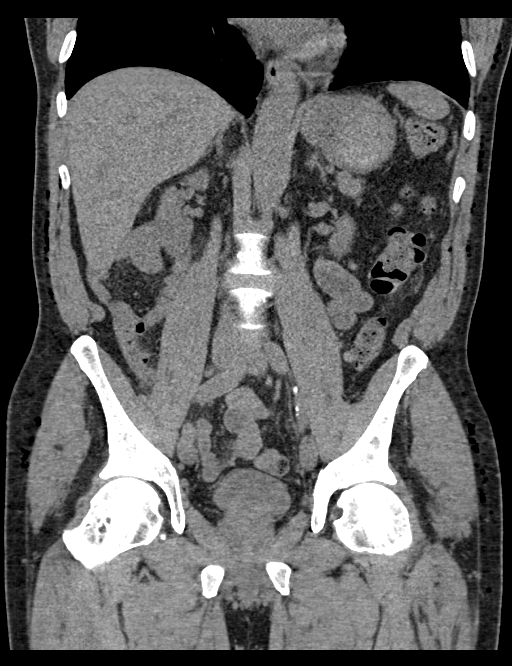

[16 of 46 positions shown; findings below may reference images not displayed]

FINDINGS: Lower chest: No acute abnormality.

Hepatobiliary: No focal liver abnormality is seen. Status post
cholecystectomy. No biliary dilatation.

Pancreas: Unremarkable. No pancreatic ductal dilatation or
surrounding inflammatory changes.

Spleen: Normal in size without focal abnormality.

Adrenals/Urinary Tract: Adrenal glands are within normal limits.
Kidneys demonstrate no renal calculi or obstructive changes. The
bladder is decompressed.

Stomach/Bowel: Scattered diverticular change of the colon is noted.
No diverticulitis is seen. There is a side to side anastomosis
between the distal ileum and mid transverse colon. It is widely
patent. Some fecalization of bowel contents is noted in the distal
ileum. No obstructive changes are seen. The small bowel is otherwise
unremarkable. Stomach is within normal limits.

Vascular/Lymphatic: Aortic atherosclerosis. No enlarged abdominal or
pelvic lymph nodes.

Reproductive: Prostate is unremarkable.

Other: Minimal free fluid is noted felt to be related to the recent
surgery. Considerable amount of subcutaneous air is noted also felt
to be related to recent laparoscopic colectomy no free
intraperitoneal air is seen. No focal abscess is identified.

Musculoskeletal: Degenerative changes of lumbar spine are noted. No
acute bony abnormality is seen.
IMPRESSION: Changes consistent with the known history of prior right colectomy
with laparoscopic guidance. Patent anastomosis is seen. Minimal
fluid in the abdomen as well as air in the subcutaneous tissues of
the abdominal wall are noted consistent with the recent surgery.

No abscess is identified.

Diverticulosis without diverticulitis.

No other focal abnormality is noted.

## 2021-07-14 ENCOUNTER — Ambulatory Visit: Payer: Medicare Other | Admitting: Allergy & Immunology

## 2021-07-14 DIAGNOSIS — R069 Unspecified abnormalities of breathing: Secondary | ICD-10-CM | POA: Diagnosis not present

## 2021-07-14 DIAGNOSIS — R404 Transient alteration of awareness: Secondary | ICD-10-CM | POA: Diagnosis not present

## 2021-07-14 DIAGNOSIS — Z743 Need for continuous supervision: Secondary | ICD-10-CM | POA: Diagnosis not present

## 2021-07-17 DIAGNOSIS — Z743 Need for continuous supervision: Secondary | ICD-10-CM | POA: Diagnosis not present

## 2021-07-20 DIAGNOSIS — 419620001 Death: Secondary | SNOMED CT | POA: Diagnosis not present

## 2021-07-20 DEATH — deceased
